# Patient Record
Sex: Male | Born: 1955
Health system: Southern US, Community
[De-identification: ages and names within clinical notes are randomized; demographics above are authoritative.]

## PROBLEM LIST (undated history)

## (undated) DIAGNOSIS — I639 Cerebral infarction, unspecified: Secondary | ICD-10-CM

## (undated) DIAGNOSIS — I4892 Unspecified atrial flutter: Secondary | ICD-10-CM

## (undated) DIAGNOSIS — D759 Disease of blood and blood-forming organs, unspecified: Secondary | ICD-10-CM

## (undated) DIAGNOSIS — E785 Hyperlipidemia, unspecified: Secondary | ICD-10-CM

## (undated) DIAGNOSIS — R42 Dizziness and giddiness: Secondary | ICD-10-CM

## (undated) DIAGNOSIS — I4891 Unspecified atrial fibrillation: Secondary | ICD-10-CM

## (undated) DIAGNOSIS — I10 Essential (primary) hypertension: Secondary | ICD-10-CM

## (undated) DIAGNOSIS — I2510 Atherosclerotic heart disease of native coronary artery without angina pectoris: Secondary | ICD-10-CM

## (undated) DIAGNOSIS — I209 Angina pectoris, unspecified: Secondary | ICD-10-CM

## (undated) DIAGNOSIS — I739 Peripheral vascular disease, unspecified: Secondary | ICD-10-CM

## (undated) HISTORY — DX: Hyperlipidemia, unspecified: E78.5

## (undated) HISTORY — DX: Atherosclerotic heart disease of native coronary artery without angina pectoris: I25.10

## (undated) HISTORY — DX: Peripheral vascular disease, unspecified: I73.9

## (undated) HISTORY — DX: Gilbert syndrome: E80.4

---

## 2002-06-05 HISTORY — PX: ACHILLES TENDON REPAIR: SUR1153

## 2003-09-10 ENCOUNTER — Ambulatory Visit (HOSPITAL_COMMUNITY): Admission: RE | Admit: 2003-09-10 | Discharge: 2003-09-10 | Payer: Self-pay | Admitting: Family Medicine

## 2005-09-05 DIAGNOSIS — I4892 Unspecified atrial flutter: Secondary | ICD-10-CM

## 2005-09-05 HISTORY — DX: Unspecified atrial flutter: I48.92

## 2005-09-05 HISTORY — PX: CARDIOVERSION: SHX1299

## 2007-09-11 ENCOUNTER — Emergency Department (HOSPITAL_COMMUNITY): Admission: EM | Admit: 2007-09-11 | Discharge: 2007-09-11 | Payer: Self-pay | Admitting: Emergency Medicine

## 2008-07-25 ENCOUNTER — Emergency Department (HOSPITAL_COMMUNITY): Admission: EM | Admit: 2008-07-25 | Discharge: 2008-07-25 | Payer: Self-pay | Admitting: Emergency Medicine

## 2008-07-26 ENCOUNTER — Ambulatory Visit (HOSPITAL_COMMUNITY): Admission: RE | Admit: 2008-07-26 | Discharge: 2008-07-26 | Payer: Self-pay | Admitting: Emergency Medicine

## 2008-10-31 ENCOUNTER — Observation Stay (HOSPITAL_COMMUNITY): Admission: EM | Admit: 2008-10-31 | Discharge: 2008-11-05 | Payer: Self-pay | Admitting: Emergency Medicine

## 2008-10-31 ENCOUNTER — Ambulatory Visit: Payer: Self-pay | Admitting: Cardiology

## 2008-11-19 ENCOUNTER — Ambulatory Visit: Payer: Self-pay | Admitting: Cardiology

## 2008-12-19 ENCOUNTER — Ambulatory Visit: Payer: Self-pay | Admitting: Cardiology

## 2008-12-25 ENCOUNTER — Ambulatory Visit: Payer: Self-pay | Admitting: Cardiology

## 2009-01-01 ENCOUNTER — Ambulatory Visit: Payer: Self-pay | Admitting: Cardiology

## 2009-01-08 ENCOUNTER — Ambulatory Visit: Payer: Self-pay | Admitting: Cardiology

## 2009-01-22 ENCOUNTER — Ambulatory Visit: Payer: Self-pay | Admitting: Cardiology

## 2009-01-29 ENCOUNTER — Ambulatory Visit: Payer: Self-pay | Admitting: Cardiology

## 2009-02-10 ENCOUNTER — Ambulatory Visit: Payer: Self-pay | Admitting: Cardiology

## 2009-02-10 ENCOUNTER — Encounter: Payer: Self-pay | Admitting: Physician Assistant

## 2009-02-10 DIAGNOSIS — E119 Type 2 diabetes mellitus without complications: Secondary | ICD-10-CM | POA: Insufficient documentation

## 2009-02-10 DIAGNOSIS — I4891 Unspecified atrial fibrillation: Secondary | ICD-10-CM | POA: Insufficient documentation

## 2009-02-10 DIAGNOSIS — E785 Hyperlipidemia, unspecified: Secondary | ICD-10-CM | POA: Insufficient documentation

## 2009-02-10 DIAGNOSIS — I6522 Occlusion and stenosis of left carotid artery: Secondary | ICD-10-CM | POA: Insufficient documentation

## 2009-02-11 ENCOUNTER — Telehealth: Payer: Self-pay | Admitting: Cardiology

## 2009-02-12 ENCOUNTER — Encounter (INDEPENDENT_AMBULATORY_CARE_PROVIDER_SITE_OTHER): Payer: Self-pay | Admitting: *Deleted

## 2009-02-17 ENCOUNTER — Ambulatory Visit (HOSPITAL_COMMUNITY): Admission: RE | Admit: 2009-02-17 | Discharge: 2009-02-17 | Payer: Self-pay | Admitting: Cardiology

## 2009-02-17 ENCOUNTER — Encounter: Payer: Self-pay | Admitting: Cardiology

## 2009-02-17 ENCOUNTER — Ambulatory Visit: Payer: Self-pay | Admitting: Internal Medicine

## 2009-02-17 ENCOUNTER — Encounter (INDEPENDENT_AMBULATORY_CARE_PROVIDER_SITE_OTHER): Payer: Self-pay | Admitting: *Deleted

## 2009-02-17 LAB — CONVERTED CEMR LAB
CO2: 24 meq/L
Calcium: 8.8 mg/dL
Sodium: 142 meq/L

## 2009-04-06 ENCOUNTER — Encounter: Payer: Self-pay | Admitting: Cardiology

## 2009-04-20 ENCOUNTER — Encounter: Payer: Self-pay | Admitting: *Deleted

## 2009-05-27 ENCOUNTER — Encounter (INDEPENDENT_AMBULATORY_CARE_PROVIDER_SITE_OTHER): Payer: Self-pay | Admitting: Cardiology

## 2009-09-29 ENCOUNTER — Telehealth (INDEPENDENT_AMBULATORY_CARE_PROVIDER_SITE_OTHER): Payer: Self-pay | Admitting: *Deleted

## 2009-09-30 ENCOUNTER — Ambulatory Visit: Payer: Self-pay | Admitting: Cardiology

## 2009-09-30 ENCOUNTER — Encounter (INDEPENDENT_AMBULATORY_CARE_PROVIDER_SITE_OTHER): Payer: Self-pay | Admitting: *Deleted

## 2009-09-30 LAB — CONVERTED CEMR LAB: POC INR: 1.2

## 2009-10-12 ENCOUNTER — Encounter (INDEPENDENT_AMBULATORY_CARE_PROVIDER_SITE_OTHER): Payer: Self-pay | Admitting: Cardiology

## 2009-10-29 ENCOUNTER — Encounter (INDEPENDENT_AMBULATORY_CARE_PROVIDER_SITE_OTHER): Payer: Self-pay | Admitting: Cardiology

## 2009-12-30 ENCOUNTER — Encounter (INDEPENDENT_AMBULATORY_CARE_PROVIDER_SITE_OTHER): Payer: Self-pay | Admitting: Pharmacist

## 2010-01-25 ENCOUNTER — Encounter (INDEPENDENT_AMBULATORY_CARE_PROVIDER_SITE_OTHER): Payer: Self-pay | Admitting: *Deleted

## 2010-03-03 ENCOUNTER — Encounter (INDEPENDENT_AMBULATORY_CARE_PROVIDER_SITE_OTHER): Payer: Self-pay | Admitting: *Deleted

## 2010-03-04 ENCOUNTER — Encounter (INDEPENDENT_AMBULATORY_CARE_PROVIDER_SITE_OTHER): Payer: Self-pay | Admitting: Pharmacist

## 2010-03-09 ENCOUNTER — Encounter (INDEPENDENT_AMBULATORY_CARE_PROVIDER_SITE_OTHER): Payer: Self-pay | Admitting: *Deleted

## 2010-04-07 ENCOUNTER — Encounter (INDEPENDENT_AMBULATORY_CARE_PROVIDER_SITE_OTHER): Payer: Self-pay | Admitting: Pharmacist

## 2010-04-21 ENCOUNTER — Emergency Department (HOSPITAL_COMMUNITY): Admission: EM | Admit: 2010-04-21 | Discharge: 2010-04-21 | Payer: Self-pay | Admitting: Emergency Medicine

## 2010-04-21 ENCOUNTER — Encounter (INDEPENDENT_AMBULATORY_CARE_PROVIDER_SITE_OTHER): Payer: Self-pay | Admitting: Pharmacist

## 2010-04-26 ENCOUNTER — Emergency Department (HOSPITAL_COMMUNITY): Admission: EM | Admit: 2010-04-26 | Discharge: 2010-04-26 | Payer: Self-pay | Admitting: Emergency Medicine

## 2010-05-19 ENCOUNTER — Encounter (INDEPENDENT_AMBULATORY_CARE_PROVIDER_SITE_OTHER): Payer: Self-pay | Admitting: Pharmacist

## 2010-08-20 ENCOUNTER — Emergency Department (HOSPITAL_COMMUNITY)
Admission: EM | Admit: 2010-08-20 | Discharge: 2010-08-20 | Payer: Self-pay | Source: Home / Self Care | Admitting: Emergency Medicine

## 2010-09-30 ENCOUNTER — Ambulatory Visit: Admit: 2010-09-30 | Payer: Self-pay | Admitting: Cardiology

## 2010-09-30 ENCOUNTER — Ambulatory Visit: Admit: 2010-09-30 | Payer: Self-pay | Admitting: Adult Health

## 2010-10-01 ENCOUNTER — Encounter (INDEPENDENT_AMBULATORY_CARE_PROVIDER_SITE_OTHER): Payer: Self-pay | Admitting: *Deleted

## 2010-10-05 NOTE — Letter (Signed)
Summary: Appointment - Missed  [facility] [place]  [address], [place] [address]   Phone: [phone]  Fax: [phone]     Jan 25, 2010 MRN: [id number]   [name] [address] [place], [place]  [address]   Dear [name],  Our records indicate you missed your appointment on        01/25/10                with [facility]IN [facility]     .                                    It is very important that we reach you to reschedule this appointment. We look forward to participating in your health care needs. Please contact us at the number listed above at your earliest convenience to reschedule this appointment.     Sincerely,    Glass blower/designer

## 2010-10-05 NOTE — Progress Notes (Signed)
Summary: HR 170/66  Phone Note Call from Patient Call back at Home Phone [phone]   Caller: pt Reason for Call: Talk to Nurse Summary of Call: pt HR was 170/66 last night got sent home from work and would like to be seen today. Initial call taken by: Faythe Ghee,  September 29, 2009 9:24 AM  Follow-up for Phone Call        pt has missed his 1 week follow up in June 2010, has been having increased bp hr 80-100 irregular, wanted to be seen today, scheduled an appt and told pt to go see pcp today. Follow-up by: Teressa Lower RN,  September 29, 2009 10:12 AM

## 2010-10-05 NOTE — Letter (Signed)
Summary: Appointment - Missed  [facility] [place]  [address] [place] [address]   Phone: [phone]  Fax: [phone]     March 03, 2010 MRN: [id number]   [name] [address] [place], [place]  [address]   Dear [name],  Our records indicate you missed your appointment on     03/03/10 COU[facility]IN CLINIC     It is very important that we reach you to reschedule this appointment. We look forward to participating in your health care needs. Please contact us at the number listed above at your earliest convenience to reschedule this appointment.     Sincerely,    Glass blower/designer

## 2010-10-05 NOTE — Letter (Signed)
Summary: Custom - Delinquent Coumadin 1  [facility]  [address] [place] [address]   Phone: [phone]  Fax: [phone]     October 29, 2009 MRN: [id number]   [name] [address] [place], [place]  [address]   Dear [name],  This letter is being sent to you as a reminder that it is necessary for you to get your INR/PT checked regularly so that we can optimize your care.  Our records indicate that you were scheduled to have a test done recently.  As of today, we have not received the results of this test.  It is very important that you have your INR checked.  Please call our office at the number listed above to schedule an appointment at your earliest convenience.    If you have recently had your protime checked or have discontinued this medication, please contact our office at the above phone number to clarify this issue.  Thank you for this prompt attention to this important health care matter.  Sincerely, [name] RN   [facility]

## 2010-10-05 NOTE — Letter (Signed)
Summary: Custom - Delinquent Coumadin 2  [facility]  [address] [place] [address]   Phone: [phone]  Fax: [phone]     March 04, 2010 MRN: [id number]   LADD CEN [address] [place], [place]  [address]   Dear Mr. Rogowski,  We have attempted to contact you by phone and letter on multiple occasions to contact our office for important blood work associated with the blood thinner, warfarin (Coumadin).  Warfarin is a very important drug that can cause life threatening side effects including, bleeding, and thus requires close laboratory monitoring.  We are unable to accept responsibility for blood thinner-related health problems you may develop because you have not followed our recommendations for appropriate monitoring.  These may include abnormal bleeding occurrences and/or development of blood clots (stroke, heart attack, blood clots in legs or lungs, etc.).  We need for you to contact this office at the number listed above to schedule and complete this very important blood work.  Thank you for your assistance in this urgent matter.  Sincerely, [name] RN [facility]

## 2010-10-05 NOTE — Letter (Signed)
Summary: Custom - Delinquent Coumadin 1  [facility]  [address] [place] [address]   Phone: [phone]  Fax: [phone]     October 12, 2009 MRN: [id number]   [name] [address] [place], [place]  [address]   Dear Mr. Findlay,  This letter is being sent to you as a reminder that it is necessary for you to get your INR/PT checked regularly so that we can optimize your care.  Our records indicate that you were scheduled to have a test done recently.  As of today, we have not received the results of this test.  It is very important that you have your INR checked.  Please call our office at the number listed above to schedule an appointment at your earliest convenience.    If you have recently had your protime checked or have discontinued this medication, please contact our office at the above phone number to clarify this issue.  Thank you for this prompt attention to this important health care matter.  Sincerely, [name] RN  [facility]

## 2010-10-05 NOTE — Letter (Signed)
Summary: Custom - Delinquent Coumadin 2  [facility]  [address] [place] [address]   Phone: [phone]  Fax: [phone]     April 21, 2010 MRN: [id number]   [name] [address] [place], [place]  [address]   Dear [name],  We have attempted to contact you by phone and letter on multiple occasions to contact our office for important blood work associated with the blood thinner, warfarin (Coumadin).  Warfarin is a very important drug that can cause life threatening side effects including, bleeding, and thus requires close laboratory monitoring.  We are unable to accept responsibility for blood thinner-related health problems you may develop because you have not followed our recommendations for appropriate monitoring.  These may include abnormal bleeding occurrences and/or development of blood clots (stroke, heart attack, blood clots in legs or lungs, etc.).  We need for you to contact this office at the number listed above to schedule and complete this very important blood work.  Thank you for your assistance in this urgent matter.  Sincerely, [name], RN [facility]

## 2010-10-05 NOTE — Letter (Signed)
Summary: Custom - Delinquent Coumadin 2  [facility]  [address], [place] [address]   Phone: [phone]  Fax: [phone]     December 30, 2009 MRN: [id number]   [name] [address] [place], [place]  [address]   Dear [name],  We have attempted to contact you by phone and letter on multiple occasions to contact our office for important blood work associated with the blood thinner, warfarin (Coumadin).  Warfarin is a very important drug that can cause life threatening side effects including, bleeding, and thus requires close laboratory monitoring.  We are unable to accept responsibility for blood thinner-related health problems you may develop because you have not followed our recommendations for appropriate monitoring.  These may include abnormal bleeding occurrences and/or development of blood clots (stroke, heart attack, blood clots in legs or lungs, etc.).  We need for you to contact this office at the number listed above to schedule and complete this very important blood work.  Thank you for your assistance in this urgent matter.  Sincerely, [name], RN [facility]  If you want [place] to continue to refill your coumadin, you must come in for regular INR checks.

## 2010-10-05 NOTE — Medication Information (Signed)
Summary: protime/pt last seen 8/10/tg  Anticoagulant Therapy  Managed by: [name], RN PCP: Daryl Eastern MD: Diona Browner MD, Remi Deter Indication 1: Atrial Fibrillation (ICD-427.31) Lab Used: [facility] Site: [place] INR POC 1.2  Dietary changes: no    Health status changes: no    Bleeding/hemorrhagic complications: no    Recent/future hospitalizations: no    Any changes in medication regimen? no    Recent/future dental: no  Any missed doses?: no       Is patient compliant with meds? no     Details: Has not had coumadin checked since 8/11     Comments: Pt states he has been taking coumadin 20mg  once daily but INR 1.2.  After questioning pt he admits to missing doses and not taking regularly.  In looking back at [facility] record his INR was never within range before.  Have counseled pt on improtance of taking coumadin regularly and having regular INR checks.  Food and drug interactions discussed with pt as well.  Literature re. caoumadin management given to pt.  Allergies: No Known Drug Allergies  Anticoagulation Management History:      The patient is taking warfarin and comes in today for a routine follow up visit.  Positive risk factors for bleeding include presence of serious comorbidities.  Negative risk factors for bleeding include an age less than 21 years old.  The bleeding index is 'intermediate risk'.  Positive CHADS2 values include History of Diabetes.  Negative CHADS2 values include Age > 63 years old.  The start date was 09/29/2008.  Anticoagulation responsible provider: Diona Browner MD, Remi Deter.  INR POC: 1.2.  Cuvette Lot#: [id number].    Anticoagulation Management Assessment/Plan:      The patient's current anticoagulation dose is Warfarin sodium 10 mg tabs: as directed.  The target INR is 2 - 3.  The next INR is due 10/08/2009.  Anticoagulation instructions were given to patient.  Results were reviewed/authorized by [name],  RN.  He was notified by [name] RN.         Prior Anticoagulation Instructions: 15 mg daily  Current Anticoagulation Instructions: INR 1.2 Pt has been non-compliant Will start pt from scratch since I do not know how pt has been taking coumadin Take couamdin 1 1/2 tablets once daily  Recheck in 1 week Prescriptions: WARFARIN SODIUM 10 MG TABS (WARFARIN SODIUM) as directed  #60 x 3   Entered by:   [name] RN   Authorized by:   Loreli Slot, MD, [facility]   Signed by:   [name] RN on 09/30/2009   Method used:   Electronically to        [facility]   [address] (retail)       [address]       [place], [place]  [address]       Ph: [phone]       Fax: [phone]   RxID:   [id number]

## 2010-10-05 NOTE — Letter (Signed)
Summary: Appointment - Missed   [facility]  [address], [place] [address]   Phone: [phone]  Fax: [phone]     March 09, 2010 MRN: [id number]   [name] [address] [place], [place]  [address]   Dear [name],  Our records indicate you missed your appointment on     03/09/10 [name] NP                It is very important that we reach you to reschedule this appointment. We look forward to participating in your health care needs. Please contact us at the number listed above at your earliest convenience to reschedule this appointment.     Sincerely,    Glass blower/designer

## 2010-10-05 NOTE — Letter (Signed)
Summary: Custom - Delinquent Coumadin 2  [facility]  [address] [place] [address]   Phone: [phone]  Fax: [phone]     April 07, 2010 MRN: [id number]   ACESON LABELL [address] [place], [place]  [address]   Dear Mr. Vignola,  We have attempted to contact you by phone and letter on multiple occasions to contact our office for important blood work associated with the blood thinner, warfarin (Coumadin).  Warfarin is a very important drug that can cause life threatening side effects including, bleeding, and thus requires close laboratory monitoring.  We are unable to accept responsibility for blood thinner-related health problems you may develop because you have not followed our recommendations for appropriate monitoring.  These may include abnormal bleeding occurrences and/or development of blood clots (stroke, heart attack, blood clots in legs or lungs, etc.).  We need for you to contact this office at the number listed above to schedule and complete this very important blood work.  Thank you for your assistance in this urgent matter.  Sincerely, [name] RN [facility]   PLEASE CALL OUR OFFICE REGARDING THIS ISSUE.

## 2010-10-05 NOTE — Letter (Signed)
Summary: Custom - Delinquent Coumadin 2  [facility]  [address] [place] [address]   Phone: [phone]  Fax: [phone]     May 19, 2010 MRN: [id number]   [name] [address] [place], [place]  [address]   Dear Mr. Patrie,  We have attempted to contact you by phone and letter on multiple occasions to contact our office for important blood work associated with the blood thinner, warfarin (Coumadin).  Warfarin is a very important drug that can cause life threatening side effects including, bleeding, and thus requires close laboratory monitoring.  We are unable to accept responsibility for blood thinner-related health problems you may develop because you have not followed our recommendations for appropriate monitoring.  These may include abnormal bleeding occurrences and/or development of blood clots (stroke, heart attack, blood clots in legs or lungs, etc.).  We need for you to contact this office at the number listed above to schedule and complete this very important blood work.  Thank you for your assistance in this urgent matter.  Sincerely, [name] RN [facility]

## 2010-10-05 NOTE — Miscellaneous (Signed)
Summary: LABS BMP,02/17/2009  Clinical Lists Changes  Observations: Added new observation of CALCIUM: 8.8 mg/dL (16/06/9603 54:09) Added new observation of CREATININE: 1.08 mg/dL (81/19/1478 29:56) Added new observation of BUN: 15 mg/dL (21/30/8657 84:69) Added new observation of BG RANDOM: 95 mg/dL (62/95/2841 32:44) Added new observation of CO2 PLSM/SER: 24 meq/L (02/17/2009 10:15) Added new observation of CL SERUM: 109 meq/L (02/17/2009 10:15) Added new observation of K SERUM: 4.5 meq/L (02/17/2009 10:15) Added new observation of NA: 142 meq/L (02/17/2009 10:15)

## 2010-10-07 NOTE — Letter (Signed)
Summary: Appointment - Missed  [facility] [place]  [address] [place] [address]   Phone: [phone]  Fax: [phone]     October 01, 2010 MRN: [id number]   [name] [address] [place], [place]  [address]   Dear [name],  Our records indicate you missed your appointment on     09/30/10 [name] NP                It is very important that we reach you to reschedule this appointment. We look forward to participating in your health care needs. Please contact us at the number listed above at your earliest convenience to reschedule this appointment.     Sincerely,    Glass blower/designer

## 2010-11-15 LAB — PROTIME-INR: INR: 1.02 (ref 0.00–1.49)

## 2010-11-15 LAB — DIFFERENTIAL
Basophils Relative: 0 % (ref 0–1)
Eosinophils Absolute: 0.1 10*3/uL (ref 0.0–0.7)
Eosinophils Relative: 1 % (ref 0–5)
Lymphs Abs: 1.8 10*3/uL (ref 0.7–4.0)
Monocytes Relative: 7 % (ref 3–12)
Neutrophils Relative %: 74 % (ref 43–77)

## 2010-11-15 LAB — URINALYSIS, ROUTINE W REFLEX MICROSCOPIC
Nitrite: NEGATIVE
Protein, ur: NEGATIVE mg/dL
Specific Gravity, Urine: 1.025 (ref 1.005–1.030)
Urobilinogen, UA: 1 mg/dL (ref 0.0–1.0)

## 2010-11-15 LAB — BASIC METABOLIC PANEL
CO2: 28 mEq/L (ref 19–32)
Calcium: 10.1 mg/dL (ref 8.4–10.5)
Chloride: 106 mEq/L (ref 96–112)
Creatinine, Ser: 1.28 mg/dL (ref 0.4–1.5)
Glucose, Bld: 87 mg/dL (ref 70–99)

## 2010-11-15 LAB — CBC
Hemoglobin: 16.5 g/dL (ref 13.0–17.0)
MCH: 31.2 pg (ref 26.0–34.0)
MCHC: 35.3 g/dL (ref 30.0–36.0)
MCV: 88.5 fL (ref 78.0–100.0)
Platelets: 219 10*3/uL (ref 150–400)

## 2010-11-15 LAB — GLUCOSE, CAPILLARY: Glucose-Capillary: 95 mg/dL (ref 70–99)

## 2010-11-15 LAB — POCT CARDIAC MARKERS: Myoglobin, poc: 119 ng/mL (ref 12–200)

## 2010-11-17 ENCOUNTER — Encounter: Payer: Self-pay | Admitting: Pharmacist

## 2010-11-19 LAB — PROTIME-INR
INR: 1.09 (ref 0.00–1.49)
Prothrombin Time: 14.3 seconds (ref 11.6–15.2)

## 2010-11-23 ENCOUNTER — Encounter: Payer: Self-pay | Admitting: Adult Health

## 2010-11-23 ENCOUNTER — Other Ambulatory Visit: Payer: Self-pay | Admitting: Adult Health

## 2010-11-23 ENCOUNTER — Ambulatory Visit (INDEPENDENT_AMBULATORY_CARE_PROVIDER_SITE_OTHER): Payer: BC Managed Care – PPO | Admitting: Adult Health

## 2010-11-23 DIAGNOSIS — Z7901 Long term (current) use of anticoagulants: Secondary | ICD-10-CM

## 2010-11-23 DIAGNOSIS — E119 Type 2 diabetes mellitus without complications: Secondary | ICD-10-CM

## 2010-11-23 DIAGNOSIS — R609 Edema, unspecified: Secondary | ICD-10-CM

## 2010-11-23 DIAGNOSIS — I4891 Unspecified atrial fibrillation: Secondary | ICD-10-CM

## 2010-11-23 DIAGNOSIS — E785 Hyperlipidemia, unspecified: Secondary | ICD-10-CM

## 2010-11-23 MED ORDER — WARFARIN SODIUM 10 MG PO TABS: 10.0000 mg | ORAL_TABLET | Freq: Every day | ORAL | Status: DC

## 2010-11-23 MED ORDER — FUROSEMIDE 20 MG PO TABS: 20.0000 mg | ORAL_TABLET | Freq: Every day | ORAL | Status: DC

## 2010-11-23 MED ORDER — METOPROLOL SUCCINATE ER 50 MG PO TB24: 50.0000 mg | ORAL_TABLET | Freq: Two times a day (BID) | ORAL | Status: DC

## 2010-11-23 NOTE — Assessment & Plan Note (Signed)
Heart rate is not well controlled at present.  Will increase his beta blocker only, to metoprolol 50 mg 1 1/2 tables BID. He will have follow-up echo cardiogram and stress myoview secondary to multiple CVRF.  He wishes to start back on coumadin.  Will begin at 10mg  daily with follow-up at the end of the week in coumadin clinic.

## 2010-11-23 NOTE — Assessment & Plan Note (Signed)
Will check fasting lipids and LFT's with labs ordered.  He is advised on low cholesterol diet and to increase exercise to lose weight

## 2010-11-23 NOTE — Letter (Signed)
Summary: Custom - Delinquent Coumadin 2  [facility]  [address], [place] [address]   Phone: [phone]  Fax: [phone]     November 17, 2010 MRN: [id number]   [name] [address] [place], [place]  [address]   Dear Mr. Hartsock,  We have attempted to contact you by phone and letter on multiple occasions to contact our office for important blood work associated with the blood thinner, warfarin (Coumadin).  Warfarin is a very important drug that can cause life threatening side effects including, bleeding, and thus requires close laboratory monitoring.  We are unable to accept responsibility for blood thinner-related health problems you may develop because you have not followed our recommendations for appropriate monitoring.  These may include abnormal bleeding occurrences and/or development of blood clots (stroke, heart attack, blood clots in legs or lungs, etc.).  We need for you to contact this office at the number listed above to schedule and complete this very important blood work.  Thank you for your assistance in this urgent matter.  Sincerely, [name], RN  [facility]

## 2010-11-23 NOTE — Progress Notes (Signed)
[name] is a 55 y/o mobidly obese AAM with known history of Atrial fib, hypertension, diabetes and hyperlipidemia who presents today after not being seen for 18 months.  He comes today stating that he is having increasing DOE, fatigue, fluid retention. He has not been medically complaint and is only taking metoprolol.  He stopped taking coumadin and diltiazem over 5  Months ago because of "family issues.'  He wants to get back on track and be reestablished in our clinic.  He has no complaints of chest pain, but is noticing more symptoms of lack of stamina.  He also feels his heart racing more.  Review of systems complete and found to be negative unless listed above  General: Well developed, well nourished, in no acute distress Head: Eyes PERRLA, No xanthomas.   Normal cephalic and atramatic  Lungs: Clear bilaterally to auscultation and percussion. Heart:  Irregular rhythm tachycardic with 1/6 systolic murmur.  Pulses are 2+ & equal.            No carotid bruits. Mild JVD.  No abdominal bruits. No femoral bruits. Abdomen: Bowel sounds are positive, abdomen soft and non-tender without masses or                  Hernia's noted. Obese, no ascities Msk:  Back normal, normal gait. Normal strength and tone for age. Extremities: Bilateral 2+ edema in the lower extremities Neuro: Alert and oriented X 3. Psych: Good affect

## 2010-11-23 NOTE — Assessment & Plan Note (Signed)
Will begin low dose lasix 20mg  daily and follow-up on labs.  He will see [place] after tests are completed.

## 2010-11-25 ENCOUNTER — Encounter: Payer: Self-pay | Admitting: *Deleted

## 2010-11-25 ENCOUNTER — Ambulatory Visit (INDEPENDENT_AMBULATORY_CARE_PROVIDER_SITE_OTHER): Payer: BC Managed Care – PPO | Admitting: *Deleted

## 2010-11-25 DIAGNOSIS — Z7901 Long term (current) use of anticoagulants: Secondary | ICD-10-CM

## 2010-11-25 DIAGNOSIS — I4891 Unspecified atrial fibrillation: Secondary | ICD-10-CM

## 2010-11-29 ENCOUNTER — Ambulatory Visit (HOSPITAL_COMMUNITY)
Admission: RE | Admit: 2010-11-29 | Discharge: 2010-11-29 | Disposition: A | Discharge status: Home / Self Care | Payer: BC Managed Care – PPO | Source: Ambulatory Visit | Attending: Adult Health | Admitting: Adult Health

## 2010-11-29 ENCOUNTER — Other Ambulatory Visit: Payer: Self-pay | Admitting: Cardiology

## 2010-11-29 DIAGNOSIS — E119 Type 2 diabetes mellitus without complications: Secondary | ICD-10-CM | POA: Insufficient documentation

## 2010-11-29 DIAGNOSIS — I059 Rheumatic mitral valve disease, unspecified: Secondary | ICD-10-CM

## 2010-11-29 DIAGNOSIS — I10 Essential (primary) hypertension: Secondary | ICD-10-CM | POA: Insufficient documentation

## 2010-11-29 DIAGNOSIS — I4891 Unspecified atrial fibrillation: Secondary | ICD-10-CM | POA: Insufficient documentation

## 2010-12-09 ENCOUNTER — Encounter: Payer: BC Managed Care – PPO | Admitting: *Deleted

## 2010-12-09 ENCOUNTER — Encounter: Payer: Self-pay | Admitting: *Deleted

## 2010-12-16 LAB — GLUCOSE, CAPILLARY
Glucose-Capillary: 107 mg/dL — ABNORMAL HIGH (ref 70–99)
Glucose-Capillary: 158 mg/dL — ABNORMAL HIGH (ref 70–99)
Glucose-Capillary: 90 mg/dL (ref 70–99)
Glucose-Capillary: 96 mg/dL (ref 70–99)

## 2010-12-16 LAB — TSH: TSH: 2.653 u[IU]/mL (ref 0.350–4.500)

## 2010-12-16 LAB — PROTIME-INR
INR: 1.1 (ref 0.00–1.49)
Prothrombin Time: 14.2 seconds (ref 11.6–15.2)

## 2010-12-21 LAB — BASIC METABOLIC PANEL
CO2: 25 mEq/L (ref 19–32)
Calcium: 9.4 mg/dL (ref 8.4–10.5)
Calcium: 9.5 mg/dL (ref 8.4–10.5)
Chloride: 108 mEq/L (ref 96–112)
GFR calc Af Amer: >60 mL/min (ref >60)
GFR calc Af Amer: >60 mL/min (ref >60)
GFR calc non Af Amer: 57 mL/min — ABNORMAL LOW (ref >60)
Glucose, Bld: 87 mg/dL (ref 70–99)
Sodium: 137 mEq/L (ref 135–145)
Sodium: 138 mEq/L (ref 135–145)

## 2010-12-21 LAB — DIFFERENTIAL
Basophils Relative: 0 % (ref 0–1)
Lymphs Abs: 1.5 10*3/uL (ref 0.7–4.0)
Monocytes Absolute: 0.4 10*3/uL (ref 0.1–1.0)
Monocytes Relative: 5 % (ref 3–12)
Neutro Abs: 6.2 10*3/uL (ref 1.7–7.7)

## 2010-12-21 LAB — CBC
Hemoglobin: 15.8 g/dL (ref 13.0–17.0)
MCHC: 34.7 g/dL (ref 30.0–36.0)
MCV: 89.6 fL (ref 78.0–100.0)
RBC: 5.09 MIL/uL (ref 4.22–5.81)
WBC: 8.2 10*3/uL (ref 4.0–10.5)

## 2010-12-21 LAB — POCT CARDIAC MARKERS
CKMB, poc: 4 ng/mL (ref 1.0–8.0)
Myoglobin, poc: 109 ng/mL (ref 12–200)
Myoglobin, poc: 114 ng/mL (ref 12–200)

## 2010-12-21 LAB — PROTIME-INR
INR: 1.1 (ref 0.00–1.49)
INR: 1.1 (ref 0.00–1.49)
INR: 1.2 (ref 0.00–1.49)
Prothrombin Time: 15 seconds (ref 11.6–15.2)
Prothrombin Time: 15.1 seconds (ref 11.6–15.2)

## 2010-12-21 LAB — LIPID PANEL
Cholesterol: 177 mg/dL (ref 0–200)
LDL Cholesterol: 132 mg/dL — ABNORMAL HIGH (ref 0–99)

## 2010-12-21 LAB — CARDIAC PANEL(CRET KIN+CKTOT+MB+TROPI)
CK, MB: 5.7 ng/mL — ABNORMAL HIGH (ref 0.3–4.0)
Relative Index: 2.5 (ref 0.0–2.5)
Relative Index: 2.8 — ABNORMAL HIGH (ref 0.0–2.5)
Total CK: 142 U/L (ref 7–232)
Total CK: 202 U/L (ref 7–232)
Troponin I: <0.01 ng/mL (ref 0.00–0.06)

## 2010-12-21 LAB — GLUCOSE, CAPILLARY
Glucose-Capillary: 123 mg/dL — ABNORMAL HIGH (ref 70–99)
Glucose-Capillary: 82 mg/dL (ref 70–99)
Glucose-Capillary: 84 mg/dL (ref 70–99)

## 2010-12-21 LAB — HEMOGLOBIN A1C
Hgb A1c MFr Bld: 5.9 % (ref 4.6–6.1)
Mean Plasma Glucose: 123 mg/dL

## 2011-01-18 NOTE — Group Therapy Note (Signed)
NAMERAJ, LANDRESS               ACCOUNT NO.:  [id number]   MEDICAL RECORD NO.:  [id number]          PATIENT TYPE:  INP   LOCATION:  A318                          FACILITY:  [facility]   PHYSICIAN:  Skeet Latch, DO    DATE OF BIRTH:  [birth year]   DATE OF PROCEDURE:  11/03/2008  DATE OF DISCHARGE:                                 PROGRESS NOTE   SUBJECTIVE:  Mr. Chriscoe says he is feeling better. Still does not have  any chest pain, abdominal pain, or shortness of breath today.   OBJECTIVE:  GENERAL:  He is alert and awake.  No acute distress.  VITAL SIGNS:  Temperature 98.1, pulse 91, respirations 20, blood  pressure 98/67.  Oxygen saturation 95% on room air.  CARDIOVASCULAR:  S1 and S2 regular.  No rubs, gallops or murmurs.  LUNGS:  Clear.  No rhonchi, rales or wheezes.  ABDOMEN:  Soft, nontender, nondistended.  Positive bowel sounds.  EXTREMITIES:  No clubbing, cyanosis or edema.   LABORATORY DATA:  Sodium 137, potassium 4.2, chloride 106, CO2 25,  glucose 87, BUN 11, creatinine 1.32.  PT 15, INR 1.1. Hemoglobin A1c  5.9.  Last cardiac panel CK-MB 2.3, troponin less than 0.01.   ASSESSMENT/PLAN:  1. Chest pain.  Rule out myocardial infarction.  So far his cardiac      enzymes have been unremarkable.  Continue to follow.  2. History of atrial fibrillation.  The patient is on Coumadin as well      as Lovenox.  Will continue that at this time.  We are awaiting      evaluation by cardiology at this time.  3. Diabetes.  Will continue with the current treatment.  Sliding scale      and home medications.  4. The patient has history of hypertension and dyslipidemia.  Will      continue with current treatment.  5. Will await cardiology evaluation.  The patient's discharge is      pending their recommendations at this time.      Skeet Latch, DO  Electronically Signed     SM/MEDQ  D:  11/03/2008  T:  11/03/2008  Job:  [id number]

## 2011-01-18 NOTE — Group Therapy Note (Signed)
[name], [name]               ACCOUNT NO.:  [id number]   MEDICAL RECORD NO.:  [id number]           PATIENT TYPE:  INP   LOCATION:  A318                          FACILITY:  [facility]   PHYSICIAN:  Margaretmary Dys, M.D.DATE OF BIRTH:  Apr 16, 1956   DATE OF PROCEDURE:  11/02/2008  DATE OF DISCHARGE:                                 PROGRESS NOTE   SUBJECTIVE:  The patient feels better.  He denies any chest pain.  He  has no fevers or chills.  He has no shortness of breath.   The patient's cardiac enzymes have been noted to be negative.  His  telemetry continues to show atrial fibrillation with controlled rate.   OBJECTIVE:  Conscious, alert, comfortable not in acute distress, well-  oriented in time, place and person.  VITAL SIGNS:  Blood pressure is 104/65 with pulse of 89, respirations  20, temperature 99 degrees Fahrenheit, oxygen saturation was 96% on room  air.  HEENT:  Normocephalic, atraumatic.  Oral mucosa was moist with no  exudates.  NECK:  Supple, no JVD, no lymphadenopathy.  LUNGS:  Clear clinically with good air entry bilaterally.  HEART:  S1-S2 regular.  No S3, S4, gallops or rubs.  ABDOMEN:  Soft, nontender, bowel sounds positive, no masses palpable.  CNS:  Grossly intact with no focal neurological deficits.   LABORATORY DATA:  Blood sugar is 82.  Cardiac enzymes not completed and  were negative.   ASSESSMENT/PLAN:  1. Chest, rule out myocardial infarction.  Patient's cardiac enzymes      are negative.  2. Diabetes mellitus, blood sugar is well-controlled here in the      hospital.  3. History of hypertension.  4. Dyslipidemia, confirmed with elevated LDL with a low HDL in a      patient with significant multiple risk factors.   PLAN:  1. The patient will be continued on telemetry.  2. Continue on full anticoagulation with Lovenox while waiting for      Coumadin and INR to become therapeutic.  3. Request cardiology consult, put in again for tomorrow morning.      Compliance was discussed with the patient.  4. Smoking cessation was again discussed with the patient in detail.      Will continue on Zocor 20 mg for his dyslipidemia.   DISPOSITION:  Await cardiologist's evaluation in the morning for  subsequent plan.      Margaretmary Dys, M.D.     AM/MEDQ  D:  11/02/2008  T:  11/02/2008  Job:  [id number]

## 2011-01-18 NOTE — Group Therapy Note (Signed)
[name], [name]               ACCOUNT NO.:  [id number]   MEDICAL RECORD NO.:  [id number]          PATIENT TYPE:  INP   LOCATION:  A318                          FACILITY:  [facility]   PHYSICIAN:  Margaretmary Dys, M.D.DATE OF BIRTH:  [birth year]   DATE OF PROCEDURE:  11/01/2008  DATE OF DISCHARGE:                                 PROGRESS NOTE   PRIORITY PROGRESS NOTE   SUBJECTIVE:  The patient feels better today.  Denies any chest pain.  No  headaches or seizures.  No shortness of breath.   The patient's cardiac enzymes were negative.   OBJECTIVE:  GENERAL:  The patient was alert, comfortable, in no acute  distress, oriented to time, place, and person.  VITAL SIGNS:  Blood pressure is 103/74, pulse 89, respirations are 18,  temperature 98.8 degrees Fahrenheit, oxygen saturation 98% on room air.  HEENT:  Normocephalic, atraumatic, oral mucosa was dry, no exudates were  noted.  NECK:  Supple, no JVD, no lymphadenopathy.  LUNGS:  Clear to auscultation bilaterally.  HEART:  S1 S2 regularly irregular, no S3, S4, gallops, or rubs.  ABDOMEN:  Soft and nontender, bowel sounds positive, no masses palpable.  EXTREMITIES:  No edema.  CNS:  The patient was grossly active with no focal neurological deficit.   LABORATORY/DIAGNOSTIC DATA:  Cardiac enzymes are negative.  Cholesterol  level was 177, triglycerides 83, HDL was 28, LDL of 132.   ASSESSMENT AND PLAN:  1. Chest pain, rule out myocardial infarction.  The patient was ruled      out by negative cardiac enzymes.  2. History of hypertension.  3. Diabetes mellitus.  4. Dyslipidemia confirmed with elevated LDL level in a patient with      significant risk factors.   PLAN:  1. We will continue the patient on telemetry.  2. Continue on full anticoagulation with Lovenox while waiting for      Coumadin to become therapeutic.  3. We will request Cardiology consult on Monday morning for any      additional input regarding patient's chest  discomfort as it appears      to be somewhat typical, although the patient has been mostly      stable.  4. Compliance was again discussed with patient.  5. Smoking cessation was again offered as mentioned to the patient.  6. The patient will be started on Zocor for his dyslipidemia pending      Cardiology review at which time he will be continued on this or a      different medication.   DISPOSITION:  The patient will likely remain in the hospital until  Monday when the patient will be seen by Cardiology for plan regarding a  possible stress test or cardiac catheterization.      Margaretmary Dys, M.D.  Electronically Signed     AM/MEDQ  D:  11/01/2008  T:  11/02/2008  Job:  [id number]

## 2011-01-18 NOTE — Group Therapy Note (Signed)
[name], [name]               ACCOUNT NO.:  [id number]   MEDICAL RECORD NO.:  [id number]          PATIENT TYPE:  INP   LOCATION:  A318                          FACILITY:  [facility]   PHYSICIAN:  Margaretmary Dys, M.D.DATE OF BIRTH:  1955-11-18   DATE OF PROCEDURE:  11/02/2008  DATE OF DISCHARGE:                                 PROGRESS NOTE   SUBJECTIVE:  The patient feels better.  He denies any chest pain.  He  has no fevers or chills.  He has no shortness of breath.   The patient's cardiac enzymes have been noted to be negative.  His  telemetry continues to show atrial fibrillation with controlled rate.   OBJECTIVE:  Conscious, alert, comfortable not in acute distress, well-  oriented in time, place and person.  VITAL SIGNS:  Blood pressure is 104/65 with pulse of 89, respirations  20, temperature 99 degrees Fahrenheit, oxygen saturation was 96% on room  air.  HEENT:  Normocephalic, atraumatic.  Oral mucosa was moist with no  exudates.  NECK:  Supple, no JVD, no lymphadenopathy.  LUNGS:  Clear clinically with good air entry bilaterally.  HEART:  S1-S2 regular.  No S3, S4, gallops or rubs.  ABDOMEN:  Soft, nontender, bowel sounds positive, no masses palpable.  CNS:  Grossly intact with no focal neurological deficits.   LABORATORY DATA:  Blood sugar is 82.  Cardiac enzymes not completed and  were negative.   ASSESSMENT/PLAN:  1. Chest, rule out myocardial infarction.  Patient's cardiac enzymes      are negative.  2. Diabetes mellitus, blood sugar is well-controlled here in the      hospital.  3. History of hypertension.  4. Dyslipidemia, confirmed with elevated LDL with a low HDL in a      patient with significant multiple risk factors.   PLAN:  1. The patient will be continued on telemetry.  2. Continue on full anticoagulation with Lovenox while waiting for      Coumadin and INR to become therapeutic.  3. Request cardiology consult, put in again for tomorrow morning.      Compliance was discussed with the patient.  4. Smoking cessation was again discussed with the patient in detail.      Will continue on Zocor 20 mg for his dyslipidemia.   DISPOSITION:  Await cardiologist's evaluation in the morning for  subsequent plan.      Margaretmary Dys, M.D.  Electronically Signed     AM/MEDQ  D:  11/02/2008  T:  11/02/2008  Job:  [id number]

## 2011-01-18 NOTE — H&P (Signed)
[name], [name]               ACCOUNT NO.:  [id number]   MEDICAL RECORD NO.:  [id number]          PATIENT TYPE:  INP   LOCATION:  A318                          FACILITY:  [facility]   PHYSICIAN:  Margaretmary Dys, M.D.DATE OF BIRTH:  04-27-1956   DATE OF ADMISSION:  10/31/2008  DATE OF DISCHARGE:  LH                              HISTORY & PHYSICAL   ADMISSION DIAGNOSES:  1. Chest pain, rule out myocardial infarction.  2. History of diabetes mellitus, diet controlled.  3. Mild obesity.  4. Atrial fibrillation on anticoagulation.  5. Subtherapeutic INR.  6. History of dyslipidemia.  7. Probable poor compliance to medications.   CHIEF COMPLAINT:  Chest pain since yesterday.   HISTORY OF PRESENT ILLNESS:  [name] is a 55 year old African American  male who presented to the emergency room with complaints of substernal  chest pain.  The patient reports the pain to be about 5/10  at its  worst.  He reports there is no radiation.  The pain typically lasts  about 45 minutes and spontaneously resolves itself. The patient does not  think it is a heartburn pain, as he feels like someone is standing on  his chest.  The patient denies any diaphoresis.  He does have some  shortness of breath.  He denies any palpitations other than the  ordinary.  He does feel his heart fluttering sometimes, which is  suspected secondary to his atrial fibrillation.   The patient is on Coumadin due to his multiple other risk factors.  The  patient follows with [facility].  The patient reports taking his  Coumadin daily despite having a very subtherapeutic INR today.  The  patient denies any focal weakness.  No eye pains, no hearing loss.  No  fever or chills.  No pleuritic chest pain.  No nausea or vomiting.  The  patient denies any prior history of heartburn.  The patient denies any  recent history of stress although he says he has been working at the  same job for 18 years. 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   REVIEW OF SYSTEMS:  As mentioned in the history of present illness  above.  No history of joint pains or rash.  No weight loss.  No  hemoptysis.  No hallucinations.   PAST MEDICAL HISTORY:  1. Hypertension.  2. Diabetes mellitus.  3. Atrial fibrillation.  4. Dyslipidemia.  5. Atrial fibrillation.  6. Sullivan Lone syndrome.   PAST SURGICAL HISTORY:  1. Achilles tendon repair.  2. The patient is atrial fibrillation status post cardioversion, which      apparently has failed.   MEDICATIONS:  The patient is on:  1. Warfarin 12 mg p.o. once a day.  2. Metoprolol 25 mg p.o. b.i.d.  3. Digoxin 0.125 mg once a day.  Again, I am not sure of the patient's      compliance to this medicine as his digoxin level and INR are both  subtherapeutic.   ALLERGIES:  The patient reports allergy to PENICILLIN, he does get a  rash.   SOCIAL HISTORY:  The patient is married, lives with his wife and has a 32-  year-old child.  He smokes cigarettes.  He denies any illicit drug use.  He denies any significant alcohol abuse.  No recent travels.   The patient is fairly active, as he is a Programmer, systems.   PHYSICAL EXAM:  The patient was conscious, alert, comfortable, not in  acute distress, well oriented in time, place and person.  The patient's  current chest pain was zero at the time I saw him.  VITAL SIGNS:  On arrival to the emergency room, his blood pressure was  119/73 with a pulse of 89, respiration was 20, temperature 97.1 degrees  Fahrenheit, oxygen saturation was 99% on room air.  HEENT: Exam normocephalic, atraumatic.  Oral mucosa was moist with no  exudates.  NECK:  Supple.  No JVD or lymphadenopathy.  LUNGS:  Reduced air entry bilaterally.  Occasional crackles at the  bases.  HEART: S1-S2 irregularly irregular.  No S3, gallops or rubs.  ABDOMEN:  Soft,  nontender.  Bowel sounds positive.  No masses palpable.  EXTREMITIES: No edema.  No calf induration or tenderness was noted.  CNS:  Exam was grossly intact with no focal neurological deficits.   LABORATORY AND DIAGNOSTIC DATA:  White blood cell count 8.2, hemoglobin  of 15.8, hematocrit 46, platelet count was 173 with no left shift.  PT  14.2, INR was 1.1, blood sugar was 123.  Sodium is 138, potassium 4.1,  chloride 108, CO2 of 25, glucose 90, BUN of 16, creatinine 1, calcium  9.4.  Initial cardiac markers: CK-MB was 5.7 and his relative index was  2.8, they were not significantly elevated.  Troponin was negative.  Digoxin level was also less than 0.2.   Chest x-ray shows no acute cardiopulmonary abnormalities.   A 12-lead EKG shows atrial fibrillation with controlled rate.   ASSESSMENT/PLAN:  This is a 55 year old male with multiple medical  problems as mentioned above who presents with chest pain, substernal,  pressure-like.  The patient's chest pain is fairly typical.  He reports  having a stress test done in 2007, maybe last year, he is not sure, but  remembers walking a treadmill and he was told that this was negative.  The patient has other multiple risk factors including history of  dyslipidemia, diabetes, hypertension and he is a black male.  At this  time, the patient will be admitted to the medical floor.  1. The patient will be placed on telemetry.  2. The patient will be monitored very closely.  If he has a recurrence      of chest pain, I will start him on intravenous nitroglycerin and      also heparin infusion.  I will also call the cardiologist on call      at [facility] to see if the patient can be urgently transferred for      possible intervention.  3. We will obtain a fasting lipid profile in the morning.  4. We will resume all of the patient's medications.  5. I will start the patient on Lovenox 1 mg/kg subcutaneous twice a      day possibly for unstable angina and  also for his atrial      fibrillation as patient is not therapeutic an, even though he is      only 55 years  old, he has significant risk factors that may      potentially cause an ischemic cerebrovascular event.  6. The patient was put on Protonix 40 mg twice a day.   DISPOSITION:  I have explained the above plan to the patient and  indicated he may need to remain in the hospital until seen by Cardiology  on Monday for possible stress test.  I also informed him that he may be  transferred emergently to [facility] if his chest pain recurs.  The  patient verbalized full understanding.      Margaretmary Dys, M.D.  Electronically Signed     AM/MEDQ  D:  11/01/2008  T:  11/01/2008  Job:  [id number]

## 2011-01-18 NOTE — Assessment & Plan Note (Signed)
[facility]                       [facility] OFFICE NOTE   [name], [name]                      MRN:          [id number]  DATE:11/19/2008                            DOB:          [birth year]    CARDIOLOGIST:  Dr. Gerrit Friends. Dietrich Pates, MD, [facility].   PRIMARY CARE PHYSICIAN:  Dr. Casilda Carls at [facility].   REASON FOR VISIT:  Posthospitalization followup.   HISTORY OF PRESENT ILLNESS:  [name] is a 55 year old male patient  with a history of paroxysmal/persistent atrial fibrillation on Coumadin  therapy since 2007 who recently presented to [facility] with  shortness of breath and chest discomfort.  He was noted to be in atrial  fibrillation with rapid heart rate of 124.  He was admitted to [facility] for further evaluation.  I saw him with Dr. Dietrich Pates.  His  INR at that time was significantly depressed on a high dose of Coumadin.  We adjusted his Coumadin and he is having it followed at [facility].  He was taken off his digoxin as it was felt that this  was not providing much benefit.  We placed him on diltiazem and adjusted  his beta-blocker therapy.  Initially, it was thought he may be suffering  from fatigue secondary to beta-blocker therapy.  However, his rate was  elevated and required beta-blocker.  His only thromboembolic risk  factors seem to be diabetes mellitus.  However, we kept him on Coumadin  in case he would require cardioversion in the future.  He was eventually  discharged home.  Of note, his cholesterol was abnormal in the hospital  with an LDL of 132, but he was not taking a statin.  He is back on that  now.  His TSH was normal 2.653.  In the office today, he notes he has  had a upper respiratory tract infection since he was discharged from the  hospital.  This is improving.  He was having significant amount of cough  and congestion.  He noted some chest tightness  with cough.  This seems  to all be resolving.  He denies any significant fevers or purulent  sputum.  He notes shortness of breath with exertion.  He describes  probable NYHA class II symptoms.  He denies orthopnea or PND.  He has  mild pedal edema.   MEDICATIONS:  1. Simvastatin 20 mg nightly.  2. Diltiazem ER 240 mg b.i.d.  3. Metoprolol ER 50 mg half tablet daily.  4. Warfarin as directed.   PHYSICAL EXAMINATION:  GENERAL:  He is a well-nourished, well-developed  male, in no acute distress.  VITAL SIGNS:  Blood pressure is 120/76, pulse 92, weight 277 pounds.  HEENT:  Normal.  NECK:  Without JVD.  CARDIAC:  Normal S1 and S2.  Irregularly irregular rhythm.  LUNGS:  Clear to auscultation bilaterally.  No wheezing.  No rales.  ABDOMEN:  Soft, nontender.  EXTREMITIES:  With trace 1+ edema bilaterally.  NEUROLOGIC:  He is alert and oriented x3.  Cranial nerves II through XII  grossly intact.  Electrocardiogram reveals atrial fibrillation, heart rate 92, normal  axis, no acute changes.  Occasional PVC.   ASSESSMENT/PLAN:  1. Persistent atrial fibrillation.  His rate is fairly well-controlled      at this time.  I suspect when he exerts himself, his rate does go      up into the 100s.  He seems to be symptomatic since he has been      back in atrial fibrillation.  He seems to note more fatigue and      shortness of breath when he is not in sinus rhythm.  I am going to      up titrate his metoprolol to 50 mg a day.  I think this will      achieve better rate control.  He tells me that his INR was recently      between 2 and 3 at Dr. Shelby Mattocks office.  We will request those      records and request that he have his INR checked once a week until      further notice.  We are going to try to obtain his records from Dr.      Ardell Isaacs in [place].  If we cannot obtain those, we will definitely      set him up for an echocardiogram.  We may need to consider setting      him up for a Myoview  study to assess for ischemic heart disease.      He may require an antiarrhythmic to keep him in rhythm.  This      information would be helpful if we decide to go that route.  I will      bring him back in close follow up with Dr. Dietrich Pates in the next 3      weeks.  At that time, we can decide whether or not to proceed with      cardioversion and whether or not he will require any other therapy      to maintain sinus rhythm.  2. Dyslipidemia.  The patient is on simvastatin now.  He will continue      followup with his primary care physician in [facility].   DISPOSITION:  Followup with Dr. Dietrich Pates in 3 weeks or sooner p.r.n..  I  have given the patient permission to go back to work.      Tereso Newcomer, PA-C  Electronically Signed      Jesse Sans. Daleen Squibb, MD, [facility]  Electronically Signed   SW/MedQ  DD: 11/19/2008  DT: 11/20/2008  Job #: [id number]   cc:   Casilda Carls

## 2011-01-18 NOTE — Consult Note (Signed)
NAMEWILFRIDO, [name]               ACCOUNT NO.:  [id number]   MEDICAL RECORD NO.:  [id number]          PATIENT TYPE:  INP   LOCATION:  A318                          FACILITY:  [facility]   PHYSICIAN:  Brandon Friends. Dietrich Pates, MD, FACCDATE OF BIRTH:  21-Mar-1956   DATE OF CONSULTATION:  11/03/2008  DATE OF DISCHARGE:                                 CONSULTATION   PRIMARY CARE PHYSICIAN:  Dr. Reynolds [name] at [facility].  The patient is also seen by [name], nurse  practitioner.   CARDIOLOGIST:  He was previously followed by Dr. Graciela [name] in [place].  He was recently to be evaluated by [name] in  [place], but he has never been seen in our clinic.   REASON FOR REFERRAL:  Chest pain.   HISTORY OF PRESENT ILLNESS:  [name] is a 55 year old male with a  history of what sounds like paroxysmal atrial fibrillation who  previously followed with Dr. Graciela [name] in [place].  His cardiac history  includes prior cardiac catheterization in 2007 in [place].  The patient  notes that this test was okay.  He was subsequently set up for an  exercise Cardiolite in July of 2009.  The patient also notes that this  was okay.  He has been on Coumadin since 2007.  He apparently underwent  cardioversion in January of 2008.  He describes episodes of normal sinus  rhythm, as well as atrial fibrillation.  He is usually somewhat  symptomatic with his atrial fibrillation.  He will often describe a  fluttering sensation in his chest, as well as weakness and shortness of  breath.  He lives locally and had decided to switch to one of our  cardiologists.  He was previously scheduled with [name] but never  kept his appointment.  He has not had his INR checked since the summer  of 2009.  He recently developed dyspnea on exertion over the last 2  months.  He notes that this is similar to his symptoms of atrial  fibrillation.  He described NYHA class IIB to III symptoms.   He noted a  fluttering sensation in his chest on the date of admission.  This is  usually what he feels when he goes into atrial fibrillation.  He  subsequently developed some left chest heaviness, as well as shortness  of breath.  He presented to his primary care physician's office and was  referred to the emergency room at [name].  He denies any  associated radiating symptoms, nausea, diaphoresis.  His cardiac markers  have been negative x3.  His chest x-ray demonstrates atrial fibrillation  with a heart rate of 124, no ischemic changes.  His chest x-ray is  nonacute.  We are now asked to further evaluate.   PAST MEDICAL HISTORY:  As outlined above.  In addition, the patient  notes a history of diet-controlled diabetes mellitus, as well as  dyslipidemia.  His lipids are not treated.  He is supposed to be on  Lipitor.  He also notes a history of Gilbert's syndrome and is  status  post left Achilles tendon repair in 2003.   MEDICATIONS AT HOME:  1. Coumadin 12 mg daily.  2. Digoxin 0.125 mg daily.  3. Metoprolol 25 mg b.i.d.  4. Lipitor - not taking.   ALLERGIES:  PENICILLIN.   SOCIAL HISTORY:  He lives in [place] with his wife.  He is a  Corporate treasurer.  He has one son who is 84.  He drinks alcohol on  occasion.  He has smoked cigars off and on for 8 years and quit about 3  months ago.   FAMILY HISTORY:  Significant for premature CAD.  His brother had a  myocardial infarction at age 66.  He has father is deceased at age 7,  and he had a history of emphysema.  His mother died at age 82 and had a  history of diabetes mellitus, as well as renal failure.   REVIEW OF SYSTEMS:  Please see HPI.  Denies fevers, chills, headache,  sore throat, dysuria, hematuria, bright red blood per rectum or melena,  dysphagia.  He does note a mole on his left thigh.  He denies orthopnea,  PND or pedal edema.  He does note some lightheadedness/near syncope but  denies any frank  syncope.  Denies any cough.  The rest of the review of  systems are negative.   PHYSICAL EXAMINATION:  GENERAL:  He is a well-nourished, well-developed  male in no distress.  VITAL SIGNS:  Blood pressure is 121/65-98/67, pulse 91, respirations 20,  temperature 98.1.  Oxygen saturation 95% on room air.  Weight 121.4 kg.  HEENT:  Normal.  NECK:  Without JVD.  LYMPH:  Without lymphadenopathy.  ENDOCRINE:  Without thyromegaly.  CARDIAC:  Normal S1 and S2.  Irregularly irregular rhythm.  No murmur.  LUNGS:  Clear to auscultation bilaterally.  No rales, no wheezing.  SKIN:  Without rash.  ABDOMEN:  Soft, nontender with normoactive bowel sounds.  No  organomegaly.  EXTREMITIES:  Without clubbing, cyanosis or edema.  MUSCULOSKELETAL:  Without obvious deformity.  NEUROLOGIC:  He is alert and oriented x3.  Cranial nerves II-XII grossly  intact.  VASCULAR:  Without carotid bruits bilaterally.   CHEST X-RAY:  No acute disease.   ELECTROCARDIOGRAM:  As outlined above.   LABORATORY DATA:  White count 8200, hemoglobin 15.8, MCV 89.6, platelet  count 173,000, potassium 4.2, creatinine 1.32, glucose 87, hemoglobin  A1c 5.9, BNP 86.  CK-MB and troponin negative x3.  Digoxin level less  than 0.2.  Total cholesterol 177, triglycerides 83, HDL 28, LDL 132.   ASSESSMENT:  1. Paroxysmal versus persistent atrial fibrillation on chronic      Coumadin therapy.  2. Chest pain.  3. Diet-controlled diabetes mellitus.  4. Hyperlipidemia - currently not on a statin.  5. History of normal cardiac catheterization in 2007, and reportedly      normal Cardiolite study in 2009.   RECOMMENDATIONS:  The patient was also interviewed and examined by Dr.  Dietrich [name].  He has a history of atrial fibrillation of uncertain  chronicity.  His symptoms probably do not correlate with the presence or  absence of atrial fibrillation.  Digoxin is not necessary and will be  discontinued.  Fatigue is his main complaint and will  substitute  Diltiazem for his beta-blocker.  Metoprolol will be discontinued.  He  reports compliance with Coumadin at 12 mg a day.  He presents to the  hospital with a subtherapeutic INR.  Fortunately, he probably does not  need Coumadin  long-term.  His thromboembolic risk factors include what  appears to be diet-controlled diabetes mellitus with fairly optimal  hemoglobin A1c.  He reports never having history of hypertension.  He  has never had congestive heart failure or stroke.  His warfarin dose  will be adjusted in case we need to proceed with cardioversion.  We will  obtain records from his prior cardiologist in [place], [place].  A TSH  will be obtained.  Once we know the results of his cardiac  catheterization, we can make further recommendations regarding his lipid  treatment.   Thank you very much for the consultation.  We will go ahead and follow  the patient throughout the remainder of this admission.   INDICATIONS:  Thank you      Tereso Newcomer, PA-C      Brandon Friends. Dietrich Pates, MD, [facility]  Electronically Signed    SW/MEDQ  D:  11/03/2008  T:  11/03/2008  Job:  [id number]   cc:   [name], M.D.  [facility]

## 2011-04-20 ENCOUNTER — Other Ambulatory Visit: Payer: Self-pay

## 2011-04-20 ENCOUNTER — Emergency Department (HOSPITAL_COMMUNITY)
Admission: EM | Admit: 2011-04-20 | Discharge: 2011-04-20 | Disposition: A | Discharge status: Home / Self Care | Payer: BC Managed Care – PPO | Attending: Emergency Medicine | Admitting: Emergency Medicine

## 2011-04-20 ENCOUNTER — Encounter (HOSPITAL_COMMUNITY): Payer: Self-pay

## 2011-04-20 ENCOUNTER — Emergency Department (HOSPITAL_COMMUNITY): Payer: BC Managed Care – PPO

## 2011-04-20 DIAGNOSIS — E119 Type 2 diabetes mellitus without complications: Secondary | ICD-10-CM | POA: Insufficient documentation

## 2011-04-20 DIAGNOSIS — I4891 Unspecified atrial fibrillation: Secondary | ICD-10-CM | POA: Insufficient documentation

## 2011-04-20 DIAGNOSIS — H8110 Benign paroxysmal vertigo, unspecified ear: Secondary | ICD-10-CM

## 2011-04-20 DIAGNOSIS — E785 Hyperlipidemia, unspecified: Secondary | ICD-10-CM | POA: Insufficient documentation

## 2011-04-20 HISTORY — DX: Unspecified atrial fibrillation: I48.91

## 2011-04-20 LAB — BASIC METABOLIC PANEL
BUN: 20 mg/dL (ref 6–23)
Creatinine, Ser: 0.9 mg/dL (ref 0.50–1.35)
GFR calc Af Amer: >60 mL/min (ref >60)
GFR calc non Af Amer: >60 mL/min (ref >60)
Potassium: 4.8 mEq/L (ref 3.5–5.1)

## 2011-04-20 LAB — DIFFERENTIAL
Basophils Relative: 0 % (ref 0–1)
Eosinophils Absolute: 0.2 10*3/uL (ref 0.0–0.7)
Monocytes Relative: 7 % (ref 3–12)
Neutrophils Relative %: 74 % (ref 43–77)

## 2011-04-20 LAB — URINALYSIS, ROUTINE W REFLEX MICROSCOPIC
Bilirubin Urine: NEGATIVE
Glucose, UA: NEGATIVE mg/dL
Hgb urine dipstick: NEGATIVE
Specific Gravity, Urine: 1.025 (ref 1.005–1.030)
pH: 6 (ref 5.0–8.0)

## 2011-04-20 LAB — CARDIAC PANEL(CRET KIN+CKTOT+MB+TROPI)
CK, MB: 14.2 ng/mL (ref 0.3–4.0)
Troponin I: <0.3 ng/mL (ref <0.30)
Troponin I: <0.3 ng/mL (ref <0.30)

## 2011-04-20 LAB — CBC
Hemoglobin: 16.8 g/dL (ref 13.0–17.0)
MCH: 30.7 pg (ref 26.0–34.0)
MCHC: 33.9 g/dL (ref 30.0–36.0)

## 2011-04-20 MED ORDER — MECLIZINE HCL 12.5 MG PO TABS
25.0000 mg | ORAL_TABLET | Freq: Once | ORAL | Status: AC
  Administered 2011-04-20: 25 mg via ORAL
  Filled 2011-04-20: qty 2

## 2011-04-20 MED ORDER — SODIUM CHLORIDE 0.9 % IV SOLN: INTRAVENOUS | Status: DC

## 2011-04-20 MED ORDER — MECLIZINE HCL 25 MG PO TABS: 25.0000 mg | ORAL_TABLET | Freq: Four times a day (QID) | ORAL | Status: AC

## 2011-04-20 NOTE — ED Notes (Signed)
CRITICAL VALUE ALERT  Critical value received:  CKMB 14.2  Date of notification:  04/20/11  Time of notification:  1512  Critical value read back:yes  Nurse who received alert:  Angelene Giovanni  MD notified (1st page):  Deretha Emory  Time of first page:  1312  MD notified (2nd page):  Time of second page:  Responding MD: Deretha Emory   Time MD responded:

## 2011-04-20 NOTE — ED Provider Notes (Signed)
History    Scribed for Brandon Jakes, MD, the patient was seen in room APA09/APA09. This chart was scribed by Katha Cabal. This patient's care was started at 11:06AM.   CSN: [id number] Arrival date & time: 04/20/2011 10:24 AM  Chief Complaint  Patient presents with  . Dizziness   HPI DIETRICK [name] is a 55 y.o. male who presents to the Emergency Department complaining of mild "room spinning"  dizziness (able to drive to ED) onset this am, with associated  Nausea, slight HA, and ankle swelling (stands on feet at work).  Denies abdominal pain, back pain, diarrhea, and hearing changes.  Pt sates previous hx of ear infection, vertigo (previous episode 4-5 months ago given Antivert by PCP with improvement), and A fib (treated with Coumadin  and metoprolol by PCP).  PCP  [facility]   HPI ELEMENTS:  Onset: this am Duration: persistent since onset Timing: constant, intermittent, Quality: "room spinning"  Severity: mild dizziness and HA Modifying factors: Aggravated with position of head.   Context:  as above  Associated symptoms: Nausea, slight HA, ankle swelling (stands on feet at work).  Denies abdominal pain, diarrhea, and hearing changes.    PAST MEDICAL HISTORY:  Past Medical History  Diagnosis Date  . Hyperlipidemia   . Gilbert's syndrome   . Atrial fibrillation     PAST SURGICAL HISTORY:  Past Surgical History  Procedure Date  . Achilles tendon repair     Left    MEDICATIONS:  Previous Medications   ACETAMINOPHEN (TYLENOL) 325 MG TABLET    Take 325 mg by mouth every 6 (six) hours as needed. pain    FUROSEMIDE (LASIX) 20 MG TABLET    Take 1 tablet (20 mg total) by mouth daily.   METOPROLOL (TOPROL-XL) 50 MG 24 HR TABLET    Take 1 tablet (50 mg total) by mouth 2 (two) times daily.   SIMVASTATIN (ZOCOR) 20 MG TABLET    Take 20 mg by mouth at bedtime.       ALLERGIES:  Allergies as of 04/20/2011 - Review Complete 04/20/2011  Allergen Reaction Noted  .  Penicillins  04/20/2011     FAMILY HISTORY:  Family History  Problem Relation Age of Onset  . Diabetes Mother   . Emphysema Father   . Heart attack Brother      SOCIAL HISTORY: History   Social History  . Marital Status: Married    Spouse Name: N/A    Number of Children: N/A  . Years of Education: N/A   Social History Main Topics  . Smoking status: Current Everyday Smoker  . Smokeless tobacco: None  . Alcohol Use: Yes     beer occasionally  . Drug Use: No  . Sexually Active: None   Other Topics Concern  . None   Social History Narrative  . None    Review of Systems 10 Systems reviewed and are negative for acute change except as noted in the HPI.  Physical Exam  BP 140/95  Pulse 95  Temp(Src) 97.8 F (36.6 C) (Oral)  Resp 20  Ht 6\' 1"  (1.854 m)  Wt 272 lb (123.378 kg)  BMI 35.89 kg/m2  SpO2 99%  Physical Exam  Constitutional: He is oriented to person, place, and time. He appears well-developed and well-nourished. No distress.  HENT:  Head: Normocephalic and atraumatic.  Right Ear: Tympanic membrane normal.  Left Ear: Tympanic membrane normal.       Movement of head left and right  increases room spinning/dizziness.    Eyes: Pupils are equal, round, and reactive to light.  Neck: Neck supple.  Cardiovascular: Normal rate.  Exam reveals no gallop and no friction rub.   No murmur heard.      A FIB   Pulmonary/Chest: Effort normal and breath sounds normal. No respiratory distress.  Abdominal: Soft. Bowel sounds are normal. He exhibits no distension. There is no tenderness.  Musculoskeletal: Normal range of motion. He exhibits edema (no pedal pitting edema ).  Neurological: He is alert and oriented to person, place, and time. No cranial nerve deficit.  Skin: Skin is warm and dry.  Psychiatric: He has a normal mood and affect. His behavior is normal.    ED Course  Procedures OTHER DATA REVIEWED: Nursing notes, vital signs, and past medical records  reviewed.   DIAGNOSTIC STUDIES:  11:08AM Cardiac Monitor   Heart rate 90, pt in A fib, interpreted by EDMD.  Oxygen Saturation is 97% on room air, normal, by my interpretation.    LABS / RADIOLOGY: Results for orders placed during the hospital encounter of 04/20/11  CBC      Component Value Range   WBC 9.1  4.0 - 10.5 (K/uL)   RBC 5.48  4.22 - 5.81 (MIL/uL)   Hemoglobin 16.8  13.0 - 17.0 (g/dL)   HCT 16.1  09.6 - 04.5 (%)   MCV 90.3  78.0 - 100.0 (fL)   MCH 30.7  26.0 - 34.0 (pg)   MCHC 33.9  30.0 - 36.0 (g/dL)   RDW 40.9  81.1 - 91.4 (%)   Platelets 177  150 - 400 (K/uL)  DIFFERENTIAL      Component Value Range   Neutrophils Relative 74  43 - 77 (%)   Neutro Abs 6.7  1.7 - 7.7 (K/uL)   Lymphocytes Relative 17  12 - 46 (%)   Lymphs Abs 1.5  0.7 - 4.0 (K/uL)   Monocytes Relative 7  3 - 12 (%)   Monocytes Absolute 0.7  0.1 - 1.0 (K/uL)   Eosinophils Relative 2  0 - 5 (%)   Eosinophils Absolute 0.2  0.0 - 0.7 (K/uL)   Basophils Relative 0  0 - 1 (%)   Basophils Absolute 0.0  0.0 - 0.1 (K/uL)  BASIC METABOLIC PANEL      Component Value Range   Sodium 140  135 - 145 (mEq/L)   Potassium 4.8  3.5 - 5.1 (mEq/L)   Chloride 105  96 - 112 (mEq/L)   CO2 24  19 - 32 (mEq/L)   Glucose, Bld 103 (*) 70 - 99 (mg/dL)   BUN 20  6 - 23 (mg/dL)   Creatinine, Ser 7.82  0.50 - 1.35 (mg/dL)   Calcium 95.6  8.4 - 10.5 (mg/dL)   GFR calc non Af Amer >60  >60 (mL/min)   GFR calc Af Amer >60  >60 (mL/min)  CARDIAC PANEL(CRET KIN+CKTOT+MB+TROPI)      Component Value Range   Total CK 509 (*) 7 - 232 (U/L)   CK, MB 18.3 (*) 0.3 - 4.0 (ng/mL)   Troponin I <0.30  <0.30 (ng/mL)   Relative Index 3.6 (*) 0.0 - 2.5   URINALYSIS, ROUTINE W REFLEX MICROSCOPIC      Component Value Range   Color, Urine YELLOW  YELLOW    Appearance CLEAR  CLEAR    Specific Gravity, Urine 1.025  1.005 - 1.030    pH 6.0  5.0 - 8.0    Glucose, UA  NEGATIVE  NEGATIVE (mg/dL)   Hgb urine dipstick NEGATIVE  NEGATIVE     Bilirubin Urine NEGATIVE  NEGATIVE    Ketones, ur NEGATIVE  NEGATIVE (mg/dL)   Protein, ur NEGATIVE  NEGATIVE (mg/dL)   Urobilinogen, UA 0.2  0.0 - 1.0 (mg/dL)   Nitrite NEGATIVE  NEGATIVE    Leukocytes, UA NEGATIVE  NEGATIVE   CARDIAC PANEL(CRET KIN+CKTOT+MB+TROPI)      Component Value Range   Total CK 434 (*) 7 - 232 (U/L)   CK, MB 14.2 (*) 0.3 - 4.0 (ng/mL)   Troponin I <0.30  <0.30 (ng/mL)   Relative Index 3.3 (*) 0.0 - 2.5     Dg Chest 2 View  04/20/2011  *RADIOLOGY REPORT*  Clinical Data: Dizziness, nausea, atrial fibrillation, diabetes  CHEST - 2 VIEW  Comparison: 10/31/2008  Findings: Borderline enlargement of cardiac silhouette. Mediastinal contours and pulmonary vascularity normal. Lungs clear. No pleural effusion or pneumothorax. Bones unremarkable.  IMPRESSION: Borderline enlargement of cardiac silhouette.  Original Report Authenticated By: Lollie Marrow, M.D.    ED COURSE / COORDINATION OF CARE: 11:30 AM  Physician informed of critical lab value- CKMB.   11:46AM- Recheck- Patient denies chest pain and additional cardiac symptoms. 3:24 PM Consult with Hospitalist completed.  Pt case explained and discussed. Explained CKMB, total CK and index elevated but Troponin negative.  Kidney function WNL.  Hospitalist recommends discharged home.   3:40 PM  Patient reports dizziness improved.  Explained lab and imaging results and recommendation from Hospitalist.  Informed of discharge home and patient agrees.      Date: 04/20/2011  Rate: 84  Rhythm: ATRIAL FIBRILLLATION  QRS Axis: normal  Intervals: normal  ST/T Wave abnormalities: normal  Conduction Disutrbances:none  Narrative Interpretation:   Old EKG Reviewed: none available   Orders Placed This Encounter  Procedures  . DG Chest 2 View  . CBC  . Differential  . Basic metabolic panel  . Cardiac panel(cret kin+cktot+mb+tropi)  . Urinalysis with microscopic  . Cardiac panel Timed (cret kin+cktot+mb+tropi)  . Cardiac  monitoring  . Cardiac monitoring  . Consult to hospitalist  . ED EKG  . Saline lock IV   Results for orders placed during the hospital encounter of 04/20/11  CBC      Component Value Range   WBC 9.1  4.0 - 10.5 (K/uL)   RBC 5.48  4.22 - 5.81 (MIL/uL)   Hemoglobin 16.8  13.0 - 17.0 (g/dL)   HCT 16.1  09.6 - 04.5 (%)   MCV 90.3  78.0 - 100.0 (fL)   MCH 30.7  26.0 - 34.0 (pg)   MCHC 33.9  30.0 - 36.0 (g/dL)   RDW 40.9  81.1 - 91.4 (%)   Platelets 177  150 - 400 (K/uL)  DIFFERENTIAL      Component Value Range   Neutrophils Relative 74  43 - 77 (%)   Neutro Abs 6.7  1.7 - 7.7 (K/uL)   Lymphocytes Relative 17  12 - 46 (%)   Lymphs Abs 1.5  0.7 - 4.0 (K/uL)   Monocytes Relative 7  3 - 12 (%)   Monocytes Absolute 0.7  0.1 - 1.0 (K/uL)   Eosinophils Relative 2  0 - 5 (%)   Eosinophils Absolute 0.2  0.0 - 0.7 (K/uL)   Basophils Relative 0  0 - 1 (%)   Basophils Absolute 0.0  0.0 - 0.1 (K/uL)  BASIC METABOLIC PANEL      Component Value Range   Sodium  140  135 - 145 (mEq/L)   Potassium 4.8  3.5 - 5.1 (mEq/L)   Chloride 105  96 - 112 (mEq/L)   CO2 24  19 - 32 (mEq/L)   Glucose, Bld 103 (*) 70 - 99 (mg/dL)   BUN 20  6 - 23 (mg/dL)   Creatinine, Ser 1.61  0.50 - 1.35 (mg/dL)   Calcium 09.6  8.4 - 10.5 (mg/dL)   GFR calc non Af Amer >60  >60 (mL/min)   GFR calc Af Amer >60  >60 (mL/min)  CARDIAC PANEL(CRET KIN+CKTOT+MB+TROPI)      Component Value Range   Total CK 509 (*) 7 - 232 (U/L)   CK, MB 18.3 (*) 0.3 - 4.0 (ng/mL)   Troponin I <0.30  <0.30 (ng/mL)   Relative Index 3.6 (*) 0.0 - 2.5   URINALYSIS, ROUTINE W REFLEX MICROSCOPIC      Component Value Range   Color, Urine YELLOW  YELLOW    Appearance CLEAR  CLEAR    Specific Gravity, Urine 1.025  1.005 - 1.030    pH 6.0  5.0 - 8.0    Glucose, UA NEGATIVE  NEGATIVE (mg/dL)   Hgb urine dipstick NEGATIVE  NEGATIVE    Bilirubin Urine NEGATIVE  NEGATIVE    Ketones, ur NEGATIVE  NEGATIVE (mg/dL)   Protein, ur NEGATIVE  NEGATIVE  (mg/dL)   Urobilinogen, UA 0.2  0.0 - 1.0 (mg/dL)   Nitrite NEGATIVE  NEGATIVE    Leukocytes, UA NEGATIVE  NEGATIVE   CARDIAC PANEL(CRET KIN+CKTOT+MB+TROPI)      Component Value Range   Total CK 434 (*) 7 - 232 (U/L)   CK, MB 14.2 (*) 0.3 - 4.0 (ng/mL)   Troponin I <0.30  <0.30 (ng/mL)   Relative Index 3.3 (*) 0.0 - 2.5    Results for orders placed during the hospital encounter of 04/20/11  CBC      Component Value Range   WBC 9.1  4.0 - 10.5 (K/uL)   RBC 5.48  4.22 - 5.81 (MIL/uL)   Hemoglobin 16.8  13.0 - 17.0 (g/dL)   HCT 04.5  40.9 - 81.1 (%)   MCV 90.3  78.0 - 100.0 (fL)   MCH 30.7  26.0 - 34.0 (pg)   MCHC 33.9  30.0 - 36.0 (g/dL)   RDW 91.4  78.2 - 95.6 (%)   Platelets 177  150 - 400 (K/uL)  DIFFERENTIAL      Component Value Range   Neutrophils Relative 74  43 - 77 (%)   Neutro Abs 6.7  1.7 - 7.7 (K/uL)   Lymphocytes Relative 17  12 - 46 (%)   Lymphs Abs 1.5  0.7 - 4.0 (K/uL)   Monocytes Relative 7  3 - 12 (%)   Monocytes Absolute 0.7  0.1 - 1.0 (K/uL)   Eosinophils Relative 2  0 - 5 (%)   Eosinophils Absolute 0.2  0.0 - 0.7 (K/uL)   Basophils Relative 0  0 - 1 (%)   Basophils Absolute 0.0  0.0 - 0.1 (K/uL)  BASIC METABOLIC PANEL      Component Value Range   Sodium 140  135 - 145 (mEq/L)   Potassium 4.8  3.5 - 5.1 (mEq/L)   Chloride 105  96 - 112 (mEq/L)   CO2 24  19 - 32 (mEq/L)   Glucose, Bld 103 (*) 70 - 99 (mg/dL)   BUN 20  6 - 23 (mg/dL)   Creatinine, Ser 2.13  0.50 - 1.35 (mg/dL)  Calcium 10.2  8.4 - 10.5 (mg/dL)   GFR calc non Af Amer >60  >60 (mL/min)   GFR calc Af Amer >60  >60 (mL/min)  CARDIAC PANEL(CRET KIN+CKTOT+MB+TROPI)      Component Value Range   Total CK 509 (*) 7 - 232 (U/L)   CK, MB 18.3 (*) 0.3 - 4.0 (ng/mL)   Troponin I <0.30  <0.30 (ng/mL)   Relative Index 3.6 (*) 0.0 - 2.5   URINALYSIS, ROUTINE W REFLEX MICROSCOPIC      Component Value Range   Color, Urine YELLOW  YELLOW    Appearance CLEAR  CLEAR    Specific Gravity, Urine 1.025   1.005 - 1.030    pH 6.0  5.0 - 8.0    Glucose, UA NEGATIVE  NEGATIVE (mg/dL)   Hgb urine dipstick NEGATIVE  NEGATIVE    Bilirubin Urine NEGATIVE  NEGATIVE    Ketones, ur NEGATIVE  NEGATIVE (mg/dL)   Protein, ur NEGATIVE  NEGATIVE (mg/dL)   Urobilinogen, UA 0.2  0.0 - 1.0 (mg/dL)   Nitrite NEGATIVE  NEGATIVE    Leukocytes, UA NEGATIVE  NEGATIVE   CARDIAC PANEL(CRET KIN+CKTOT+MB+TROPI)      Component Value Range   Total CK 434 (*) 7 - 232 (U/L)   CK, MB 14.2 (*) 0.3 - 4.0 (ng/mL)   Troponin I <0.30  <0.30 (ng/mL)   Relative Index 3.3 (*) 0.0 - 2.5    Dg Chest 2 View  04/20/2011  *RADIOLOGY REPORT*  Clinical Data: Dizziness, nausea, atrial fibrillation, diabetes  CHEST - 2 VIEW  Comparison: 10/31/2008  Findings: Borderline enlargement of cardiac silhouette. Mediastinal contours and pulmonary vascularity normal. Lungs clear. No pleural effusion or pneumothorax. Bones unremarkable.  IMPRESSION: Borderline enlargement of cardiac silhouette.  Original Report Authenticated By: Lollie Marrow, M.D.      MDM:  CLINICALLY PRESENTATION CW VERTIGO. AND IMPROVED WITH ANTIVERT. BUT INITIAL ELEVATED CK ADN MB WITH INDEX REQUIRED FURTHER CARDIAC MARKER. REST OF WORK UP NEGAITVE NO BLOOD IN UREIN RENAL FX NORAML AND CXR NORMAL EKG WITH KNOWN ATRIAL FIB RATE CONTROLLED. 2 SETS OF TN NEGATIVE NOT ELEVATED AT ALL. NO CP NO CHEST DISCOMFORT. REPEAT CK MB STILL ELEVATED BUT IMPROVED. DW HOSPITALIST WHO AGREE NOT CLINICALLY SIG IN FACE OF 2 NEGATIVE TN'S AND NO SYMPTOMS.   IMPRESSION: Diagnoses that have been ruled out:  Diagnoses that are still under consideration:  Final diagnoses:    PLAN: Discharged home.   Home Nonsteroidals and Advised to return for worsening or additional problems such as abdominal or chest pain The patient is to return the emergency department if there is any worsening of symptoms. I have reviewed the discharge instructions with the patient.    CONDITION ON  DISCHARGE: Good   MEDICATIONS GIVEN IN THE E.D.  Medications  acetaminophen (TYLENOL) 325 MG tablet (not administered)  warfarin (COUMADIN) 10 MG tablet (not administered)  0.9 %  sodium chloride infusion (not administered)  meclizine (ANTIVERT) tablet 25 mg (25 mg Oral Given 04/20/11 1140)     DISCHARGE MEDICATIONS: New Prescriptions   No medications on file        I personally performed the services described in this documentation, which was scribed in my presence. The recorded information has been reviewed and considered. Brandon Jakes, MD    Brandon Jakes, MD 04/20/11 1600

## 2011-04-20 NOTE — ED Notes (Signed)
Error in documentation, pt's standing pulse was 96 per dinamap.

## 2011-04-20 NOTE — ED Notes (Signed)
Pt c/o dizziness and nausea that occurred this morning. Pt was resting at home with symptoms started. Pt has hx of A fib. Pt states " my heart goes in and out of rhythm" pt currently Afib on monitor. Rated controlled in 90's. Denies cp or sob. Pt also states he has problems with his inner ear and unsure if his inner ear is acting up per pt. nad noted at this time. A/o x3. Grips intact and strong. Lungs cta.

## 2011-04-20 NOTE — ED Notes (Signed)
Pt reports got up around 0730 this morning with dizziness and nausea.  Reports history of same when had an inner ear problem.

## 2011-04-20 NOTE — ED Notes (Signed)
CRITICAL VALUE ALERT  Critical value received:  CKMB 18.3  Date of notification:  04/20/2011  Time of notification:  1125  Critical value read back:yes  Nurse who received alert:  Angelene Giovanni  MD notified (1st page):  Deretha Emory  Time of first page:   MD notified (2nd page):  Time of second page:  Responding MD:  Time MD responded:

## 2011-04-20 NOTE — ED Notes (Signed)
Pt states dizziness is better but still present. Pt stood up and had to hold on to table. Pt requesting food. States he has not eaten all day. EDP Zackowski aware. Pt aware awaiting on last labs drawn.

## 2011-04-20 NOTE — ED Notes (Signed)
Pt returned from xray. Monitor hooked back up. States dizziness a little better than this morning prior to meclizine given. nad at this time. EDP came in room around 1145 to update pt.

## 2011-05-06 NOTE — Progress Notes (Signed)
NO MESSAGE/TMJ °

## 2011-05-26 LAB — DIFFERENTIAL
Basophils Absolute: 0
Eosinophils Absolute: 0.2
Eosinophils Relative: 3
Lymphs Abs: 1.4
Monocytes Absolute: 0.5
Monocytes Relative: 7
Neutro Abs: 5
Neutrophils Relative %: 70

## 2011-05-26 LAB — CBC
RBC: 4.74
WBC: 7.2

## 2011-06-07 LAB — PROTIME-INR
INR: 1.2
Prothrombin Time: 16 — ABNORMAL HIGH

## 2011-06-07 LAB — GLUCOSE, CAPILLARY: Glucose-Capillary: 77

## 2011-06-20 ENCOUNTER — Encounter (HOSPITAL_COMMUNITY): Payer: Self-pay | Admitting: Emergency Medicine

## 2011-06-20 ENCOUNTER — Emergency Department (HOSPITAL_COMMUNITY)
Admission: EM | Admit: 2011-06-20 | Discharge: 2011-06-20 | Disposition: A | Discharge status: Home / Self Care | Payer: BC Managed Care – PPO | Attending: Emergency Medicine | Admitting: Emergency Medicine

## 2011-06-20 DIAGNOSIS — I4891 Unspecified atrial fibrillation: Secondary | ICD-10-CM | POA: Insufficient documentation

## 2011-06-20 DIAGNOSIS — R000 Tachycardia, unspecified: Secondary | ICD-10-CM | POA: Insufficient documentation

## 2011-06-20 DIAGNOSIS — X500XXA Overexertion from strenuous movement or load, initial encounter: Secondary | ICD-10-CM | POA: Insufficient documentation

## 2011-06-20 DIAGNOSIS — S335XXA Sprain of ligaments of lumbar spine, initial encounter: Secondary | ICD-10-CM | POA: Insufficient documentation

## 2011-06-20 DIAGNOSIS — F17200 Nicotine dependence, unspecified, uncomplicated: Secondary | ICD-10-CM | POA: Insufficient documentation

## 2011-06-20 DIAGNOSIS — S39012A Strain of muscle, fascia and tendon of lower back, initial encounter: Secondary | ICD-10-CM

## 2011-06-20 DIAGNOSIS — E119 Type 2 diabetes mellitus without complications: Secondary | ICD-10-CM | POA: Insufficient documentation

## 2011-06-20 DIAGNOSIS — E785 Hyperlipidemia, unspecified: Secondary | ICD-10-CM | POA: Insufficient documentation

## 2011-06-20 DIAGNOSIS — R42 Dizziness and giddiness: Secondary | ICD-10-CM | POA: Insufficient documentation

## 2011-06-20 DIAGNOSIS — Z7901 Long term (current) use of anticoagulants: Secondary | ICD-10-CM | POA: Insufficient documentation

## 2011-06-20 HISTORY — DX: Dizziness and giddiness: R42

## 2011-06-20 MED ORDER — HYDROCODONE-ACETAMINOPHEN 5-325 MG PO TABS: ORAL_TABLET | ORAL | Status: DC

## 2011-06-20 MED ORDER — METHOCARBAMOL 500 MG PO TABS: ORAL_TABLET | ORAL | Status: DC

## 2011-06-20 NOTE — ED Notes (Signed)
Patient c/o lower back pain x 2 days. Patient reports moving refrigerator this morning making pain worse. Per patient used to have chronic back pain.

## 2011-06-20 NOTE — ED Provider Notes (Signed)
History     CSN: [id number] Arrival date & time: 06/20/2011  4:17 PM  Chief Complaint  Patient presents with  . Back Pain    (Consider location/radiation/quality/duration/timing/severity/associated sxs/prior treatment) Patient is a 55 y.o. male presenting with back pain. The history is provided by the patient.  Back Pain  This is a new problem. The current episode started 2 days ago. The problem occurs daily. The problem has been gradually worsening. The pain is associated with lifting heavy objects. The pain is present in the lumbar spine. The quality of the pain is described as shooting and aching. Radiates to: both hips. The pain is at a severity of 6/10. The pain is moderate. The symptoms are aggravated by bending and certain positions. Stiffness is present all day. Pertinent negatives include no chest pain, no abdominal pain, no bowel incontinence, no perianal numbness, no bladder incontinence, no dysuria and no paresthesias. He has tried analgesics for the symptoms.    Past Medical History  Diagnosis Date  . Hyperlipidemia   . Gilbert's syndrome   . Atrial fibrillation   . Vertigo   . Diabetes mellitus     Past Surgical History  Procedure Date  . Achilles tendon repair     Left    Family History  Problem Relation Age of Onset  . Diabetes Mother   . Emphysema Father   . Heart failure Father   . Heart attack Brother   . Aneurysm Sister     History  Substance Use Topics  . Smoking status: Current Everyday Smoker -- 0.0 packs/day for 10 years    Types: Cigars  . Smokeless tobacco: Never Used  . Alcohol Use: 1.8 oz/week    3 Cans of beer per week     beer occasionally      Review of Systems  Constitutional: Negative for activity change.       All ROS Neg except as noted in HPI  HENT: Negative for nosebleeds and neck pain.   Eyes: Negative for photophobia and discharge.  Respiratory: Negative for cough, shortness of breath and wheezing.   Cardiovascular:  Positive for palpitations. Negative for chest pain.  Gastrointestinal: Negative for abdominal pain, blood in stool and bowel incontinence.  Genitourinary: Negative for bladder incontinence, dysuria, frequency and hematuria.  Musculoskeletal: Positive for back pain. Negative for arthralgias.  Skin: Negative.   Neurological: Positive for dizziness. Negative for seizures, speech difficulty and paresthesias.  Psychiatric/Behavioral: Negative for hallucinations and confusion.    Allergies  Penicillins  Home Medications   Current Outpatient Rx  Name Route Sig Dispense Refill  . ACETAMINOPHEN 325 MG PO TABS Oral Take 325 mg by mouth every 6 (six) hours as needed. pain     . FUROSEMIDE 20 MG PO TABS Oral Take 1 tablet (20 mg total) by mouth daily. 30 tablet 2  . METOPROLOL SUCCINATE 50 MG PO TB24 Oral Take 1 tablet (50 mg total) by mouth 2 (two) times daily. 90 tablet 4  . SIMVASTATIN 20 MG PO TABS Oral Take 20 mg by mouth at bedtime.      . WARFARIN SODIUM 10 MG PO TABS Oral Take 15 mg by mouth daily. As directed by Anticoagulation clinic.       BP 138/51  Pulse 74  Temp(Src) 98.2 F (36.8 C) (Oral)  Resp 20  Ht 6\' 1"  (1.854 m)  Wt 270 lb (122.471 kg)  BMI 35.62 kg/m2  SpO2 100%  Physical Exam  Nursing note and vitals reviewed. Constitutional:  He is oriented to person, place, and time. He appears well-developed and well-nourished.  Non-toxic appearance.  HENT:  Head: Normocephalic.  Right Ear: Tympanic membrane and external ear normal.  Left Ear: Tympanic membrane and external ear normal.  Eyes: EOM and lids are normal. Pupils are equal, round, and reactive to light.  Neck: Normal range of motion. Neck supple. Carotid bruit is not present.  Cardiovascular: Normal rate, regular rhythm, normal heart sounds, intact distal pulses and normal pulses.   Pulmonary/Chest: Breath sounds normal. No respiratory distress.  Abdominal: Soft. Bowel sounds are normal. There is no tenderness.  There is no guarding.  Musculoskeletal: Normal range of motion. He exhibits tenderness.       Lower back pain with ROM and palpation.  Lymphadenopathy:       Head (right side): No submandibular adenopathy present.       Head (left side): No submandibular adenopathy present.    He has no cervical adenopathy.  Neurological: He is alert and oriented to person, place, and time. He has normal strength. No cranial nerve deficit or sensory deficit. He exhibits normal muscle tone. Coordination normal.       No sensory or motor deficits.  Skin: Skin is warm and dry.  Psychiatric: He has a normal mood and affect. His speech is normal.    ED Course  Procedures (including critical care time)  Labs Reviewed - No data to display No results found.   Dx: Lumbar strain   MDM  I have reviewed nursing notes, vital signs, and all appropriate lab and imaging results for this patient.        Kathie Dike, [place] 06/20/11 [phone]

## 2011-06-24 ENCOUNTER — Encounter (HOSPITAL_COMMUNITY): Payer: Self-pay | Admitting: Emergency Medicine

## 2011-06-24 ENCOUNTER — Emergency Department (HOSPITAL_COMMUNITY)
Admission: EM | Admit: 2011-06-24 | Discharge: 2011-06-24 | Discharge status: Against Medical Advice | Payer: BC Managed Care – PPO | Attending: Emergency Medicine | Admitting: Emergency Medicine

## 2011-06-24 DIAGNOSIS — M545 Low back pain, unspecified: Secondary | ICD-10-CM | POA: Insufficient documentation

## 2011-06-24 DIAGNOSIS — Z5320 Procedure and treatment not carried out because of patient's decision for unspecified reasons: Secondary | ICD-10-CM | POA: Insufficient documentation

## 2011-06-24 NOTE — ED Notes (Signed)
Pt here for lower back pain. Pt states he was seen in ed 10/15 but  he lost his prescription for hydrocodone.

## 2011-06-24 NOTE — ED Notes (Signed)
Called x one no reply

## 2011-06-26 NOTE — ED Provider Notes (Signed)
Medical screening examination/treatment/procedure(s) were performed by non-physician practitioner and as supervising physician I was immediately available for consultation/collaboration.   Shelda Jakes, MD 06/26/11 [phone]

## 2011-07-13 ENCOUNTER — Telehealth: Payer: Self-pay | Admitting: Cardiology

## 2011-07-13 NOTE — Telephone Encounter (Signed)
CALLED PATIENT AND MADE APPT FOR COUMADIN TO BE CHECKED.  PATIENT ADMITS TO NOT BEING ON THE PROGRAM, AND NEEDS TO GET BACK IN.  SCHEDULED APPT.

## 2011-07-18 ENCOUNTER — Encounter: Payer: BC Managed Care – PPO | Admitting: *Deleted

## 2011-07-19 ENCOUNTER — Encounter: Payer: Self-pay | Admitting: *Deleted

## 2011-07-19 NOTE — Patient Instructions (Signed)
Pt no showed for for appt on 07/18/11

## 2011-07-20 ENCOUNTER — Telehealth: Payer: Self-pay | Admitting: *Deleted

## 2011-07-20 NOTE — Telephone Encounter (Signed)
NO V/M TO LEAVE MESSAGE BUT I MAILED OUT LETTER ON 07/18/11/TMJ

## 2011-08-17 ENCOUNTER — Inpatient Hospital Stay (HOSPITAL_COMMUNITY)
Admission: EM | Admit: 2011-08-17 | Discharge: 2011-08-22 | DRG: 014 | Disposition: A | Discharge status: Home / Self Care | Payer: BC Managed Care – PPO | Attending: Internal Medicine | Admitting: Internal Medicine

## 2011-08-17 ENCOUNTER — Emergency Department (HOSPITAL_COMMUNITY): Payer: BC Managed Care – PPO

## 2011-08-17 ENCOUNTER — Encounter (HOSPITAL_COMMUNITY): Payer: Self-pay | Admitting: *Deleted

## 2011-08-17 ENCOUNTER — Other Ambulatory Visit: Payer: Self-pay

## 2011-08-17 DIAGNOSIS — I6529 Occlusion and stenosis of unspecified carotid artery: Secondary | ICD-10-CM | POA: Diagnosis present

## 2011-08-17 DIAGNOSIS — Z7901 Long term (current) use of anticoagulants: Secondary | ICD-10-CM

## 2011-08-17 DIAGNOSIS — I6522 Occlusion and stenosis of left carotid artery: Secondary | ICD-10-CM | POA: Diagnosis present

## 2011-08-17 DIAGNOSIS — I10 Essential (primary) hypertension: Secondary | ICD-10-CM | POA: Diagnosis present

## 2011-08-17 DIAGNOSIS — I4891 Unspecified atrial fibrillation: Secondary | ICD-10-CM | POA: Diagnosis present

## 2011-08-17 DIAGNOSIS — H5440 Blindness, one eye, unspecified eye: Secondary | ICD-10-CM | POA: Diagnosis present

## 2011-08-17 DIAGNOSIS — I6350 Cerebral infarction due to unspecified occlusion or stenosis of unspecified cerebral artery: Principal | ICD-10-CM | POA: Diagnosis present

## 2011-08-17 DIAGNOSIS — H547 Unspecified visual loss: Secondary | ICD-10-CM

## 2011-08-17 DIAGNOSIS — I639 Cerebral infarction, unspecified: Secondary | ICD-10-CM | POA: Diagnosis present

## 2011-08-17 DIAGNOSIS — E785 Hyperlipidemia, unspecified: Secondary | ICD-10-CM | POA: Diagnosis present

## 2011-08-17 DIAGNOSIS — E119 Type 2 diabetes mellitus without complications: Secondary | ICD-10-CM | POA: Diagnosis present

## 2011-08-17 HISTORY — DX: Disease of blood and blood-forming organs, unspecified: D75.9

## 2011-08-17 HISTORY — DX: Cerebral infarction, unspecified: I63.9

## 2011-08-17 HISTORY — DX: Essential (primary) hypertension: I10

## 2011-08-17 HISTORY — DX: Angina pectoris, unspecified: I20.9

## 2011-08-17 HISTORY — DX: Unspecified atrial flutter: I48.92

## 2011-08-17 LAB — PROTIME-INR: Prothrombin Time: 13 seconds (ref 11.6–15.2)

## 2011-08-17 LAB — CBC
HCT: 43.4 % (ref 39.0–52.0)
RBC: 4.82 MIL/uL (ref 4.22–5.81)
RDW: 13.3 % (ref 11.5–15.5)
WBC: 7.6 10*3/uL (ref 4.0–10.5)

## 2011-08-17 LAB — DIFFERENTIAL
Basophils Absolute: 0 10*3/uL (ref 0.0–0.1)
Lymphocytes Relative: 23 % (ref 12–46)
Monocytes Absolute: 0.8 10*3/uL (ref 0.1–1.0)
Neutro Abs: 4.8 10*3/uL (ref 1.7–7.7)

## 2011-08-17 LAB — BASIC METABOLIC PANEL
Chloride: 106 mEq/L (ref 96–112)
Creatinine, Ser: 1.13 mg/dL (ref 0.50–1.35)
GFR calc Af Amer: 83 mL/min — ABNORMAL LOW (ref >90)
Potassium: 4 mEq/L (ref 3.5–5.1)
Sodium: 137 mEq/L (ref 135–145)

## 2011-08-17 LAB — GLUCOSE, CAPILLARY
Glucose-Capillary: 88 mg/dL (ref 70–99)
Glucose-Capillary: 83 mg/dL (ref 70–99)

## 2011-08-17 LAB — SEDIMENTATION RATE: Sed Rate: 18 mm/hr — ABNORMAL HIGH (ref 0–16)

## 2011-08-17 LAB — HEPARIN LEVEL (UNFRACTIONATED): Heparin Unfractionated: 0.28 IU/mL — ABNORMAL LOW (ref 0.30–0.70)

## 2011-08-17 MED ORDER — HEPARIN SOD (PORCINE) IN D5W 100 UNIT/ML IV SOLN
18.0000 [IU]/kg/h | INTRAVENOUS | Status: DC
  Administered 2011-08-17: 18 [IU]/kg/h via INTRAVENOUS
  Filled 2011-08-17: qty 250

## 2011-08-17 MED ORDER — HEPARIN SOD (PORCINE) IN D5W 100 UNIT/ML IV SOLN
1800.0000 [IU]/h | INTRAVENOUS | Status: DC
  Administered 2011-08-18: 1850 [IU]/h via INTRAVENOUS
  Filled 2011-08-17 (×3): qty 250

## 2011-08-17 MED ORDER — HEPARIN BOLUS VIA INFUSION
3000.0000 [IU] | Freq: Once | INTRAVENOUS | Status: AC
  Administered 2011-08-17: 3000 [IU] via INTRAVENOUS

## 2011-08-17 MED ORDER — SODIUM CHLORIDE 0.9 % IJ SOLN
3.0000 mL | INTRAMUSCULAR | Status: DC | PRN
  Administered 2011-08-18: 3 mL via INTRAVENOUS

## 2011-08-17 MED ORDER — ISOSORBIDE MONONITRATE ER 30 MG PO TB24
30.0000 mg | ORAL_TABLET | Freq: Every day | ORAL | Status: DC
  Administered 2011-08-17 – 2011-08-22 (×6): 30 mg via ORAL
  Filled 2011-08-17 (×6): qty 1

## 2011-08-17 MED ORDER — METOPROLOL SUCCINATE ER 50 MG PO TB24
50.0000 mg | ORAL_TABLET | Freq: Two times a day (BID) | ORAL | Status: DC
  Administered 2011-08-17 – 2011-08-22 (×10): 50 mg via ORAL
  Filled 2011-08-17 (×11): qty 1

## 2011-08-17 MED ORDER — ACETAZOLAMIDE SODIUM 500 MG IJ SOLR
500.0000 mg | Freq: Once | INTRAMUSCULAR | Status: DC
  Filled 2011-08-17 (×2): qty 500

## 2011-08-17 MED ORDER — INSULIN ASPART 100 UNIT/ML ~~LOC~~ SOLN: 0.0000 [IU] | Freq: Three times a day (TID) | SUBCUTANEOUS | Status: DC

## 2011-08-17 MED ORDER — ASPIRIN EC 325 MG PO TBEC
325.0000 mg | DELAYED_RELEASE_TABLET | Freq: Every day | ORAL | Status: DC
  Administered 2011-08-18 – 2011-08-22 (×5): 325 mg via ORAL
  Filled 2011-08-17 (×3): qty 1

## 2011-08-17 MED ORDER — WARFARIN SODIUM 7.5 MG PO TABS
15.0000 mg | ORAL_TABLET | Freq: Once | ORAL | Status: AC
  Administered 2011-08-17: 15 mg via ORAL
  Filled 2011-08-17 (×2): qty 2

## 2011-08-17 MED ORDER — SODIUM CHLORIDE 0.9 % IJ SOLN
3.0000 mL | Freq: Two times a day (BID) | INTRAMUSCULAR | Status: DC
  Administered 2011-08-18 – 2011-08-21 (×7): 3 mL via INTRAVENOUS
  Filled 2011-08-17 (×3): qty 3

## 2011-08-17 MED ORDER — GADOBENATE DIMEGLUMINE 529 MG/ML IV SOLN
20.0000 mL | Freq: Once | INTRAVENOUS | Status: AC | PRN
  Administered 2011-08-17: 20 mL via INTRAVENOUS

## 2011-08-17 MED ORDER — TETRACAINE HCL 0.5 % OP SOLN
OPHTHALMIC | Status: AC
  Filled 2011-08-17: qty 2

## 2011-08-17 MED ORDER — SODIUM CHLORIDE 0.9 % IV SOLN: 250.0000 mL | INTRAVENOUS | Status: DC | PRN

## 2011-08-17 MED ORDER — TETRACAINE HCL 0.5 % OP SOLN
1.0000 [drp] | Freq: Once | OPHTHALMIC | Status: AC
  Administered 2011-08-17: 12:00:00 via OPHTHALMIC

## 2011-08-17 MED ORDER — SIMVASTATIN 20 MG PO TABS
40.0000 mg | ORAL_TABLET | Freq: Every day | ORAL | Status: DC
  Administered 2011-08-17 – 2011-08-22 (×6): 40 mg via ORAL
  Filled 2011-08-17: qty 2
  Filled 2011-08-17 (×3): qty 1
  Filled 2011-08-17 (×5): qty 2

## 2011-08-17 NOTE — ED Notes (Signed)
Attempted x 2 to start 2nd IV but was unsuccessful.

## 2011-08-17 NOTE — Consult Note (Signed)
ANTICOAGULATION CONSULT NOTE - Initial Consult  Pharmacy Consult for Heparin Indication: atrial fibrillation  Allergies  Allergen Reactions  . Other     Mayonnaise  . Penicillins Rash   Patient Measurements: Height: 6\' 1"  (185.4 cm) Weight: 267 lb 11.2 oz (121.428 kg) IBW/kg (Calculated) : 79.9   Vital Signs: Temp: 97.2 F (36.2 C) (12/12 2215) Temp src: Oral (12/12 1706) BP: 132/74 mmHg (12/12 2215) Pulse Rate: 91  (12/12 2215)  Labs:  Basename 08/17/11 2145 08/17/11 1231  HGB -- 14.6  HCT -- 43.4  PLT -- 199  APTT -- --  LABPROT -- 13.0  INR -- 0.96  HEPARINUNFRC 0.28* --  CREATININE -- 1.13  CKTOTAL -- --  CKMB -- --  TROPONINI -- --   Estimated Creatinine Clearance: 100.8 ml/min (by C-G formula based on Cr of 1.13).  Medical History: Past Medical History  Diagnosis Date  . Hyperlipidemia   . Gilbert's syndrome   . Atrial fibrillation   . Vertigo   . Diabetes mellitus    Medications:  Coumadin 15mg  daily at home per records  Assessment: Heparin level sub therapeutic  Goal of Therapy: Heparin level 0.3 to 0.7     Plan: Increase Heparin rate to 1850 units per hour  Heparin level at 7 AM CBC daily x 3  [name] 08/17/2011,10:56 PM

## 2011-08-17 NOTE — ED Notes (Signed)
Pt states he can not see out of his left eye. Pt states he can make out a little light but not much. Pt states he had similar sx with right eye yesterday but it passed. Pt states he has been having pain around his left eye socket took some ibuprofen and it went away.

## 2011-08-17 NOTE — ED Provider Notes (Signed)
This chart was scribed for Joya Gaskins, MD by Wallis Mart. The patient was seen in room APA18/[facility]18 and the patient's care was started at 12:03 PM.   CSN: [id number] Arrival date & time: 08/17/2011 11:53 AM   First MD Initiated Contact with Patient 08/17/11 1156      Chief Complaint  Patient presents with  . Visual Field Change     Patient is a 55 y.o. male presenting with eye pain.  Eye Pain This is a new problem. The current episode started yesterday. The problem occurs constantly. The problem has not changed since onset.The symptoms are aggravated by nothing. The symptoms are relieved by nothing. He has tried nothing for the symptoms.   [name] is a 55 y.o. male who presents to the Emergency Department complaining of vision impairment in left eye. Pt states that he cannot see out of left eye but can make out some light.  Pt denies eye pain or eye injury.  Pt states that yesterday he had similar sx in right eye for around 20 minutes. Last night,  Pt had weakness in right arm and c/o headache (around left eye socket)  that improved with ibuprofin.  Pt denies chest pain, abdominal pain, headache.    Past Medical History  Diagnosis Date  . Hyperlipidemia   . Gilbert's syndrome   . Atrial fibrillation   . Vertigo   . Diabetes mellitus     Past Surgical History  Procedure Date  . Achilles tendon repair     Left    Family History  Problem Relation Age of Onset  . Diabetes Mother   . Emphysema Father   . Heart failure Father   . Heart attack Brother   . Aneurysm Sister     History  Substance Use Topics  . Smoking status: Current Everyday Smoker -- 0.0 packs/day for 10 years    Types: Cigars  . Smokeless tobacco: Never Used  . Alcohol Use: 1.8 oz/week    3 Cans of beer per week     beer occasionally      Review of Systems  Eyes: Positive for pain.  All other systems reviewed and are negative.    Allergies  Penicillins  Home Medications    Current Outpatient Rx  Name Route Sig Dispense Refill  . ACETAMINOPHEN 325 MG PO TABS Oral Take 325 mg by mouth every 6 (six) hours as needed. pain     . FUROSEMIDE 20 MG PO TABS Oral Take 1 tablet (20 mg total) by mouth daily. 30 tablet 2  . HYDROCODONE-ACETAMINOPHEN 5-325 MG PO TABS  1 or 2 po q4h prn pain 20 tablet 0  . METHOCARBAMOL 500 MG PO TABS  2 po tid for spasm/pain 30 tablet 0  . METOPROLOL SUCCINATE ER 50 MG PO TB24 Oral Take 1 tablet (50 mg total) by mouth 2 (two) times daily. 90 tablet 4  . SIMVASTATIN 20 MG PO TABS Oral Take 20 mg by mouth at bedtime.      . WARFARIN SODIUM 10 MG PO TABS Oral Take 15 mg by mouth daily. As directed by Anticoagulation clinic.       BP 132/77  Pulse 77  Temp(Src) 98.6 F (37 C) (Oral)  Resp 18  Ht 6\' 1"  (1.854 m)  Wt 277 lb (125.646 kg)  BMI 36.55 kg/m2  SpO2 100%  Physical Exam CONSTITUTIONAL: Well developed/well nourished HEAD AND FACE: Normocephalic/atraumatic EYES: +EOMI Left pupil dilated but reactive, also has consensual reflex  as well.  No corneal hazing IOP less than 20 in each eye.  Pt unable to see light out of OS.  No signs of trauma.  No conjunctival injection.  No proptosis.  No nystagmus Fundoscopic exam limited but does not reveal obvious papilledema or obvious signs of CRVO/CRAO ENMT: Mucous membranes moist NECK: supple no meningeal signs, no bruits SPINE:entire spine nontender CV: irregular, no murmurs LUNGS: Lungs are clear to auscultation bilaterally, no apparent distress ABDOMEN: soft, nontender, no rebound or guarding GU:no cva tenderness NEURO:Awake/alert, facies symmetric, no arm or leg drift is noted Cranial nerves 4/5/6/03/13/09/11/12 tested and intact EXTREMITIES: pulses normal, full ROM SKIN: warm, color normal PSYCH: no abnormalities of mood noted ED Course  Procedures  DIAGNOSTIC STUDIES: Oxygen Saturation is 100% on room air, norrmal by my interpretation.    COORDINATION OF  CARE:      12:13 PM tPA in stroke considered but not given due to:  Onset over 3-4.5hours Pt had some symptoms yesterday, improved and unclear time of onset today, not intervention candidate  12:15 PM No signs of glaucoma.  Could be CVA given his recent right UE weakness that resolved  1:03 PM Ct head negative will get mr imaging Will also consult teleneuro  1:38 PM D/w teleneuro, recommend mr/mra and admission Saw patient and feels could be retinal artery occlusion but needs mr imaging  2:10 PM D/w dr Lita Mains, try acetazolomide 500mg  IV if this is retinal artery occlusion Admit medicine D/w dr Sherrie Mustache will admit   MDM  Nursing notes reviewed and considered in documentation All labs/vitals reviewed and considered    Date: 08/17/2011  Rate: 99  Rhythm: atrial fibrillation  QRS Axis: normal  Intervals: normal  ST/T Wave abnormalities: nonspecific ST changes  Conduction Disutrbances:none  Narrative Interpretation:   Old EKG Reviewed: unchanged      I personally performed the services described in this documentation, which was scribed in my presence. The recorded information has been reviewed and considered.  Tommy Medal, MD 08/17/11 [id number]

## 2011-08-17 NOTE — Consult Note (Signed)
ANTICOAGULATION CONSULT NOTE - Initial Consult  Pharmacy Consult for Heparin and Coumadin Indication: atrial fibrillation  Allergies  Allergen Reactions  . Penicillins Rash   Patient Measurements: Height: 6\' 1"  (185.4 cm) Weight: 277 lb (125.646 kg) IBW/kg (Calculated) : 79.9   Vital Signs: Temp: 98.4 F (36.9 C) (12/12 1540) Temp src: Oral (12/12 1130) BP: 116/82 mmHg (12/12 1328) Pulse Rate: 85  (12/12 1328)  Labs:  Basename 08/17/11 1231  HGB 14.6  HCT 43.4  PLT 199  APTT --  LABPROT 13.0  INR 0.96  HEPARINUNFRC --  CREATININE 1.13  CKTOTAL --  CKMB --  TROPONINI --   Estimated Creatinine Clearance: 102.6 ml/min (by C-G formula based on Cr of 1.13).  Medical History: Past Medical History  Diagnosis Date  . Hyperlipidemia   . Gilbert's syndrome   . Atrial fibrillation   . Vertigo   . Diabetes mellitus    Medications:  Coumadin 15mg  daily at home per records  Assessment: Full anticoagulation with heparin until Coumadin therapeutic  Goal of Therapy:  INR 2-3   Plan: Heparin 3000 unit bolus then 18 units/kg/hr Heparin level in 6 hrs and daily, adjust as needed Coumadin 15mg  today INR daily CBC daily x 3  Davinity Fanara A 08/17/2011,4:01 PM

## 2011-08-17 NOTE — H&P (Signed)
PCP:  [name], MD, MD   DOA:  08/17/2011 11:53 AM  Chief Complaint:  Acute loss of vision over left eye x 1 day  HPI: 55 y/o obese AA male with hx of , afib on coumadin, dyslipidemia, diabetes mellitus not on meds presented to the ED with acute loss of left ocular vision. As per patient he was in his usual state of health until yesterday when he was going to work and started having loss of lateral field of vision of his right eye which last for few minutes and resolved subsequently. While at work he was transiently unable to put his wallet into his pocket with his right hand. He felt fine during the rest of the day but when he work up this morning he was unable to see from his left eye. He denies any pain, headache, dizziness, nausea, vomiting, weakness, chest pain , palpitation of SOB. denies similar symptoms past. Denies any change in weight or appetite. informs being compliant to  his meds but has not taken coumadin for past few days.   In the ED , he was not given tPA as he was beyond the therapeutic window.patient was unable to read the eye chart from his left eye head CT was done which was unremarkable. a tele neurology consult was called who recommended getting MRI/ MRA of brain , 2d echo and checking ESR and CRP. Opthalmology consult Dr Bing Plume called from ED and recommdned givning a dose of IV acetazolamide if retinal artery occlusion was a concern. MRI / MRA done in ED showed an acute left PICA infarct.  Allergies: Allergies  Allergen Reactions  . Other     Mayonnaise  . Penicillins Rash    Prior to Admission medications   Medication Sig Start Date End Date Taking? Authorizing Provider  metoprolol (TOPROL-XL) 50 MG 24 hr tablet Take 1 tablet (50 mg total) by mouth 2 (two) times daily. 11/23/10  Yes [name], NP  simvastatin (ZOCOR) 20 MG tablet Take 20 mg by mouth at bedtime.     Yes Historical Provider, MD  warfarin (COUMADIN) 10 MG tablet Take 15 mg by mouth daily. As  directed by Anticoagulation clinic. 11/23/10  Yes [name], NP    Past Medical History  Diagnosis Date  . Hyperlipidemia   . Gilbert's syndrome   . Atrial fibrillation   . Vertigo   . Diabetes mellitus     Past Surgical History  Procedure Date  . Achilles tendon repair     Left    Social History:  reports that he has been smoking Cigars.  He has never used smokeless tobacco. He reports that he drinks about 1.8 ounces of alcohol per week. He reports that he does not use illicit drugs.  Family History  Problem Relation Age of Onset  . Diabetes Mother   . Emphysema Father   . Heart failure Father   . Heart attack Brother   . Aneurysm Sister     Review of Systems:  Constitutional: Denies fever, chills, diaphoresis, appetite change and fatigue.  HEENT: painless loss of vision of left  eye, Denies photophobia, eye pain, redness, hearing loss, ear pain, congestion, sore throat, rhinorrhea, sneezing, mouth sores, trouble swallowing, neck pain, neck stiffness and tinnitus.   Respiratory: Denies SOB, DOE, cough, chest tightness,  and wheezing.   Cardiovascular: Denies chest pain, palpitations and leg swelling.  Gastrointestinal: Denies nausea, vomiting, abdominal pain, diarrhea, constipation, blood in stool and abdominal distention.  Genitourinary: Denies dysuria, urgency,  frequency, hematuria, flank pain and difficulty urinating.  Musculoskeletal: Denies myalgias, back pain, joint swelling, arthralgias and gait problem.  Skin: Denies pallor, rash and wound.  Neurological: Denies dizziness, seizures, syncope, weakness, light-headedness, numbness and headaches. weakness of rt hand transiently yesterday Hematological: Denies adenopathy. Easy bruising, personal or family bleeding history  Psychiatric/Behavioral: Denies suicidal ideation, mood changes, confusion, nervousness, sleep disturbance and agitation   Physical Exam:  Filed Vitals:   08/17/11 1130 08/17/11 1328 08/17/11  1540  BP: 132/77 116/82   Pulse: 77 85   Temp: 98.6 F (37 C)  98.4 F (36.9 C)  TempSrc: Oral    Resp: 18 19   Height: 6\' 1"  (1.854 m)    Weight: 125.646 kg (277 lb)    SpO2: 100% 100%     Constitutional: middle aged male lying in bed in NAD HEENT: pupil reactive over rt eye, dilated and sluggishly reactive on left. fundoscopy done without any significant  finding . No pallor, no icterus Cardiovascular: S1&S2 irregular, no MRG, pulses symmetric and intact bilaterally Pulmonary/Chest: CTAB, no wheezes, rales, or rhonchi Abdominal: Soft. Non-tender, non-distended, bowel sounds are normal, no masses, organomegaly, or guarding present.  GU: no CVA tenderness Musculoskeletal: No joint deformities, erythema, or stiffness, ROM full and no nontender Ext: no edema and no cyanosis, pulses palpable bilaterally (DP and PT) Hematology: no cervical, inginal, or axillary adenopathy.  Neurological: A&O x3, cranial Ns II-XII intact except for left pupilary change. Horizontal nnystagmus on left sided gaze. Strenght is normal and symmetric bilaterally,no focal motor deficit, sensory intact to light touch bilaterally.  Skin: Warm, dry and intact. No rash, cyanosis, or clubbing.  Psychiatric: Normal mood and affect. speech and behavior is normal. Judgment and thought content normal. Cognition and memory are normal.   Labs on Admission:  Results for orders placed during the hospital encounter of 08/17/11 (from the past 48 hour(s))  BASIC METABOLIC PANEL     Status: Abnormal   Collection Time   08/17/11 12:31 PM      Component Value Range Comment   Sodium 137  135 - 145 (mEq/L)    Potassium 4.0  3.5 - 5.1 (mEq/L)    Chloride 106  96 - 112 (mEq/L)    CO2 22  19 - 32 (mEq/L)    Glucose, Bld 94  70 - 99 (mg/dL)    BUN 18  6 - 23 (mg/dL)    Creatinine, Ser 1.61  0.50 - 1.35 (mg/dL)    Calcium 9.8  8.4 - 10.5 (mg/dL)    GFR calc non Af Amer 71 (*) >90 (mL/min)    GFR calc Af Amer 83 (*) >90 (mL/min)     CBC     Status: Normal   Collection Time   08/17/11 12:31 PM      Component Value Range Comment   WBC 7.6  4.0 - 10.5 (K/uL)    RBC 4.82  4.22 - 5.81 (MIL/uL)    Hemoglobin 14.6  13.0 - 17.0 (g/dL)    HCT 09.6  04.5 - 40.9 (%)    MCV 90.0  78.0 - 100.0 (fL)    MCH 30.3  26.0 - 34.0 (pg)    MCHC 33.6  30.0 - 36.0 (g/dL)    RDW 81.1  91.4 - 78.2 (%)    Platelets 199  150 - 400 (K/uL)   DIFFERENTIAL     Status: Normal   Collection Time   08/17/11 12:31 PM      Component Value Range  Comment   Neutrophils Relative 64  43 - 77 (%)    Neutro Abs 4.8  1.7 - 7.7 (K/uL)    Lymphocytes Relative 23  12 - 46 (%)    Lymphs Abs 1.8  0.7 - 4.0 (K/uL)    Monocytes Relative 10  3 - 12 (%)    Monocytes Absolute 0.8  0.1 - 1.0 (K/uL)    Eosinophils Relative 2  0 - 5 (%)    Eosinophils Absolute 0.2  0.0 - 0.7 (K/uL)    Basophils Relative 0  0 - 1 (%)    Basophils Absolute 0.0  0.0 - 0.1 (K/uL)   PROTIME-INR     Status: Normal   Collection Time   08/17/11 12:31 PM      Component Value Range Comment   Prothrombin Time 13.0  11.6 - 15.2 (seconds)    INR 0.96  0.00 - 1.49    SEDIMENTATION RATE     Status: Abnormal   Collection Time   08/17/11  1:29 PM      Component Value Range Comment   Sed Rate 18 (*) 0 - 16 (mm/hr)     Radiological Exams on Admission: Clinical Data: 54 year old male with bilateral visual loss times 2  days. Bilateral hand numbness.  Contrast: 20mL MULTIHANCE GADOBENATE DIMEGLUMINE 529 MG/ML IV SOLN  Comparison: Head CTs 08/17/2011, 04/21/2010.  MRI HEAD WITHOUT AND WITH CONTRAST  Technique: Multiplanar, multiecho pulse sequences of the brain and  surrounding structures were obtained according to standard protocol  without and with intravenous contrast.  Findings: Study is intermittently degraded by motion artifact  despite repeated imaging attempts.  There is a smaller cortically based restricted diffusion in the  left occipital lobe, cephalad to the calcarine  fissure. There is  associated T2 and FLAIR hyperintensity. No mass effect or  hemorrhage.  Major intracranial vascular flow voids absent flow void in the left  ICA siphon and visualized distal left cervical ICA. See MRA  findings below. Other major intracranial vascular flow voids are  preserved.  No abnormal enhancement identified. No ventriculomegaly. No acute  intracranial hemorrhage identified. No intracranial mass lesion.  Negative visualized cervical spine. Normal bone marrow signal.  Negative pituitary. Visualized orbit soft tissues are within normal  limits. Small fluid levels in both maxillary sinuses. Mastoids  are clear. Negative scalp soft tissues.  IMPRESSION:  1. Small acute left PCA infarct involving the left occipital  lobe/cuneus. No mass effect or hemorrhage.  2. Left ICA occlusion or slow flow, likely chronic in the absence  of left anterior circulation findings. MRA findings are below.  MRA NECK WITHOUT AND WITH CONTRAST  Technique: Angiographic images of the neck were obtained using MRA  technique without and with intravenous contrast. Carotid stenosis  measurements (when applicable) are obtained utilizing NASCET  criteria, using the distal internal carotid diameter as the  denominator.  Findings: Precontrast time-of-flight images reveal antegrade flow  in both common carotid and vertebral arteries. There is no  antegrade flow signal evident in the cervical left ICA. Antegrade  signal continues in the right ICA and both vertebral arteries.  Following contrast, bovine arch configuration is noted. Common  carotid artery origins are within normal limits.  The right common carotid artery is within normal limits. The right  carotid bifurcation is mildly irregular. Filling defect in the  posterior right ICA origin and bulb is compatible with  atherosclerotic plaque. Subsequent stenosis is less than 50 % with  respect to the distal  vessel. The cervical right ICA  otherwise is  normal.  Mildly tortuous left common carotid artery which otherwise appears  within normal limits. There is little irregularity at the left  carotid bifurcation. The left ICA origin appears patent but  abruptly occludes 9 mm later at the level of the distal bulb  (series 50 image 6). Left ECA remains patent.  Both vertebral artery origins are within normal limits, the right  is less well visualized. The vertebral arteries are codominant and  within normal limits throughout the neck.   IMPRESSION:  1. Abrupt occlusion of the left ICA just beyond its origin.  2. Mild atherosclerosis at the right carotid bifurcation without  significant right ICA stenosis.  3. Negative cervical vertebral arteries.  4. Intracranial findings are below.  MRA HEAD WITHOUT CONTRAST  Technique: Angiographic images of the Circle of Willis were  obtained using MRA technique without intravenous contrast.  Findings: Antegrade flow in the posterior circulation with  codominant distal vertebral arteries. The right PICA is patent.  There is a dominant appearing left AICA.  The distal left vertebral artery is within normal limits. There is  moderate to severe irregularity at the right vertebrobasilar  junction and continuing into the proximal and mid basilar artery.  These time-of-flight images suggest mild to moderate basilar  stenosis, but the same basilar artery segment visualized post  contrast on the above MRA appears without significant stenosis.  The distal basilar is patent and within normal limits. Superior  cerebellar arteries and PCA origins are within normal limits.  There is moderate irregularity of the bilateral PCA branches.  Stenosis appears worst in the right P2 segment. Distal PCA flow  appears relatively symmetric. Both posterior communicating  arteries are present, the right is diminutive.  No antegrade flow in the left ICA siphon. Flow at the left ICA  terminus is reconstituted  probably from the left posterior  communicating artery.  Antegrade flow in the right ICA siphon. Cavernous segment  irregularity without right ICA stenosis. Patent right ICA  terminus.  Decreased left ACA A1 segment flow. Normal right ACA. Beyond the  anterior communicating artery ACA branch flow appears symmetric.  Normal visualized right MCA branches. Mildly attenuated flow in  the left MCA M1 segment. Mild to moderately attenuated left MCA  branch flow compared the right, more so in the anterior sylvian  division.  IMPRESSION:  1. Occluded ICA siphon. Reconstituted flow at the left ICA  terminus probably from the posterior communicating artery. Mildly  attenuated flow in the left MCA territory.  2. Atherosclerosis and stenosis at the right vertebrobasilar  junction, but these intracranial MRA images likely overestimate the  degree of proximal basilar stenosis - the post contrast images of  the basilar artery on the neck MRA portion do not indicate any  proximal basilar stenosis.   EKG: afib at 99, no ST-T changes  Assessment 55 y/o obese male with hx of  afib on coumadin, diet controlled DM presents with acute onset of painless loss of vision over left eye since this am with findings of left PCA infarct on MRI MRA with abrupt occlusion of left ICA.  Plan:  acute left PCA infarct and occluded left ICA with left monocular visual loss  patient will be admitted to tele Order ASA 325 mg daily  statin 40 mg daily Will provide therapeutic anticoagulation with heparin bridge to coumadin as INR sub therapeutic significant occlusion of ICA noted on MRI. Acute CVA possible from the carotid  diseases vs his AFib.  neurology consult called on admission and waiting to hear back' i spoke with vascular sx on call ( Dr Hart Rochester) and discussed over the phone. Given his ICA is already occluded and has blindness already he would not benefit from undergoing surgical intervention and has to be managed  medically along.  will get 2D echo and carotid US. PT eval Strict blood glucose control  check lipid panel and A1C in am Follow up with neurology recs  afib  rate controlled  starting therapeutic AC with heparin to bridge for coumadin 2d echo in am   DM:  monitor fsg and SSI  check A1C  DVT prophylaxis: on therapeutic heparin with coumadin  Diet: cardiac and diabetic   Time Spent on Admission: 45 minutes  Jovonda Selner 08/17/2011, 4:30 PM

## 2011-08-17 NOTE — ED Notes (Signed)
PT reports noticed yesterday evening around 5pm could not see well out of his R eye.  Says looked at his hand and could not see all of his hand.   Also c/o hands tingling.  Reports vision came back and he went to work.  Pt says was trying to put something in his back pocket but was not coordinated enough to do it.  Says after a few minutes felt back to normal.  Reports this am around 1030 he could not see out of left eye.  Reports had some pain around left eye yesterday but today denies pain.  Pt unable to see visual acuity chart with left eye.  Examined eyes with pen light but pt says could not see the light when shined in left eye.  Pupil is sluggish to react. EDP notified immediately and edp at bedside.  Pt's speech normal, face symmetrical, grips strong and equal.

## 2011-08-18 ENCOUNTER — Encounter (HOSPITAL_COMMUNITY): Payer: Self-pay | Admitting: General Practice

## 2011-08-18 DIAGNOSIS — I059 Rheumatic mitral valve disease, unspecified: Secondary | ICD-10-CM

## 2011-08-18 DIAGNOSIS — I6529 Occlusion and stenosis of unspecified carotid artery: Secondary | ICD-10-CM

## 2011-08-18 LAB — GLUCOSE, CAPILLARY: Glucose-Capillary: 94 mg/dL (ref 70–99)

## 2011-08-18 LAB — LIPID PANEL
Cholesterol: 193 mg/dL (ref 0–200)
Triglycerides: 80 mg/dL (ref <150)

## 2011-08-18 LAB — CBC
MCHC: 34 g/dL (ref 30.0–36.0)
Platelets: 204 10*3/uL (ref 150–400)
RDW: 13.2 % (ref 11.5–15.5)

## 2011-08-18 LAB — HEMOGLOBIN A1C
Hgb A1c MFr Bld: 5.8 % — ABNORMAL HIGH
Mean Plasma Glucose: 120 mg/dL — ABNORMAL HIGH

## 2011-08-18 LAB — HEPARIN LEVEL (UNFRACTIONATED): Heparin Unfractionated: 0.17 IU/mL — ABNORMAL LOW (ref 0.30–0.70)

## 2011-08-18 LAB — PROTIME-INR: INR: 1.08 (ref 0.00–1.49)

## 2011-08-18 LAB — C-REACTIVE PROTEIN: CRP: 0.19 mg/dL — ABNORMAL LOW

## 2011-08-18 MED ORDER — ACETAMINOPHEN 325 MG PO TABS
650.0000 mg | ORAL_TABLET | ORAL | Status: DC | PRN
  Administered 2011-08-18 – 2011-08-22 (×5): 650 mg via ORAL
  Filled 2011-08-18: qty 1
  Filled 2011-08-18 (×5): qty 2

## 2011-08-18 MED ORDER — POTASSIUM CHLORIDE IN NACL 20-0.9 MEQ/L-% IV SOLN
INTRAVENOUS | Status: DC
  Administered 2011-08-18: 19:00:00 via INTRAVENOUS
  Filled 2011-08-18 (×3): qty 1000

## 2011-08-18 MED ORDER — WARFARIN SODIUM 7.5 MG PO TABS: 15.0000 mg | ORAL_TABLET | Freq: Once | ORAL | Status: DC

## 2011-08-18 NOTE — Plan of Care (Signed)
Problem: Phase I Progression Outcomes Goal: Pain controlled with appropriate interventions Outcome: Not Applicable Date Met:  08/18/11 Denies pain at this time.

## 2011-08-18 NOTE — Consult Note (Signed)
Reason for Consult: Referring Physician:   [name] is an 55 y.o. male.  HPI:   Past Medical History  Diagnosis Date  . Hyperlipidemia   . Gilbert's syndrome   . Atrial fibrillation   . Vertigo   . Diabetes mellitus     Past Surgical History  Procedure Date  . Achilles tendon repair     Left    Family History  Problem Relation Age of Onset  . Diabetes Mother   . Emphysema Father   . Heart failure Father   . Heart attack Brother   . Aneurysm Sister     Social History:  reports that he has been smoking Cigars.  He has never used smokeless tobacco. He reports that he drinks about 1.8 ounces of alcohol per week. He reports that he does not use illicit drugs.  Allergies:  Allergies  Allergen Reactions  . Other     Mayonnaise  . Penicillins Rash    Medications:  Prior to Admission medications   Medication Sig Start Date End Date Taking? Authorizing Provider  metoprolol (TOPROL-XL) 50 MG 24 hr tablet Take 1 tablet (50 mg total) by mouth 2 (two) times daily. 11/23/10  Yes [name], NP  simvastatin (ZOCOR) 20 MG tablet Take 20 mg by mouth at bedtime.     Yes Historical Provider, MD  warfarin (COUMADIN) 10 MG tablet Take 15 mg by mouth daily. As directed by Anticoagulation clinic. 11/23/10  Yes [name], NP    Scheduled Meds:   . acetaZOLAMIDE  500 mg Intravenous Once  . aspirin EC  325 mg Oral Daily  . heparin  3,000 Units Intravenous Once  . insulin aspart  0-15 Units Subcutaneous TID WC  . isosorbide mononitrate  30 mg Oral Daily  . metoprolol  50 mg Oral BID  . simvastatin  40 mg Oral Daily  . sodium chloride  3 mL Intravenous Q12H  . tetracaine  1 drop Both Eyes Once  . warfarin  15 mg Oral ONCE-1800   Continuous Infusions:   . heparin 1,850 Units/hr (08/18/11 0314)  . DISCONTD: heparin 17.821 Units/kg/hr (08/17/11 1834)   PRN Meds:.sodium chloride, acetaminophen, gadobenate dimeglumine, sodium chloride   Results for orders placed  during the hospital encounter of 08/17/11 (from the past 48 hour(s))  BASIC METABOLIC PANEL     Status: Abnormal   Collection Time   08/17/11 12:31 PM      Component Value Range Comment   Sodium 137  135 - 145 (mEq/L)    Potassium 4.0  3.5 - 5.1 (mEq/L)    Chloride 106  96 - 112 (mEq/L)    CO2 22  19 - 32 (mEq/L)    Glucose, Bld 94  70 - 99 (mg/dL)    BUN 18  6 - 23 (mg/dL)    Creatinine, Ser 1.47  0.50 - 1.35 (mg/dL)    Calcium 9.8  8.4 - 10.5 (mg/dL)    GFR calc non Af Amer 71 (*) >90 (mL/min)    GFR calc Af Amer 83 (*) >90 (mL/min)   CBC     Status: Normal   Collection Time   08/17/11 12:31 PM      Component Value Range Comment   WBC 7.6  4.0 - 10.5 (K/uL)    RBC 4.82  4.22 - 5.81 (MIL/uL)    Hemoglobin 14.6  13.0 - 17.0 (g/dL)    HCT 82.9  56.2 - 13.0 (%)    MCV 90.0  78.0 -  100.0 (fL)    MCH 30.3  26.0 - 34.0 (pg)    MCHC 33.6  30.0 - 36.0 (g/dL)    RDW 40.9  81.1 - 91.4 (%)    Platelets 199  150 - 400 (K/uL)   DIFFERENTIAL     Status: Normal   Collection Time   08/17/11 12:31 PM      Component Value Range Comment   Neutrophils Relative 64  43 - 77 (%)    Neutro Abs 4.8  1.7 - 7.7 (K/uL)    Lymphocytes Relative 23  12 - 46 (%)    Lymphs Abs 1.8  0.7 - 4.0 (K/uL)    Monocytes Relative 10  3 - 12 (%)    Monocytes Absolute 0.8  0.1 - 1.0 (K/uL)    Eosinophils Relative 2  0 - 5 (%)    Eosinophils Absolute 0.2  0.0 - 0.7 (K/uL)    Basophils Relative 0  0 - 1 (%)    Basophils Absolute 0.0  0.0 - 0.1 (K/uL)   PROTIME-INR     Status: Normal   Collection Time   08/17/11 12:31 PM      Component Value Range Comment   Prothrombin Time 13.0  11.6 - 15.2 (seconds)    INR 0.96  0.00 - 1.49    C-REACTIVE PROTEIN     Status: Abnormal   Collection Time   08/17/11 12:31 PM      Component Value Range Comment   CRP 0.19 (*) <0.60 (mg/dL)   SEDIMENTATION RATE     Status: Abnormal   Collection Time   08/17/11  1:29 PM      Component Value Range Comment   Sed Rate 18 (*) 0 - 16  (mm/hr)   GLUCOSE, CAPILLARY     Status: Normal   Collection Time   08/17/11  5:35 PM      Component Value Range Comment   Glucose-Capillary 88  70 - 99 (mg/dL)    Comment 1 Documented in Chart      Comment 2 Notify RN     GLUCOSE, CAPILLARY     Status: Normal   Collection Time   08/17/11  8:36 PM      Component Value Range Comment   Glucose-Capillary 83  70 - 99 (mg/dL)    Comment 1 Notify RN     HEPARIN LEVEL (UNFRACTIONATED)     Status: Abnormal   Collection Time   08/17/11  9:45 PM      Component Value Range Comment   Heparin Unfractionated 0.28 (*) 0.30 - 0.70 (IU/mL)   CBC     Status: Normal   Collection Time   08/18/11  6:33 AM      Component Value Range Comment   WBC 8.0  4.0 - 10.5 (K/uL)    RBC 5.18  4.22 - 5.81 (MIL/uL)    Hemoglobin 15.6  13.0 - 17.0 (g/dL)    HCT 78.2  95.6 - 21.3 (%)    MCV 88.6  78.0 - 100.0 (fL)    MCH 30.1  26.0 - 34.0 (pg)    MCHC 34.0  30.0 - 36.0 (g/dL)    RDW 08.6  57.8 - 46.9 (%)    Platelets 204  150 - 400 (K/uL)   HEPARIN LEVEL (UNFRACTIONATED)     Status: Normal   Collection Time   08/18/11  6:33 AM      Component Value Range Comment   Heparin Unfractionated 0.60  0.30 - 0.70 (  IU/mL)   LIPID PANEL     Status: Abnormal   Collection Time   08/18/11  6:41 AM      Component Value Range Comment   Cholesterol 193  0 - 200 (mg/dL)    Triglycerides 80  <161 (mg/dL)    HDL 41  >09 (mg/dL)    Total CHOL/HDL Ratio 4.7      VLDL 16  0 - 40 (mg/dL)    LDL Cholesterol 604 (*) 0 - 99 (mg/dL)   GLUCOSE, CAPILLARY     Status: Normal   Collection Time   08/18/11  7:17 AM      Component Value Range Comment   Glucose-Capillary 94  70 - 99 (mg/dL)    Comment 1 Documented in Chart      Comment 2 Notify RN       Ct Head Wo Contrast  08/17/2011  *RADIOLOGY REPORT*  Clinical Data: Visual field changes in the left thigh.  CT HEAD WITHOUT CONTRAST  Technique:  Contiguous axial images were obtained from the base of the skull through the vertex  without contrast.  Comparison: 04/21/2010  Findings: No acute intracranial abnormality.  Specifically, no hemorrhage, hydrocephalus, mass lesion, acute infarction, or significant intracranial injury.  No acute calvarial abnormality.  Short air fluid levels are seen in the maxillary sinuses bilaterally, a nonspecific finding.  This may reflect acute sinusitis.  IMPRESSION: No acute intracranial abnormality.  Small air-fluid levels in the maxillary sinuses bilaterally.  Original Report Authenticated By: Cyndie Chime, M.D.   Mr Angiogram Head Wo Contrast  08/17/2011  *RADIOLOGY REPORT*  Clinical Data:  55 year old male with bilateral visual loss times 2 days.  Bilateral hand numbness.  Contrast: 20mL MULTIHANCE GADOBENATE DIMEGLUMINE 529 MG/ML IV SOLN  Comparison: Head CTs 08/17/2011, 04/21/2010.  MRI HEAD WITHOUT AND WITH CONTRAST  Technique: Multiplanar, multiecho pulse sequences of the brain and surrounding structures were obtained according to standard protocol without and with intravenous contrast.  Findings:  Study is intermittently degraded by motion artifact despite repeated imaging attempts.  There is a smaller cortically based restricted diffusion in the left occipital lobe, cephalad to the calcarine fissure.  There is associated T2 and FLAIR hyperintensity.  No mass effect or hemorrhage.  Major intracranial vascular flow voids absent flow void in the left ICA siphon and visualized distal left cervical ICA.  See MRA findings below.  Other major intracranial vascular flow voids are preserved.   No abnormal enhancement identified.  No ventriculomegaly. No acute intracranial hemorrhage identified.  No intracranial mass lesion. Negative visualized cervical spine.  Normal bone marrow signal. Negative pituitary. Visualized orbit soft tissues are within normal limits.  Small fluid levels in both maxillary sinuses.  Mastoids are clear.  Negative scalp soft tissues.  IMPRESSION: 1.  Small acute left PCA infarct  involving the left occipital lobe/cuneus.  No mass effect or hemorrhage. 2.  Left ICA occlusion or slow flow, likely chronic in the absence of left anterior circulation findings. MRA findings are below.  MRA NECK WITHOUT AND WITH CONTRAST  Technique:  Angiographic images of the neck were obtained using MRA technique without and with intravenous contrast.  Carotid stenosis measurements (when applicable) are obtained utilizing NASCET criteria, using the distal internal carotid diameter as the denominator.  Findings:  Precontrast time-of-flight images reveal antegrade flow in both common carotid and vertebral arteries.  There is no antegrade flow signal evident in the cervical left ICA.  Antegrade signal continues in the right ICA and both  vertebral arteries.  Following contrast, bovine arch configuration is noted.  Common carotid artery origins are within normal limits.  The right common carotid artery is within normal limits.  The right carotid bifurcation is mildly irregular.  Filling defect in the posterior right ICA origin and bulb is compatible with atherosclerotic plaque.  Subsequent stenosis is less than 50 % with respect to the distal vessel.  The cervical right ICA otherwise is normal.  Mildly tortuous left common carotid artery which otherwise appears within normal limits.  There is little irregularity at the left carotid bifurcation.  The left ICA origin appears patent but abruptly occludes 9 mm later at the level of the distal bulb (series 50 image 6).  Left ECA remains patent.  Both vertebral artery origins are within normal limits, the right is less well visualized.  The vertebral arteries are codominant and within normal limits throughout the neck.  IMPRESSION: 1.  Abrupt occlusion of the left ICA just beyond its origin. 2.  Mild atherosclerosis at the right carotid bifurcation without significant right ICA stenosis. 3.  Negative cervical vertebral arteries. 4.  Intracranial findings are below.  MRA  HEAD WITHOUT CONTRAST  Technique: Angiographic images of the Circle of Willis were obtained using MRA technique without  intravenous contrast.  Findings:  Antegrade flow in the posterior circulation with codominant distal vertebral arteries.  The right PICA is patent. There is a dominant appearing left AICA.  The distal left vertebral artery is within normal limits.  There is moderate to severe irregularity at the right vertebrobasilar junction and continuing into the proximal and mid basilar artery. These time-of-flight images suggest mild to moderate basilar stenosis, but the same basilar artery segment visualized post contrast on the above MRA appears without significant stenosis. The distal basilar is patent and within normal limits.  Superior cerebellar arteries and PCA origins are within normal limits. There is moderate irregularity of the bilateral PCA branches. Stenosis appears worst in the right P2 segment.  Distal PCA flow appears relatively symmetric.  Both posterior communicating arteries are present, the right is diminutive.  No antegrade flow in the left ICA siphon.  Flow at the left ICA terminus is reconstituted probably from the left posterior communicating artery.  Antegrade flow in the right ICA siphon.  Cavernous segment irregularity without right ICA stenosis.  Patent right ICA terminus.  Decreased left ACA A1 segment flow.  Normal right ACA.  Beyond the anterior communicating artery ACA branch flow appears symmetric. Normal visualized right MCA branches.  Mildly attenuated flow in the left MCA M1 segment.  Mild to moderately attenuated left MCA branch flow compared the right, more so in the anterior sylvian division.  IMPRESSION: 1.  Occluded ICA siphon.  Reconstituted flow at the left ICA terminus probably from the posterior communicating artery.  Mildly attenuated flow in the left MCA territory. 2.  Atherosclerosis and stenosis at the right vertebrobasilar junction, but these intracranial MRA  images likely overestimate the degree of proximal basilar stenosis - the post contrast images of the basilar artery on the neck MRA portion do not indicate any proximal basilar stenosis.  Original Report Authenticated By: Harley Hallmark, M.D.   Mr Angiogram Neck W Wo Contrast  08/17/2011  *RADIOLOGY REPORT*  Clinical Data:  54 year old male with bilateral visual loss times 2 days.  Bilateral hand numbness.  Contrast: 20mL MULTIHANCE GADOBENATE DIMEGLUMINE 529 MG/ML IV SOLN  Comparison: Head CTs 08/17/2011, 04/21/2010.  MRI HEAD WITHOUT AND WITH CONTRAST  Technique: Multiplanar,  multiecho pulse sequences of the brain and surrounding structures were obtained according to standard protocol without and with intravenous contrast.  Findings:  Study is intermittently degraded by motion artifact despite repeated imaging attempts.  There is a smaller cortically based restricted diffusion in the left occipital lobe, cephalad to the calcarine fissure.  There is associated T2 and FLAIR hyperintensity.  No mass effect or hemorrhage.  Major intracranial vascular flow voids absent flow void in the left ICA siphon and visualized distal left cervical ICA.  See MRA findings below.  Other major intracranial vascular flow voids are preserved.   No abnormal enhancement identified.  No ventriculomegaly. No acute intracranial hemorrhage identified.  No intracranial mass lesion. Negative visualized cervical spine.  Normal bone marrow signal. Negative pituitary. Visualized orbit soft tissues are within normal limits.  Small fluid levels in both maxillary sinuses.  Mastoids are clear.  Negative scalp soft tissues.  IMPRESSION: 1.  Small acute left PCA infarct involving the left occipital lobe/cuneus.  No mass effect or hemorrhage. 2.  Left ICA occlusion or slow flow, likely chronic in the absence of left anterior circulation findings. MRA findings are below.  MRA NECK WITHOUT AND WITH CONTRAST  Technique:  Angiographic images of the neck  were obtained using MRA technique without and with intravenous contrast.  Carotid stenosis measurements (when applicable) are obtained utilizing NASCET criteria, using the distal internal carotid diameter as the denominator.  Findings:  Precontrast time-of-flight images reveal antegrade flow in both common carotid and vertebral arteries.  There is no antegrade flow signal evident in the cervical left ICA.  Antegrade signal continues in the right ICA and both vertebral arteries.  Following contrast, bovine arch configuration is noted.  Common carotid artery origins are within normal limits.  The right common carotid artery is within normal limits.  The right carotid bifurcation is mildly irregular.  Filling defect in the posterior right ICA origin and bulb is compatible with atherosclerotic plaque.  Subsequent stenosis is less than 50 % with respect to the distal vessel.  The cervical right ICA otherwise is normal.  Mildly tortuous left common carotid artery which otherwise appears within normal limits.  There is little irregularity at the left carotid bifurcation.  The left ICA origin appears patent but abruptly occludes 9 mm later at the level of the distal bulb (series 50 image 6).  Left ECA remains patent.  Both vertebral artery origins are within normal limits, the right is less well visualized.  The vertebral arteries are codominant and within normal limits throughout the neck.  IMPRESSION: 1.  Abrupt occlusion of the left ICA just beyond its origin. 2.  Mild atherosclerosis at the right carotid bifurcation without significant right ICA stenosis. 3.  Negative cervical vertebral arteries. 4.  Intracranial findings are below.  MRA HEAD WITHOUT CONTRAST  Technique: Angiographic images of the Circle of Willis were obtained using MRA technique without  intravenous contrast.  Findings:  Antegrade flow in the posterior circulation with codominant distal vertebral arteries.  The right PICA is patent. There is a  dominant appearing left AICA.  The distal left vertebral artery is within normal limits.  There is moderate to severe irregularity at the right vertebrobasilar junction and continuing into the proximal and mid basilar artery. These time-of-flight images suggest mild to moderate basilar stenosis, but the same basilar artery segment visualized post contrast on the above MRA appears without significant stenosis. The distal basilar is patent and within normal limits.  Superior cerebellar arteries and  PCA origins are within normal limits. There is moderate irregularity of the bilateral PCA branches. Stenosis appears worst in the right P2 segment.  Distal PCA flow appears relatively symmetric.  Both posterior communicating arteries are present, the right is diminutive.  No antegrade flow in the left ICA siphon.  Flow at the left ICA terminus is reconstituted probably from the left posterior communicating artery.  Antegrade flow in the right ICA siphon.  Cavernous segment irregularity without right ICA stenosis.  Patent right ICA terminus.  Decreased left ACA A1 segment flow.  Normal right ACA.  Beyond the anterior communicating artery ACA branch flow appears symmetric. Normal visualized right MCA branches.  Mildly attenuated flow in the left MCA M1 segment.  Mild to moderately attenuated left MCA branch flow compared the right, more so in the anterior sylvian division.  IMPRESSION: 1.  Occluded ICA siphon.  Reconstituted flow at the left ICA terminus probably from the posterior communicating artery.  Mildly attenuated flow in the left MCA territory. 2.  Atherosclerosis and stenosis at the right vertebrobasilar junction, but these intracranial MRA images likely overestimate the degree of proximal basilar stenosis - the post contrast images of the basilar artery on the neck MRA portion do not indicate any proximal basilar stenosis.  Original Report Authenticated By: Harley Hallmark, M.D.   Mr Laqueta Jean Wo  Contrast  08/17/2011  *RADIOLOGY REPORT*  Clinical Data:  55 year old male with bilateral visual loss times 2 days.  Bilateral hand numbness.  Contrast: 20mL MULTIHANCE GADOBENATE DIMEGLUMINE 529 MG/ML IV SOLN  Comparison: Head CTs 08/17/2011, 04/21/2010.  MRI HEAD WITHOUT AND WITH CONTRAST  Technique: Multiplanar, multiecho pulse sequences of the brain and surrounding structures were obtained according to standard protocol without and with intravenous contrast.  Findings:  Study is intermittently degraded by motion artifact despite repeated imaging attempts.  There is a smaller cortically based restricted diffusion in the left occipital lobe, cephalad to the calcarine fissure.  There is associated T2 and FLAIR hyperintensity.  No mass effect or hemorrhage.  Major intracranial vascular flow voids absent flow void in the left ICA siphon and visualized distal left cervical ICA.  See MRA findings below.  Other major intracranial vascular flow voids are preserved.   No abnormal enhancement identified.  No ventriculomegaly. No acute intracranial hemorrhage identified.  No intracranial mass lesion. Negative visualized cervical spine.  Normal bone marrow signal. Negative pituitary. Visualized orbit soft tissues are within normal limits.  Small fluid levels in both maxillary sinuses.  Mastoids are clear.  Negative scalp soft tissues.  IMPRESSION: 1.  Small acute left PCA infarct involving the left occipital lobe/cuneus.  No mass effect or hemorrhage. 2.  Left ICA occlusion or slow flow, likely chronic in the absence of left anterior circulation findings. MRA findings are below.  MRA NECK WITHOUT AND WITH CONTRAST  Technique:  Angiographic images of the neck were obtained using MRA technique without and with intravenous contrast.  Carotid stenosis measurements (when applicable) are obtained utilizing NASCET criteria, using the distal internal carotid diameter as the denominator.  Findings:  Precontrast time-of-flight  images reveal antegrade flow in both common carotid and vertebral arteries.  There is no antegrade flow signal evident in the cervical left ICA.  Antegrade signal continues in the right ICA and both vertebral arteries.  Following contrast, bovine arch configuration is noted.  Common carotid artery origins are within normal limits.  The right common carotid artery is within normal limits.  The right carotid bifurcation is  mildly irregular.  Filling defect in the posterior right ICA origin and bulb is compatible with atherosclerotic plaque.  Subsequent stenosis is less than 50 % with respect to the distal vessel.  The cervical right ICA otherwise is normal.  Mildly tortuous left common carotid artery which otherwise appears within normal limits.  There is little irregularity at the left carotid bifurcation.  The left ICA origin appears patent but abruptly occludes 9 mm later at the level of the distal bulb (series 50 image 6).  Left ECA remains patent.  Both vertebral artery origins are within normal limits, the right is less well visualized.  The vertebral arteries are codominant and within normal limits throughout the neck.  IMPRESSION: 1.  Abrupt occlusion of the left ICA just beyond its origin. 2.  Mild atherosclerosis at the right carotid bifurcation without significant right ICA stenosis. 3.  Negative cervical vertebral arteries. 4.  Intracranial findings are below.  MRA HEAD WITHOUT CONTRAST  Technique: Angiographic images of the Circle of Willis were obtained using MRA technique without  intravenous contrast.  Findings:  Antegrade flow in the posterior circulation with codominant distal vertebral arteries.  The right PICA is patent. There is a dominant appearing left AICA.  The distal left vertebral artery is within normal limits.  There is moderate to severe irregularity at the right vertebrobasilar junction and continuing into the proximal and mid basilar artery. These time-of-flight images suggest mild to  moderate basilar stenosis, but the same basilar artery segment visualized post contrast on the above MRA appears without significant stenosis. The distal basilar is patent and within normal limits.  Superior cerebellar arteries and PCA origins are within normal limits. There is moderate irregularity of the bilateral PCA branches. Stenosis appears worst in the right P2 segment.  Distal PCA flow appears relatively symmetric.  Both posterior communicating arteries are present, the right is diminutive.  No antegrade flow in the left ICA siphon.  Flow at the left ICA terminus is reconstituted probably from the left posterior communicating artery.  Antegrade flow in the right ICA siphon.  Cavernous segment irregularity without right ICA stenosis.  Patent right ICA terminus.  Decreased left ACA A1 segment flow.  Normal right ACA.  Beyond the anterior communicating artery ACA branch flow appears symmetric. Normal visualized right MCA branches.  Mildly attenuated flow in the left MCA M1 segment.  Mild to moderately attenuated left MCA branch flow compared the right, more so in the anterior sylvian division.  IMPRESSION: 1.  Occluded ICA siphon.  Reconstituted flow at the left ICA terminus probably from the posterior communicating artery.  Mildly attenuated flow in the left MCA territory. 2.  Atherosclerosis and stenosis at the right vertebrobasilar junction, but these intracranial MRA images likely overestimate the degree of proximal basilar stenosis - the post contrast images of the basilar artery on the neck MRA portion do not indicate any proximal basilar stenosis.  Original Report Authenticated By: Harley Hallmark, M.D.   US Carotid Duplex Bilateral  08/18/2011  *RADIOLOGY REPORT*  Clinical Data: Left PCA infarct.  BILATERAL CAROTID DUPLEX ULTRASOUND  Technique: Wallace Cullens scale imaging, color Doppler and duplex ultrasound was performed of bilateral carotid and vertebral arteries in the neck.  Comparison:  None  Criteria:   Quantification of carotid stenosis is based on velocity parameters that correlate the residual internal carotid diameter with NASCET-based stenosis levels, using the diameter of the distal internal carotid lumen as the denominator for stenosis measurement.  The following velocity measurements were  obtained:                   PEAK SYSTOLIC/END DIASTOLIC RIGHT ICA:                        107/49cm/sec CCA:                        107/22cm/sec SYSTOLIC ICA/CCA RATIO:     1.0 DIASTOLIC ICA/CCA RATIO:    2.28 ECA:                        132cm/sec  LEFT ICA:                        18/2cm/sec CCA:                        91/9cm/sec SYSTOLIC ICA/CCA RATIO:     0.19 DIASTOLIC ICA/CCA RATIO:    0.19 ECA:                        58cm/sec  Findings:  RIGHT CAROTID ARTERY: Moderate soft plaque throughout the distal common carotid artery, carotid bulb, ICA and ECA without flow- limiting stenosis, less than 50%.  RIGHT VERTEBRAL ARTERY:  Antegrade flow.  LEFT CAROTID ARTERY: Extensive plaque formation in the carotid bulb and ICA.  Subtotal occlusion of the left ICA with only a slow trickle of flow.  LEFT VERTEBRAL ARTERY:  Antegrade flow.  IMPRESSION: Critical/subtotal occlusion of the left ICA.  Moderate disease in the right ICA without flow limiting stenosis.  Original Report Authenticated By: Cyndie Chime, M.D.    Review of Systems  HENT: Negative.   Eyes: Negative.   Respiratory: Negative.   Cardiovascular: Negative.   Gastrointestinal: Negative.   Genitourinary: Negative.   Musculoskeletal: Negative.   Skin: Negative.   Endo/Heme/Allergies: Negative.   Psychiatric/Behavioral: Negative.    Blood pressure 120/83, pulse 81, temperature 97.6 F (36.4 C), temperature source Oral, resp. rate 20, height 6\' 1"  (1.854 m), weight 121.428 kg (267 lb 11.2 oz), SpO2 92.00%. Physical Exam  Assessment/Plan: See dictation.  [name] 08/18/2011, 9:57 AM

## 2011-08-18 NOTE — Consult Note (Signed)
NAMEKONRAD, HOAK               ACCOUNT NO.:  [id number]  MEDICAL RECORD NO.:  [id number]  LOCATION:  3707                         FACILITY:  [facility]  PHYSICIAN:  Shley Dolby A. Gerilyn Pilgrim, M.D. DATE OF BIRTH:  1956/03/22  DATE OF CONSULTATION: DATE OF DISCHARGE:                                CONSULTATION   REASON FOR CONSULTATION:  Stroke.  The patient is a 55 year old black male who developed the acute onset of just not feeling right yesterday.  He presented to the hospital with acute loss of vision on the left.  Dr. Lita Mains was consulted and they are concerned that he may have had an acute retinal artery occlusion.  He did recommend acetazolamide.  The patient, however, in further questioning indicated that before he lost vision on the left eye, he had a sensation as if the car was coming down on the right eye.  This was associated with incoordination and lack of use of the right upper extremity that lasted for 10 minutes and was subsequently followed by acute loss of vision involving the left eye.  The patient is on chronic warfarin therapy because of his atrial fibrillation.  Unfortunately the INR was subtherapeutic at 0.9.  He does report being compliant with this, however.  The patient does report having headache, particularly left periorbital pain on the left associated with the acute loss of vision on the left.  For the past medical history and rest of the history, see my computer generated note.  PHYSICAL EXAMINATION:  GENERAL:  Obese pleasant man in no acute distress. HEENT:  Head is normocephalic, atraumatic.  There is a mild left exotropia. NECK:  Supple. ABDOMEN:  Soft. EXTREMITIES:  No significant edema. NEUROLOGIC:  Mentation:  He is awake and alert.  Speech is normal. Language and cognition also intact.  The patient essentially is lucid and coherent.  Cranial nerve evaluation shows complete loss of vision, left eye.  He can see nothing even to movement.  There is a  left afferent pupillary defect.  Right eye shows full visual fields.  The patient has full extraocular movements.  Facial muscle strength is symmetric.  Tongue is midline.  Uvula midline.  Shoulder shrugs normal. Funduscopic examination showed evidence of severe edema.  Left disk with some marked swelling and loss of spontaneous venous pulsations.  Motor examination shows no pronator drift.  Tone, bulk, and strength are normal throughout.  Reflexes are 2+ except for the knees with a 1+ plantar, both downgoing.  Sensation normal to temperature and light touch.  Coordination shows no dysmetria.  No tremors, no parkinsonism.  MRI is reviewed in person and shows a tiny area of acute infarct involving the left occipital area.  It is very tiny and likely would not explain the patient's symptoms by itself.  No other acute findings are noted.  MRA shows occlusion of the left internal carotid artery at the origin.  The patient seemed to have good posterior circulation including the vertebral basilar system and PCA bilaterally.  Carotid duplex Doppler shows slow flow involving the left ICA indicating a subtotal occlusion and not a complete occlusion.  ASSESSMENT:  Likely acute left internal carotid artery occlusive process.  Specifically this likely represents a primary thrombotic process as opposed to this being an issue with the patient's atrial fibrillation, although this cannot be entirely ruled out.  The atrial fibrillation is concerning as he has a small infarct involving the posterior circulation which is different territory than the main issue at play which is the left internal carotid artery subtotal occlusion. The patient's acute retinal artery infarct is likely infarct from the subtotal occlusion of the left internal carotid artery.  Additionally he seemed to have had a left hemispheric transient ischemic attack with transient weakness of the right upper extremity and loss of vision  -right visual field.  Risk factors are hypertension, diabetes, and atrial fibrillation.  I did discuss the case with Dr. Lendell Caprice.  I believe the patient should be evaluated rather urgently for possible endarterectomy involving the left internal carotid artery.  This is even more important given that it involves the dominant hemisphere.  Also suggest that once the patient is being treated acutely that probably should be placed on one of the near agents for atrial fibrillation, stroke prophylaxis such as Pradaxa/dabigatran.  Continue with other risk factor modifications. Treated hypertension, diabetes, and hypercholesteremia.     Cyndal Kasson A. Gerilyn Pilgrim, M.D.     KAD/MEDQ  D:  08/18/2011  T:  08/18/2011  Job:  [id number]

## 2011-08-18 NOTE — Progress Notes (Signed)
08/18/11 1515 Patient transferred to [facility], department 3700 via carelink. Report given to carelink nurse and called to receiving nurse at 3700, Vernona Rieger, RN. Pt left floor in stable condition this afternoon via carelink transport.

## 2011-08-18 NOTE — Progress Notes (Signed)
55 yo male with afib and acute left PCA infarct and occluded left ICA with left monocular visual loss is transferred from [facility] for vascular surgery eval.  Coumadin is on hold now.  Heparin was recently decreased to 1700 units/hr at [facility] at ~1046.  Will reck heparin level at 1700 today to evaluate dosing.

## 2011-08-18 NOTE — Progress Notes (Signed)
*  PRELIMINARY RESULTS* Echocardiogram 2D Echocardiogram has been performed.  [name] 08/18/2011, 11:24 AM

## 2011-08-18 NOTE — Consult Note (Addendum)
ANTICOAGULATION CONSULT NOTE  Pharmacy Consult for Heparin Indication: atrial fibrillation + acute left PCA infarct and occluded left ICA with left monocular visual loss   Allergies  Allergen Reactions  . Other     Mayonnaise  . Penicillins Rash   Patient Measurements: Height: 6\' 1"  (185.4 cm) Weight: 267 lb 11.2 oz (121.428 kg) IBW/kg (Calculated) : 79.9   Vital Signs: Temp: 97.6 F (36.4 C) (12/13 0545) BP: 120/83 mmHg (12/13 0545) Pulse Rate: 81  (12/13 0545)  Labs:  Basename 08/18/11 0633 08/17/11 2145 08/17/11 1231  HGB 15.6 -- 14.6  HCT 45.9 -- 43.4  PLT 204 -- 199  APTT -- -- --  LABPROT -- -- 13.0  INR -- -- 0.96  HEPARINUNFRC 0.60 0.28* --  CREATININE -- -- 1.13  CKTOTAL -- -- --  CKMB -- -- --  TROPONINI -- -- --   Estimated Creatinine Clearance: 100.8 ml/min (by C-G formula based on Cr of 1.13).  Medical History: Past Medical History  Diagnosis Date  . Hyperlipidemia   . Gilbert's syndrome   . Atrial fibrillation   . Vertigo   . Diabetes mellitus    Medications:  Coumadin 15mg  daily at home per records  Assessment: Heparin level supra therapeutic  Goal of Therapy: Change Heparin level 0.3 to 0.5     Plan: Decrease Heparin rate to 1700 units per hour  Heparin level / CBC daily  INR Last Three Days: Recent Labs  [facility] 08/18/11 0633 08/17/11 1231   INR 1.08 0.96    Repeat warfarin 15mg  PO x 1.  Consider decrease to Aspirin to 81mg .  Lamonte Richer R 08/18/2011,9:43 AM

## 2011-08-18 NOTE — Progress Notes (Signed)
   Please refer to admission H&p from 12/12 and physician transfer note from this morning for details. patient known to me from admission at [facility] yesterday.  Briefly [name] is a 55 y/o obese AA male with hx of Afib ( does not seem to be adherent to coumadin with sub therapeutic INR on presentation) , diet controlled DM , hyperlipidemia and smoker who presented to [facility] ED with painless left eye vision loss preceded with transient rt eye lateral visual field defect and rt hand weakness.   neurological evaluation revealed slugging left pupil with MRI/ Brandon showing small left PCA infarct with occlusion fo left ICA at the origin.   carotid doppler done showed near occlusion fo left ICA with trickle of flow. patient thus transferred  to [facility] for vascular surgery evaluation and further management of stroke.  patient has not recovered any vision on left eye since admission.  he is otherwise hemodynamically stable. continue monitor in tele.  cont ASA. Heparin drip to bridge for coumadin  BP control vascular surgery Dr Edilia Bo informed of patient's arrival and will follow.

## 2011-08-18 NOTE — Consult Note (Addendum)
[facility] [place]  Patient name: [name] MRN: [id number] DOB: [birth year] Sex: male  REASON FOR CONSULT: Consult for left internal carotid artery occlusion versus critical stenosis. Consult is from [facility].   HPI: [name] is a 55 y.o. male who works at [employer]. 2 days ago while in a store he developed a brief episode of a visual disturbance affecting his right side. This lasted several minutes. In addition when he got to work his right hand was clumsy and he had a difficult time putting his wallet in his back pocket and fastening his belt. His symptoms are resolved fairly quickly and he continued to work that day. The following day however, he developed a significant visual field cut on the left. He therefore had his [facility] and has undergone extensive workup. He states he still has persistent vision loss on the left like a film pulled over his eye.   At [facility], he had an MRA which suggested occlusion of the left internal carotid artery proximally. However later that night a duplex suggested that there might be some flow through the left internal carotid artery. Therefore, he is sent to [facility] for vascular consultation.  The patient denies any previous history of stroke, TIAs, expressive or receptive aphasia, or amaurosis fugax. He does have a history of atrial fibrillation as apparently had not been taking his Coumadin for a few days as his Coumadin was not therapeutic on admission.  Past Medical History  Diagnosis Date  . Hyperlipidemia   . Gilbert's syndrome   . Atrial fibrillation   . Vertigo   . Diabetes mellitus     Family History  Problem Relation Age of Onset  . Diabetes Mother   . Emphysema Father   . Heart failure Father   . Heart attack Brother   . Aneurysm Sister     SOCIAL HISTORY: History  Substance Use Topics  . Smoking status: Current  Everyday Smoker -- 0.0 packs/day for 10 years    Types: Cigars  . Smokeless tobacco: Never Used  . Alcohol Use: 1.8 oz/week    3 Cans of beer per week     beer occasionally    Allergies  Allergen Reactions  . Other     Mayonnaise  . Penicillins Rash    Current Facility-Administered Medications  Medication Dose Route Frequency Provider Last Rate Last Dose  . 0.9 %  sodium chloride infusion  250 mL Intravenous PRN Nishant Dhungel, MD      . acetaminophen (TYLENOL) tablet 650 mg  650 mg Oral Q4H PRN Vania Rea   650 mg at 08/18/11 0227  . acetaZOLAMIDE (DIAMOX) injection 500 mg  500 mg Intravenous Once Joya Gaskins, MD      . aspirin EC tablet 325 mg  325 mg Oral Daily Nishant Dhungel, MD   325 mg at 08/18/11 1045  . heparin ADULT infusion 100 units/ml (25000 units/250 ml)  1,700 Units/hr Intravenous Continuous Mady Gemma, PHARMD 17 mL/hr at 08/18/11 1046 1,700 Units/hr at 08/18/11 1046  . heparin bolus via infusion 3,000 Units  3,000 Units Intravenous Once Wayland Denis, PHARMD   3,000 Units at 08/17/11 1636  . insulin aspart (novoLOG) injection 0-15 Units  0-15 Units Subcutaneous TID WC Nishant Dhungel, MD      . isosorbide mononitrate (IMDUR) 24 hr tablet 30 mg  30 mg Oral Daily Nishant Dhungel, MD  30 mg at 08/18/11 1045  . metoprolol (TOPROL-XL) 24 hr tablet 50 mg  50 mg Oral BID Nishant Dhungel, MD   50 mg at 08/18/11 1045  . simvastatin (ZOCOR) tablet 40 mg  40 mg Oral Daily Nishant Dhungel, MD   40 mg at 08/18/11 1045  . sodium chloride 0.9 % injection 3 mL  3 mL Intravenous Q12H Nishant Dhungel, MD   3 mL at 08/18/11 1047  . sodium chloride 0.9 % injection 3 mL  3 mL Intravenous PRN Nishant Dhungel, MD   3 mL at 08/18/11 1047  . warfarin (COUMADIN) tablet 15 mg  15 mg Oral ONCE-1800 Scott A Hall, PHARMD   15 mg at 08/17/11 1832  . DISCONTD: heparin ADULT infusion 100 units/ml (25000 units/250 ml)  18 Units/kg/hr (Adjusted) Intravenous Continuous Wayland Denis,  PHARMD 17.5 mL/hr at 08/17/11 1834 17.821 Units/kg/hr at 08/17/11 1834  . DISCONTD: warfarin (COUMADIN) tablet 15 mg  15 mg Oral ONCE-1800 Mady Gemma, PHARMD        REVIEW OF SYSTEMS: Arly.Keller ] denotes positive finding; [  ] denotes negative finding CARDIOVASCULAR:  [ ]  chest pain   [ ]  chest pressure   [ ]  palpitations   [ ]  orthopnea   Arly.Keller ] dyspnea on exertion   [ ]  claudication   [ ]  rest pain   [ ]  DVT   [ ]  phlebitis PULMONARY:   [ ]  productive cough   [ ]  asthma   [ ]  wheezing NEUROLOGIC:   Arly.Keller ] weakness  [ ]  paresthesias  [ ]  aphasia  [X]  amaurosis  Arly.Keller ] dizziness HEMATOLOGIC:   [ ]  bleeding problems   [ ]  clotting disorders MUSCULOSKELETAL:  [ ]  joint pain   [ ]  joint swelling [ ]  leg swelling GASTROINTESTINAL: [ ]   blood in stool  [ ]   hematemesis GENITOURINARY:  [ ]   dysuria  [ ]   hematuria PSYCHIATRIC:  [ ]  history of major depression INTEGUMENTARY:  [ ]  rashes  [ ]  ulcers CONSTITUTIONAL:  [ ]  fever   [ ]  chills  PHYSICAL EXAM: Filed Vitals:   08/17/11 1725 08/17/11 2215 08/17/11 2250 08/18/11 0545  BP:  132/74 103/69 120/83  Pulse: 100 91 84 81  Temp:  97.2 F (36.2 C)  97.6 F (36.4 C)  TempSrc:      Resp:  20  20  Height:      Weight:      SpO2: 97% 92%  92%   Body mass index is 35.32 kg/(m^2). GENERAL: The patient is a well-nourished male, in no acute distress. The vital signs are documented above. CARDIOVASCULAR: There is a regular rate and rhythm without significant murmur appreciated. I do not detect any carotid bruits. He has palpable femoral popliteal and pedal pulses. He has no significant lower extremity swelling. PULMONARY: There is good air exchange bilaterally without wheezing or rales. ABDOMEN: Soft and non-tender with normal pitched bowel sounds.  MUSCULOSKELETAL: There are no major deformities or cyanosis. NEUROLOGIC: No focal weakness or paresthesias are detected. He does have a left visual field cut. SKIN: There are no ulcers or rashes  noted. PSYCHIATRIC: The patient has a normal affect.  DATA:  Lab Results  Component Value Date   WBC 8.0 08/18/2011   HGB 15.6 08/18/2011   HCT 45.9 08/18/2011   MCV 88.6 08/18/2011   PLT 204 08/18/2011   Lab Results  Component Value Date   NA 137 08/17/2011   K 4.0 08/17/2011  CL 106 08/17/2011   CO2 22 08/17/2011   Lab Results  Component Value Date   CREATININE 1.13 08/17/2011   Lab Results  Component Value Date   INR 1.08 08/18/2011   INR 0.96 08/17/2011   INR 1.3 11/25/2010   Lab Results  Component Value Date   HGBA1C 5.8* 08/18/2011   CBG (last 3)   Basename 08/18/11 1136 08/18/11 0717 08/17/11 2036  GLUCAP 94 94 83   CAROTID DUPLEX: Shows mild disease in the right internal carotid artery with no significant stenosis noted. Both vertebrals have normally directed antegrade flow. The left ICA appears to show a small amount of flow within the internal carotid artery. There is very little diastolic flow suggesting that this could be occluded distally.  MRA: Shows an abrupt occlusion of the proximal left internal carotid artery. There is no significant stenosis on the right side.  MEDICAL ISSUES: 1. Possible abrupt occlusion of the left internal carotid artery. The MRA and carotid duplex scans do not correlate. He has a history of atrial fibrillation and his Coumadin was not therapeutic so this is most likely an embolic event. Less likely, he has underlying occlusive disease and thrombosed his left internal carotid artery although I think this is unlikely. 2. The patient is currently on heparin and his Coumadin is being held pending a decision on whether or not he is a candidate for cerebral arteriography. I have ordered a carotid duplex scan of the left carotid artery to help determine if this is in fact occluded. If we are unable to demonstrate any flow in the left internal carotid artery then I think we can assume that the left ICA is occluded given the sensitivity of  duplex. If the artery is occluded then I would agree that this would simply be managed medically with Coumadin therapy. If the carotid duplex scan suggested there might be some flow in the left ICA and I would recommend a a cerebral arteriogram in order to rid her evaluate the left internal carotid artery. If there is flow in the left internal carotid artery, there is a small chance that he might be amenable to surgical intervention although I think this is unlikely. I will make further recommendations in the results of his carotid duplex scan.  Saylor Murry S [facility] [place] [facility]: [phone]  ADDENDUM: The patient underwent a left carotid duplex scan which I have reviewed. Does suggest some trickle flow through the left internal carotid artery. For this reason I have recommended we proceed with cerebral arteriography tomorrow. I have discussed the indications for cerebral arteriography. I've also discussed the potential complications of the procedure, including but not limited to: Bleeding, arterial injury, stroke (1%. Procedural risk), renal insufficiency, or other unpredictable medical problems. All the patient's questions were answered and they are agreeable to proceed. We will make further recommendations pending these results. As per my note of above, the arteries is occluded then the patient would simply be treated with anticoagulation. If there is flow through the carotid there is a small chance that this will be surgically accessible and treatable although I think the chances are small.  Di Kindle. Edilia Bo, MD, FACS [facility] [phone] 08/18/2011

## 2011-08-18 NOTE — Progress Notes (Signed)
ANTICOAGULATION CONSULT NOTE - Follow Up Consult  Pharmacy Consult for Heparin Indication: new onset stroke 2/2 L internal carotid artery occlusion  Allergies  Allergen Reactions  . Other     Mayonnaise  . Penicillins Rash    Patient Measurements: Height: 6\' 1"  (185.4 cm) Weight: 267 lb 11.2 oz (121.428 kg) IBW/kg (Calculated) : 79.9   Vital Signs: Temp: 98.2 F (36.8 C) (12/13 1608) Temp src: Oral (12/13 1608) BP: 125/75 mmHg (12/13 1608) Pulse Rate: 81  (12/13 1608)  Labs:  Basename 08/18/11 1720 08/18/11 0633 08/17/11 2145 08/17/11 1231  HGB -- 15.6 -- 14.6  HCT -- 45.9 -- 43.4  PLT -- 204 -- 199  APTT -- -- -- --  LABPROT -- 14.2 -- 13.0  INR -- 1.08 -- 0.96  HEPARINUNFRC 0.17* 0.60 0.28* --  CREATININE -- -- -- 1.13  CKTOTAL -- -- -- --  CKMB -- -- -- --  TROPONINI -- -- -- --   Estimated Creatinine Clearance: 100.8 ml/min (by C-G formula based on Cr of 1.13).    Assessment: 55 YOM transfered from [facility] on heparin infusion for afib (coumadin PTA) and new onset stroke 2/2 L internal carotid artery occlusion. coumadin on hold for vascular surgery eval. heparin level subtherapeutic, current rate 1700units/hr  Goal of Therapy:  Heparin level 0.3-0.5   Plan:  Increase heparin infusion to 1800 units/hr F/u heparin level 6 hours after rate change (at 0200 on 12/14)  Riki Rusk 08/18/2011,7:59 PM

## 2011-08-18 NOTE — Progress Notes (Signed)
Left carotid artery duplex completed at 16:30.  Preliminary report is trickle flow is noted throughout the left ICA. Smiley Houseman 08/18/2011, 4:31 PM

## 2011-08-18 NOTE — Progress Notes (Signed)
Utilization Review Completed.[name] T12/13/2012   

## 2011-08-18 NOTE — Progress Notes (Addendum)
Chart reviewed. Discussed with doctors Gerilyn Pilgrim, Dhungel, Laural Benes  Subjective: No new complaints. Vision in left eye unchanged. Can see light only.  Objective: Vital signs in last 24 hours: Filed Vitals:   08/17/11 1725 08/17/11 2215 08/17/11 2250 08/18/11 0545  BP:  132/74 103/69 120/83  Pulse: 100 91 84 81  Temp:  97.2 F (36.2 C)  97.6 F (36.4 C)  TempSrc:      Resp:  20  20  Height:      Weight:      SpO2: 97% 92%  92%   Weight change:   Intake/Output Summary (Last 24 hours) at 08/18/11 1202 Last data filed at 08/18/11 0800  Gross per 24 hour  Intake 616.06 ml  Output      0 ml  Net 616.06 ml   Physical Exam: Lungs clear to auscultation bilaterally without wheeze rhonchi or rales Cardiovascular irregularly irregular Abdomen soft nontender Extremities no clubbing cyanosis or edema Neurologic: Decreased visual acuity left eye, otherwise nonfocal  Lab Results: Basic Metabolic Panel:  Lab 08/17/11 7829  NA 137  K 4.0  CL 106  CO2 22  GLUCOSE 94  BUN 18  CREATININE 1.13  CALCIUM 9.8  MG --  PHOS --   Liver Function Tests: No results found for this basename: AST:2,ALT:2,ALKPHOS:2,BILITOT:2,PROT:2,ALBUMIN:2 in the last 168 hours No results found for this basename: LIPASE:2,AMYLASE:2 in the last 168 hours No results found for this basename: AMMONIA:2 in the last 168 hours CBC:  Lab 08/18/11 0633 08/17/11 1231  WBC 8.0 7.6  NEUTROABS -- 4.8  HGB 15.6 14.6  HCT 45.9 43.4  MCV 88.6 90.0  PLT 204 199   Cardiac Enzymes: No results found for this basename: CKTOTAL:3,CKMB:3,CKMBINDEX:3,TROPONINI:3 in the last 168 hours BNP: No components found with this basename: POCBNP:3 D-Dimer: No results found for this basename: DDIMER:2 in the last 168 hours CBG:  Lab 08/18/11 1136 08/18/11 0717 08/17/11 2036 08/17/11 1735  GLUCAP 94 94 83 88   Hemoglobin A1C: No results found for this basename: HGBA1C in the last 168 hours Fasting Lipid Panel:  Lab  08/18/11 0641  CHOL 193  HDL 41  LDLCALC 136*  TRIG 80  CHOLHDL 4.7  LDLDIRECT --   Thyroid Function Tests: No results found for this basename: TSH,T4TOTAL,FREET4,T3FREE,THYROIDAB in the last 168 hours Coagulation:  Lab 08/18/11 0633 08/17/11 1231  LABPROT 14.2 13.0  INR 1.08 0.96   Micro Results: No results found for this or any previous visit (from the past 240 hour(s)). Studies/Results: Ct Head Wo Contrast  08/17/2011  *RADIOLOGY REPORT*  Clinical Data: Visual field changes in the left thigh.  CT HEAD WITHOUT CONTRAST  Technique:  Contiguous axial images were obtained from the base of the skull through the vertex without contrast.  Comparison: 04/21/2010  Findings: No acute intracranial abnormality.  Specifically, no hemorrhage, hydrocephalus, mass lesion, acute infarction, or significant intracranial injury.  No acute calvarial abnormality.  Short air fluid levels are seen in the maxillary sinuses bilaterally, a nonspecific finding.  This may reflect acute sinusitis.  IMPRESSION: No acute intracranial abnormality.  Small air-fluid levels in the maxillary sinuses bilaterally.  Original Report Authenticated By: Cyndie Chime, M.D.   Mr Angiogram Head Wo Contrast  08/17/2011  *RADIOLOGY REPORT*  Clinical Data:  55 year old male with bilateral visual loss times 2 days.  Bilateral hand numbness.  Contrast: 20mL MULTIHANCE GADOBENATE DIMEGLUMINE 529 MG/ML IV SOLN  Comparison: Head CTs 08/17/2011, 04/21/2010.  MRI HEAD WITHOUT AND WITH CONTRAST  Technique: Multiplanar, multiecho pulse sequences of the brain and surrounding structures were obtained according to standard protocol without and with intravenous contrast.  Findings:  Study is intermittently degraded by motion artifact despite repeated imaging attempts.  There is a smaller cortically based restricted diffusion in the left occipital lobe, cephalad to the calcarine fissure.  There is associated T2 and FLAIR hyperintensity.  No mass  effect or hemorrhage.  Major intracranial vascular flow voids absent flow void in the left ICA siphon and visualized distal left cervical ICA.  See MRA findings below.  Other major intracranial vascular flow voids are preserved.   No abnormal enhancement identified.  No ventriculomegaly. No acute intracranial hemorrhage identified.  No intracranial mass lesion. Negative visualized cervical spine.  Normal bone marrow signal. Negative pituitary. Visualized orbit soft tissues are within normal limits.  Small fluid levels in both maxillary sinuses.  Mastoids are clear.  Negative scalp soft tissues.  IMPRESSION: 1.  Small acute left PCA infarct involving the left occipital lobe/cuneus.  No mass effect or hemorrhage. 2.  Left ICA occlusion or slow flow, likely chronic in the absence of left anterior circulation findings. MRA findings are below.  MRA NECK WITHOUT AND WITH CONTRAST  Technique:  Angiographic images of the neck were obtained using MRA technique without and with intravenous contrast.  Carotid stenosis measurements (when applicable) are obtained utilizing NASCET criteria, using the distal internal carotid diameter as the denominator.  Findings:  Precontrast time-of-flight images reveal antegrade flow in both common carotid and vertebral arteries.  There is no antegrade flow signal evident in the cervical left ICA.  Antegrade signal continues in the right ICA and both vertebral arteries.  Following contrast, bovine arch configuration is noted.  Common carotid artery origins are within normal limits.  The right common carotid artery is within normal limits.  The right carotid bifurcation is mildly irregular.  Filling defect in the posterior right ICA origin and bulb is compatible with atherosclerotic plaque.  Subsequent stenosis is less than 50 % with respect to the distal vessel.  The cervical right ICA otherwise is normal.  Mildly tortuous left common carotid artery which otherwise appears within normal limits.   There is little irregularity at the left carotid bifurcation.  The left ICA origin appears patent but abruptly occludes 9 mm later at the level of the distal bulb (series 50 image 6).  Left ECA remains patent.  Both vertebral artery origins are within normal limits, the right is less well visualized.  The vertebral arteries are codominant and within normal limits throughout the neck.  IMPRESSION: 1.  Abrupt occlusion of the left ICA just beyond its origin. 2.  Mild atherosclerosis at the right carotid bifurcation without significant right ICA stenosis. 3.  Negative cervical vertebral arteries. 4.  Intracranial findings are below.  MRA HEAD WITHOUT CONTRAST  Technique: Angiographic images of the Circle of Willis were obtained using MRA technique without  intravenous contrast.  Findings:  Antegrade flow in the posterior circulation with codominant distal vertebral arteries.  The right PICA is patent. There is a dominant appearing left AICA.  The distal left vertebral artery is within normal limits.  There is moderate to severe irregularity at the right vertebrobasilar junction and continuing into the proximal and mid basilar artery. These time-of-flight images suggest mild to moderate basilar stenosis, but the same basilar artery segment visualized post contrast on the above MRA appears without significant stenosis. The distal basilar is patent and within normal limits.  Superior cerebellar  arteries and PCA origins are within normal limits. There is moderate irregularity of the bilateral PCA branches. Stenosis appears worst in the right P2 segment.  Distal PCA flow appears relatively symmetric.  Both posterior communicating arteries are present, the right is diminutive.  No antegrade flow in the left ICA siphon.  Flow at the left ICA terminus is reconstituted probably from the left posterior communicating artery.  Antegrade flow in the right ICA siphon.  Cavernous segment irregularity without right ICA stenosis.   Patent right ICA terminus.  Decreased left ACA A1 segment flow.  Normal right ACA.  Beyond the anterior communicating artery ACA branch flow appears symmetric. Normal visualized right MCA branches.  Mildly attenuated flow in the left MCA M1 segment.  Mild to moderately attenuated left MCA branch flow compared the right, more so in the anterior sylvian division.  IMPRESSION: 1.  Occluded ICA siphon.  Reconstituted flow at the left ICA terminus probably from the posterior communicating artery.  Mildly attenuated flow in the left MCA territory. 2.  Atherosclerosis and stenosis at the right vertebrobasilar junction, but these intracranial MRA images likely overestimate the degree of proximal basilar stenosis - the post contrast images of the basilar artery on the neck MRA portion do not indicate any proximal basilar stenosis.  Original Report Authenticated By: Harley Hallmark, M.D.   Mr Angiogram Neck W Wo Contrast  08/17/2011  *RADIOLOGY REPORT*  Clinical Data:  55 year old male with bilateral visual loss times 2 days.  Bilateral hand numbness.  Contrast: 20mL MULTIHANCE GADOBENATE DIMEGLUMINE 529 MG/ML IV SOLN  Comparison: Head CTs 08/17/2011, 04/21/2010.  MRI HEAD WITHOUT AND WITH CONTRAST  Technique: Multiplanar, multiecho pulse sequences of the brain and surrounding structures were obtained according to standard protocol without and with intravenous contrast.  Findings:  Study is intermittently degraded by motion artifact despite repeated imaging attempts.  There is a smaller cortically based restricted diffusion in the left occipital lobe, cephalad to the calcarine fissure.  There is associated T2 and FLAIR hyperintensity.  No mass effect or hemorrhage.  Major intracranial vascular flow voids absent flow void in the left ICA siphon and visualized distal left cervical ICA.  See MRA findings below.  Other major intracranial vascular flow voids are preserved.   No abnormal enhancement identified.  No  ventriculomegaly. No acute intracranial hemorrhage identified.  No intracranial mass lesion. Negative visualized cervical spine.  Normal bone marrow signal. Negative pituitary. Visualized orbit soft tissues are within normal limits.  Small fluid levels in both maxillary sinuses.  Mastoids are clear.  Negative scalp soft tissues.  IMPRESSION: 1.  Small acute left PCA infarct involving the left occipital lobe/cuneus.  No mass effect or hemorrhage. 2.  Left ICA occlusion or slow flow, likely chronic in the absence of left anterior circulation findings. MRA findings are below.  MRA NECK WITHOUT AND WITH CONTRAST  Technique:  Angiographic images of the neck were obtained using MRA technique without and with intravenous contrast.  Carotid stenosis measurements (when applicable) are obtained utilizing NASCET criteria, using the distal internal carotid diameter as the denominator.  Findings:  Precontrast time-of-flight images reveal antegrade flow in both common carotid and vertebral arteries.  There is no antegrade flow signal evident in the cervical left ICA.  Antegrade signal continues in the right ICA and both vertebral arteries.  Following contrast, bovine arch configuration is noted.  Common carotid artery origins are within normal limits.  The right common carotid artery is within normal limits.  The right  carotid bifurcation is mildly irregular.  Filling defect in the posterior right ICA origin and bulb is compatible with atherosclerotic plaque.  Subsequent stenosis is less than 50 % with respect to the distal vessel.  The cervical right ICA otherwise is normal.  Mildly tortuous left common carotid artery which otherwise appears within normal limits.  There is little irregularity at the left carotid bifurcation.  The left ICA origin appears patent but abruptly occludes 9 mm later at the level of the distal bulb (series 50 image 6).  Left ECA remains patent.  Both vertebral artery origins are within normal limits,  the right is less well visualized.  The vertebral arteries are codominant and within normal limits throughout the neck.  IMPRESSION: 1.  Abrupt occlusion of the left ICA just beyond its origin. 2.  Mild atherosclerosis at the right carotid bifurcation without significant right ICA stenosis. 3.  Negative cervical vertebral arteries. 4.  Intracranial findings are below.  MRA HEAD WITHOUT CONTRAST  Technique: Angiographic images of the Circle of Willis were obtained using MRA technique without  intravenous contrast.  Findings:  Antegrade flow in the posterior circulation with codominant distal vertebral arteries.  The right PICA is patent. There is a dominant appearing left AICA.  The distal left vertebral artery is within normal limits.  There is moderate to severe irregularity at the right vertebrobasilar junction and continuing into the proximal and mid basilar artery. These time-of-flight images suggest mild to moderate basilar stenosis, but the same basilar artery segment visualized post contrast on the above MRA appears without significant stenosis. The distal basilar is patent and within normal limits.  Superior cerebellar arteries and PCA origins are within normal limits. There is moderate irregularity of the bilateral PCA branches. Stenosis appears worst in the right P2 segment.  Distal PCA flow appears relatively symmetric.  Both posterior communicating arteries are present, the right is diminutive.  No antegrade flow in the left ICA siphon.  Flow at the left ICA terminus is reconstituted probably from the left posterior communicating artery.  Antegrade flow in the right ICA siphon.  Cavernous segment irregularity without right ICA stenosis.  Patent right ICA terminus.  Decreased left ACA A1 segment flow.  Normal right ACA.  Beyond the anterior communicating artery ACA branch flow appears symmetric. Normal visualized right MCA branches.  Mildly attenuated flow in the left MCA M1 segment.  Mild to moderately  attenuated left MCA branch flow compared the right, more so in the anterior sylvian division.  IMPRESSION: 1.  Occluded ICA siphon.  Reconstituted flow at the left ICA terminus probably from the posterior communicating artery.  Mildly attenuated flow in the left MCA territory. 2.  Atherosclerosis and stenosis at the right vertebrobasilar junction, but these intracranial MRA images likely overestimate the degree of proximal basilar stenosis - the post contrast images of the basilar artery on the neck MRA portion do not indicate any proximal basilar stenosis.  Original Report Authenticated By: Harley Hallmark, M.D.   Mr Laqueta Jean Wo Contrast  08/17/2011  *RADIOLOGY REPORT*  Clinical Data:  55 year old male with bilateral visual loss times 2 days.  Bilateral hand numbness.  Contrast: 20mL MULTIHANCE GADOBENATE DIMEGLUMINE 529 MG/ML IV SOLN  Comparison: Head CTs 08/17/2011, 04/21/2010.  MRI HEAD WITHOUT AND WITH CONTRAST  Technique: Multiplanar, multiecho pulse sequences of the brain and surrounding structures were obtained according to standard protocol without and with intravenous contrast.  Findings:  Study is intermittently degraded by motion artifact despite repeated imaging  attempts.  There is a smaller cortically based restricted diffusion in the left occipital lobe, cephalad to the calcarine fissure.  There is associated T2 and FLAIR hyperintensity.  No mass effect or hemorrhage.  Major intracranial vascular flow voids absent flow void in the left ICA siphon and visualized distal left cervical ICA.  See MRA findings below.  Other major intracranial vascular flow voids are preserved.   No abnormal enhancement identified.  No ventriculomegaly. No acute intracranial hemorrhage identified.  No intracranial mass lesion. Negative visualized cervical spine.  Normal bone marrow signal. Negative pituitary. Visualized orbit soft tissues are within normal limits.  Small fluid levels in both maxillary sinuses.  Mastoids  are clear.  Negative scalp soft tissues.  IMPRESSION: 1.  Small acute left PCA infarct involving the left occipital lobe/cuneus.  No mass effect or hemorrhage. 2.  Left ICA occlusion or slow flow, likely chronic in the absence of left anterior circulation findings. MRA findings are below.  MRA NECK WITHOUT AND WITH CONTRAST  Technique:  Angiographic images of the neck were obtained using MRA technique without and with intravenous contrast.  Carotid stenosis measurements (when applicable) are obtained utilizing NASCET criteria, using the distal internal carotid diameter as the denominator.  Findings:  Precontrast time-of-flight images reveal antegrade flow in both common carotid and vertebral arteries.  There is no antegrade flow signal evident in the cervical left ICA.  Antegrade signal continues in the right ICA and both vertebral arteries.  Following contrast, bovine arch configuration is noted.  Common carotid artery origins are within normal limits.  The right common carotid artery is within normal limits.  The right carotid bifurcation is mildly irregular.  Filling defect in the posterior right ICA origin and bulb is compatible with atherosclerotic plaque.  Subsequent stenosis is less than 50 % with respect to the distal vessel.  The cervical right ICA otherwise is normal.  Mildly tortuous left common carotid artery which otherwise appears within normal limits.  There is little irregularity at the left carotid bifurcation.  The left ICA origin appears patent but abruptly occludes 9 mm later at the level of the distal bulb (series 50 image 6).  Left ECA remains patent.  Both vertebral artery origins are within normal limits, the right is less well visualized.  The vertebral arteries are codominant and within normal limits throughout the neck.  IMPRESSION: 1.  Abrupt occlusion of the left ICA just beyond its origin. 2.  Mild atherosclerosis at the right carotid bifurcation without significant right ICA stenosis.  3.  Negative cervical vertebral arteries. 4.  Intracranial findings are below.  MRA HEAD WITHOUT CONTRAST  Technique: Angiographic images of the Circle of Willis were obtained using MRA technique without  intravenous contrast.  Findings:  Antegrade flow in the posterior circulation with codominant distal vertebral arteries.  The right PICA is patent. There is a dominant appearing left AICA.  The distal left vertebral artery is within normal limits.  There is moderate to severe irregularity at the right vertebrobasilar junction and continuing into the proximal and mid basilar artery. These time-of-flight images suggest mild to moderate basilar stenosis, but the same basilar artery segment visualized post contrast on the above MRA appears without significant stenosis. The distal basilar is patent and within normal limits.  Superior cerebellar arteries and PCA origins are within normal limits. There is moderate irregularity of the bilateral PCA branches. Stenosis appears worst in the right P2 segment.  Distal PCA flow appears relatively symmetric.  Both posterior  communicating arteries are present, the right is diminutive.  No antegrade flow in the left ICA siphon.  Flow at the left ICA terminus is reconstituted probably from the left posterior communicating artery.  Antegrade flow in the right ICA siphon.  Cavernous segment irregularity without right ICA stenosis.  Patent right ICA terminus.  Decreased left ACA A1 segment flow.  Normal right ACA.  Beyond the anterior communicating artery ACA branch flow appears symmetric. Normal visualized right MCA branches.  Mildly attenuated flow in the left MCA M1 segment.  Mild to moderately attenuated left MCA branch flow compared the right, more so in the anterior sylvian division.  IMPRESSION: 1.  Occluded ICA siphon.  Reconstituted flow at the left ICA terminus probably from the posterior communicating artery.  Mildly attenuated flow in the left MCA territory. 2.   Atherosclerosis and stenosis at the right vertebrobasilar junction, but these intracranial MRA images likely overestimate the degree of proximal basilar stenosis - the post contrast images of the basilar artery on the neck MRA portion do not indicate any proximal basilar stenosis.  Original Report Authenticated By: Harley Hallmark, M.D.   US Carotid Duplex Bilateral  08/18/2011  *RADIOLOGY REPORT*  Clinical Data: Left PCA infarct.  BILATERAL CAROTID DUPLEX ULTRASOUND  Technique: Wallace Cullens scale imaging, color Doppler and duplex ultrasound was performed of bilateral carotid and vertebral arteries in the neck.  Comparison:  None  Criteria:  Quantification of carotid stenosis is based on velocity parameters that correlate the residual internal carotid diameter with NASCET-based stenosis levels, using the diameter of the distal internal carotid lumen as the denominator for stenosis measurement.  The following velocity measurements were obtained:                   PEAK SYSTOLIC/END DIASTOLIC RIGHT ICA:                        107/49cm/sec CCA:                        107/22cm/sec SYSTOLIC ICA/CCA RATIO:     1.0 DIASTOLIC ICA/CCA RATIO:    2.28 ECA:                        132cm/sec  LEFT ICA:                        18/2cm/sec CCA:                        91/9cm/sec SYSTOLIC ICA/CCA RATIO:     0.19 DIASTOLIC ICA/CCA RATIO:    0.19 ECA:                        58cm/sec  Findings:  RIGHT CAROTID ARTERY: Moderate soft plaque throughout the distal common carotid artery, carotid bulb, ICA and ECA without flow- limiting stenosis, less than 50%.  RIGHT VERTEBRAL ARTERY:  Antegrade flow.  LEFT CAROTID ARTERY: Extensive plaque formation in the carotid bulb and ICA.  Subtotal occlusion of the left ICA with only a slow trickle of flow.  LEFT VERTEBRAL ARTERY:  Antegrade flow.  IMPRESSION: Critical/subtotal occlusion of the left ICA.  Moderate disease in the right ICA without flow limiting stenosis.  Original Report Authenticated By: Cyndie Chime, M.D.   Scheduled Meds:   . acetaZOLAMIDE  500 mg Intravenous Once  .  aspirin EC  325 mg Oral Daily  . heparin  3,000 Units Intravenous Once  . insulin aspart  0-15 Units Subcutaneous TID WC  . isosorbide mononitrate  30 mg Oral Daily  . metoprolol  50 mg Oral BID  . simvastatin  40 mg Oral Daily  . sodium chloride  3 mL Intravenous Q12H  . tetracaine  1 drop Both Eyes Once  . warfarin  15 mg Oral ONCE-1800  . warfarin  15 mg Oral ONCE-1800   Continuous Infusions:   . heparin 1,700 Units/hr (08/18/11 1046)  . DISCONTD: heparin 17.821 Units/kg/hr (08/17/11 1834)   PRN Meds:.sodium chloride, acetaminophen, gadobenate dimeglumine, sodium chloride Assessment/Plan: Principal Problem:  *CVA (cerebral infarction) Active Problems:  Carotid stenosis, left  DIAB W/O COMP TYPE II/UNS NOT STATED UNCNTRL  Atrial fibrillation  DYSLIPIDEMIA  Dr. Gerilyn Pilgrim recommends transfer to [facility] for vascular surgery evaluation. The carotid duplex shows critical stenosis with subtotal occlusion, (not occlusion, as MRA resulted). I spoke with Dr. Cari Caraway who will consult once patient is transferred. He reports that the patient will most likely need angiogram. Patient is agreeable to transfer. Hold Coumadin until vascular surgery evaluation. Spoke with Dr. Jerral Ralph who has kindly accepted the patient in transfer. Attending physician will be Dr. Lavera Guise.   LOS: 1 day   Keshon Markovitz L 08/18/2011, 12:02 PM

## 2011-08-19 ENCOUNTER — Ambulatory Visit (HOSPITAL_COMMUNITY): Admit: 2011-08-19 | Payer: Self-pay | Admitting: Vascular Surgery

## 2011-08-19 ENCOUNTER — Encounter (HOSPITAL_COMMUNITY)
Admission: EM | Disposition: A | Discharge status: Home / Self Care | Payer: Self-pay | Source: Home / Self Care | Attending: Internal Medicine

## 2011-08-19 DIAGNOSIS — I2510 Atherosclerotic heart disease of native coronary artery without angina pectoris: Secondary | ICD-10-CM

## 2011-08-19 HISTORY — PX: CEREBRAL ANGIOGRAM: SHX5506

## 2011-08-19 HISTORY — DX: Atherosclerotic heart disease of native coronary artery without angina pectoris: I25.10

## 2011-08-19 HISTORY — PX: CAROTID ANGIOGRAM: SHX5504

## 2011-08-19 LAB — CBC
HCT: 44 % (ref 39.0–52.0)
Hemoglobin: 15.3 g/dL (ref 13.0–17.0)
MCHC: 34.8 g/dL (ref 30.0–36.0)
MCV: 89.1 fL (ref 78.0–100.0)

## 2011-08-19 SURGERY — CEREBRAL ANGIOGRAM
Anesthesia: LOCAL

## 2011-08-19 MED ORDER — HEPARIN SOD (PORCINE) IN D5W 100 UNIT/ML IV SOLN: 1800.0000 [IU]/h | INTRAVENOUS | Status: DC

## 2011-08-19 MED ORDER — WARFARIN SODIUM 10 MG PO TABS
10.0000 mg | ORAL_TABLET | Freq: Once | ORAL | Status: AC
  Filled 2011-08-19: qty 1

## 2011-08-19 MED ORDER — HEPARIN SOD (PORCINE) IN D5W 100 UNIT/ML IV SOLN
1650.0000 [IU]/h | INTRAVENOUS | Status: DC
  Administered 2011-08-19 – 2011-08-22 (×3): 1800 [IU]/h via INTRAVENOUS
  Filled 2011-08-19 (×3): qty 250

## 2011-08-19 MED ORDER — LABETALOL HCL 5 MG/ML IV SOLN
INTRAVENOUS | Status: AC
  Filled 2011-08-19: qty 4

## 2011-08-19 MED ORDER — HEPARIN (PORCINE) IN NACL 2-0.9 UNIT/ML-% IJ SOLN
INTRAMUSCULAR | Status: AC
  Filled 2011-08-19: qty 1000

## 2011-08-19 MED ORDER — LIDOCAINE HCL (PF) 1 % IJ SOLN
INTRAMUSCULAR | Status: AC
  Filled 2011-08-19: qty 30

## 2011-08-19 NOTE — Progress Notes (Addendum)
VASCULAR PROGRESS NOTE  SUBJECTIVE: No specific complaints  PHYSICAL EXAM: Filed Vitals:   08/18/11 1608 08/18/11 2100 08/19/11 0130 08/19/11 0500  BP: 125/75 114/69 131/96 120/75  Pulse: 81 54 70 84  Temp: 98.2 F (36.8 C) 98 F (36.7 C) 98 F (36.7 C) 97.7 F (36.5 C)  TempSrc: Oral Oral Oral Oral  Resp: 17 24 18 20   Height:      Weight:      SpO2: 97% 99% 98% 97%   Lungs: clear to auscultation Neuro: Good strength in upper extremities and lower extremities bilaterally  LABS: Lab Results  Component Value Date   WBC 9.0 08/19/2011   HGB 15.3 08/19/2011   HCT 44.0 08/19/2011   MCV 89.1 08/19/2011   PLT 217 08/19/2011   Lab Results  Component Value Date   CREATININE 1.13 08/17/2011   Lab Results  Component Value Date   INR 1.08 08/18/2011   CBG (last 3)   Basename 08/19/11 1140 08/19/11 0733 08/18/11 2052  GLUCAP 91 95 102*     ASSESSMENT/PLAN: 1. For cerebral arteriogram today to determine if left internal carotid artery is patent.   Waverly Ferrari, MD, FACS Beeper: [phone] 08/19/2011  ADDENDUM: Cerebral arteriogram today demonstrates that the left internal carotid artery is occluded. Thus, there are no surgical options. I would agree with medical management including Coumadin. Vascular surgery we will be available as needed.  Di Kindle. Edilia Bo, MD, FACS Beeper [phone] 08/19/2011

## 2011-08-19 NOTE — Progress Notes (Signed)
ANTICOAGULATION CONSULT NOTE - Follow Up Consult  Pharmacy Consult for Heparin Indication: new onset stroke 2/2 L internal carotid artery occlusion  Allergies  Allergen Reactions  . Other     Mayonnaise  . Penicillins Rash    Patient Measurements: Height: 6\' 1"  (185.4 cm) Weight: 267 lb 11.2 oz (121.428 kg) IBW/kg (Calculated) : 79.9   Vital Signs: Temp: 98 F (36.7 C) (12/13 2100) Temp src: Oral (12/13 2100) BP: 114/69 mmHg (12/13 2100) Pulse Rate: 54  (12/13 2100)  Labs:  Basename 08/19/11 0123 08/18/11 1720 08/18/11 0633 08/17/11 1231  HGB 15.3 -- 15.6 --  HCT 44.0 -- 45.9 43.4  PLT 217 -- 204 199  APTT -- -- -- --  LABPROT -- -- 14.2 13.0  INR -- -- 1.08 0.96  HEPARINUNFRC 0.44 0.17* 0.60 --  CREATININE -- -- -- 1.13  CKTOTAL -- -- -- --  CKMB -- -- -- --  TROPONINI -- -- -- --   Estimated Creatinine Clearance: 100.8 ml/min (by C-G formula based on Cr of 1.13).    Assessment: 55 yo male with h/o Afib, new onset stroke due to L internal carotid artery occlusion, awaiting angiogram, for Heparin.  Goal of Therapy:  Heparin level 0.3-0.5   Plan:  Continue Heparin at current rate.  F/U after procedure.   Eddie Candle 08/19/2011,4:13 AM

## 2011-08-19 NOTE — Progress Notes (Addendum)
Physical Therapy Evaluation  Recommend OT eval for visual deficits. Patient Details Name: [name] MRN: [id number] DOB: Jun 19, 1956 Today's Date: 08/19/2011  Problem List:  Patient Active Problem List  Diagnoses  . DIAB W/O COMP TYPE II/UNS NOT STATED UNCNTRL  . DYSLIPIDEMIA  . Atrial fibrillation  . Carotid stenosis, left  . CVA (cerebral infarction)    Past Medical History:  Past Medical History  Diagnosis Date  . Hyperlipidemia   . Gilbert's syndrome   . Vertigo   . Diabetes mellitus     diet controlled  . Angina   . Hypertension   . Atrial fibrillation   . Atrial flutter 2007    h/o  . Stroke ~08/17/11    "lost vision left eye"  . Blood dyscrasia    Past Surgical History:  Past Surgical History  Procedure Date  . Achilles tendon repair 06/2002    Left  . Cardioversion 2007    PT Assessment/Plan/Recommendation PT Assessment Clinical Impression Statement: Pt indepedent with mobility without difficulty. 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 Arousal/Alertness: Awake/alert Overall Cognitive Status: Appears within functional limits for tasks assessed Orientation Level: Oriented X4 Sensation/Coordination   Extremity Assessment RLE Assessment RLE Assessment: Within Functional Limits Mobility (including Balance) Transfers Sit to Stand: 7: Independent Stand to Sit: 7: Independent Ambulation/Gait Ambulation/Gait: Yes Ambulation/Gait Assistance: 7:  Independent Ambulation Distance (Feet): 300 Feet Gait Pattern: Within Functional Limits Gait velocity: 3.08 ft/sec  Dynamic Gait Index Level Surface: Normal Change in Gait Speed: Normal Gait with Horizontal Head Turns: Normal Gait with Vertical Head Turns: Mild Impairment Gait and Pivot Turn: Normal Exercise    End of Session PT - End of Session Activity Tolerance: Patient tolerated treatment well Patient left: in chair General Behavior During Session: [facility] for tasks performed Cognition: [facility] for tasks performed  [facility] 08/19/2011, 11:21 AM Maneh Sieben PT [phone]

## 2011-08-19 NOTE — Progress Notes (Signed)
ANTICOAGULATION CONSULT NOTE - Initial Consult  Pharmacy Consult for Coumadin Indication: atrial fibrillation/new onset stroke 2/2 L internal carotid artery occlusion   Allergies  Allergen Reactions  . Other     Mayonnaise  . Penicillins Rash    Patient Measurements: Height: 6\' 1"  (185.4 cm) Weight: 267 lb 11.2 oz (121.428 kg) IBW/kg (Calculated) : 79.9   Vital Signs: Temp: 98.3 F (36.8 C) (12/14 1500) Temp src: Oral (12/14 1500) BP: 129/90 mmHg (12/14 1500) Pulse Rate: 86  (12/14 1500)  Labs:  Basename 08/19/11 0123 08/18/11 1720 08/18/11 0633 08/17/11 1231  HGB 15.3 -- 15.6 --  HCT 44.0 -- 45.9 43.4  PLT 217 -- 204 199  APTT -- -- -- --  LABPROT -- -- 14.2 13.0  INR -- -- 1.08 0.96  HEPARINUNFRC 0.44 0.17* 0.60 --  CREATININE -- -- -- 1.13  CKTOTAL -- -- -- --  CKMB -- -- -- --  TROPONINI -- -- -- --   Estimated Creatinine Clearance: 100.8 ml/min (by C-G formula based on Cr of 1.13).  Medical History: Past Medical History  Diagnosis Date  . Hyperlipidemia   . Gilbert's syndrome   . Vertigo   . Diabetes mellitus     diet controlled  . Angina   . Hypertension   . Atrial fibrillation   . Atrial flutter 2007    h/o  . Stroke ~08/17/11    "lost vision left eye"  . Blood dyscrasia     Assessment: 55yom with h/o Afib and new onset stroke secondary to occluded internal carotid artery verified by arteriogram today. Plan to resume heparin (no bolus) @ 1900 tonight and also to restart coumadin tonight. Pt. Home dose was 15mg  daily per [facility].   Goal of Therapy:  INR 2-3   Plan:  Coumadin 10mg  PO x 1 tonight F/u INR in am  Riki Rusk 08/19/2011,6:08 PM

## 2011-08-19 NOTE — Progress Notes (Signed)
ANTICOAGULATION CONSULT NOTE - Follow Up Consult  Pharmacy Consult for Heparin Indication: new onset stroke 2/2 L internal carotid artery occlusion   Allergies  Allergen Reactions  . Other     Mayonnaise  . Penicillins Rash    Patient Measurements: Height: 6\' 1"  (185.4 cm) Weight: 267 lb 11.2 oz (121.428 kg) IBW/kg (Calculated) : 79.9  Adjusted Body Weight:   Vital Signs: Temp: 98.3 F (36.8 C) (12/14 1500) Temp src: Oral (12/14 1500) BP: 129/90 mmHg (12/14 1500) Pulse Rate: 86  (12/14 1500)  Labs:  Basename 08/19/11 0123 08/18/11 1720 08/18/11 0633 08/17/11 1231  HGB 15.3 -- 15.6 --  HCT 44.0 -- 45.9 43.4  PLT 217 -- 204 199  APTT -- -- -- --  LABPROT -- -- 14.2 13.0  INR -- -- 1.08 0.96  HEPARINUNFRC 0.44 0.17* 0.60 --  CREATININE -- -- -- 1.13  CKTOTAL -- -- -- --  CKMB -- -- -- --  TROPONINI -- -- -- --   Estimated Creatinine Clearance: 100.8 ml/min (by C-G formula based on Cr of 1.13).   Assessment: 55yom with h/o Afib and new onset stroke secondary to occluded internal carotid artery verified by arteriogram today. Heparin was turned off at 0800 this AM for arteriogram. Per Dr. Edilia Bo post-procedure heparin order, OK to resume heparin (no bolus) @ 1900 tonight and resume Coumadin tomorrow. Heparin level (0.44) was therapeutic this AM with rate of 1800 units/hr. - H/H and Plts stable - No significant bleeding complications reported  Goal of Therapy:  Heparin level 0.3-0.5   Plan:  1. Resume heparin drip 1800 units/hr (86ml/hr) @ 1900 tonight per MD order 2. Check heparin level 6hrs after heparin resumed 3. Restart Coumadin tomorrow 4. Continue daily heparin levels and PT/INR  Cleon Dew [phone] 08/19/2011,4:00 PM

## 2011-08-19 NOTE — Op Note (Signed)
08/19/2011  PREOP DIAGNOSIS: Possible occluded left internal carotid artery.  POSTOP DIAGNOSIS: Occluded left internal carotid artery.  PROCEDURE:  1. Ultrasound-guided access to right common femoral artery 2. Arch aortogram 3. Selective catheterization of the innominate artery with innominate arteriogram 4. Selective catheterization of right common carotid artery with right carotid arteriogram 5. Selective catheterization of left common carotid artery with left carotid arteriogram 6. Retrograde right femoral arteriogram 7. Perclose right common femoral artery  SURGEON: Di Kindle. Edilia Bo, MD, FACS  ANESTHESIA: local   EBL: minimal  INDICATIONS: This is a 55 year old gentleman who developed acute onset of visual field cut on the left and was found to have a occluded left internal carotid artery by MRA. However, carotid duplex scan suggested that the carotid was  Patent with trickle flow. A repeat carotid duplex in our institution also suggested trickle flow and the left internal carotid artery. I felt that carotid arteriogram was indicated to determine if the left internal carotid artery was patent and if there were any surgical options for addressing the disease in the left internal carotid artery.  TECHNIQUE: The patient was brought to the PV lab in both groins were prepped and draped in the usual sterile fashion. Patient did not receive sedation. After the skin was anesthetized and under ultrasound guidance, the right common femoral artery was cannulated and a guidewire introduced into the infrarenal aorta under fluoroscopic control. A 5 French sheath was introduced over the wire. A long pigtail catheter was positioned in the ascending aortic arch and arch aortogram obtained at a 40 LAO projection. I then exchanged the pigtail catheter for an H1 catheter which was positioned in the innominate artery and the innominate arteriogram was obtained a 30 RAO projection. The wire was then  advanced into the right common carotid artery and then the catheter advanced over the wire. Selective right common carotid arteriogram obtained with intracranial and extracranial views. Intracranial interpretation will be dictated separately by the neuroradiologist. Next I positioned the H1 catheter in the proximal left common carotid artery. The patient has a bovine arch. I was unable to advance the wire enough to get the catheter up into the common carotid artery ultimately I used a similar 1 catheter which I was able to position into the common carotid artery and engaged the artery. Selective left common carotid arteriogram was obtained. Again both intracranial and extracranial views were obtained. Next the catheter was removed over a wire and a retrograde right femoral arteriogram obtained in order to determine if the patient was a candidate for Perclose. Patient appeared to be a good candidate and the sheath was exchanged for the Perclose device which was used for femoral artery closure without complications. No immediate complications were noted.  FINDINGS:  1. The patient has a bovine arch 2. There is no significant disease in the aortic arch, right subclavian artery, right vertebral artery, right common carotid artery, left common carotid artery, left subclavian artery, and left vertebral artery. 3. There is no significant stenosis of the right carotid bifurcation. 4. There is an abrupt occlusion of the proximal left internal carotid artery without reconstitution distally.  CONCLUSION: Occluded left internal carotid artery.  Waverly Ferrari, MD, FACS [facility]  DATE OF OPERATION: 08/19/2011 DATE OF DICTATION: 08/19/2011

## 2011-08-19 NOTE — Consult Note (Signed)
Pt smokes a cigar daily but plans to quit on his own after d/c. Encouraged pt to quit. Referred to 1-800 quit now for f/u and support. Discussed oral fixation substitutes, second hand smoke and in home smoking policy. Reviewed and gave pt Written education/contact information.

## 2011-08-19 NOTE — Progress Notes (Signed)
Subjective: No new complaints. Left eye blind .    Objective: Vital signs in last 24 hours: Filed Vitals:   08/18/11 2100 08/19/11 0130 08/19/11 0500 08/19/11 1500  BP: 114/69 131/96 120/75 129/90  Pulse: 54 70 84 86  Temp: 98 F (36.7 C) 98 F (36.7 C) 97.7 F (36.5 C) 98.3 F (36.8 C)  TempSrc: Oral Oral Oral Oral  Resp: 24 18 20 20   Height:      Weight:      SpO2: 99% 98% 97% 97%   Weight change:  No intake or output data in the 24 hours ending 08/19/11 1742 Physical Exam: Lungs clear to auscultation bilaterally without wheeze rhonchi or rales Cardiovascular irregularly irregular Abdomen soft nontender Extremities no clubbing cyanosis or edema Neurologic: Decreased visual acuity left eye, otherwise nonfocal  Lab Results: Basic Metabolic Panel:  Lab 08/17/11 1610  NA 137  K 4.0  CL 106  CO2 22  GLUCOSE 94  BUN 18  CREATININE 1.13  CALCIUM 9.8  MG --  PHOS --   Liver Function Tests: CBC:  Lab 08/19/11 0123 08/18/11 0633 08/17/11 1231  WBC 9.0 8.0 --  NEUTROABS -- -- 4.8  HGB 15.3 15.6 --  HCT 44.0 45.9 --  MCV 89.1 88.6 --  PLT 217 204 --   CBG:  Lab 08/19/11 1140 08/19/11 0733 08/18/11 2052 08/18/11 1706 08/18/11 1136 08/18/11 0717  GLUCAP 91 95 102* 88 94 94   Hemoglobin A1C:  Lab 08/18/11 0633  HGBA1C 5.8*   Fasting Lipid Panel:  Lab 08/18/11 0641  CHOL 193  HDL 41  LDLCALC 136*  TRIG 80  CHOLHDL 4.7  LDLDIRECT --   Thyroid Function Tests: No results found for this basename: TSH,T4TOTAL,FREET4,T3FREE,THYROIDAB in the last 168 hours Coagulation:  Lab 08/18/11 0633 08/17/11 1231  LABPROT 14.2 13.0  INR 1.08 0.96   Micro Results: No results found for this or any previous visit (from the past 240 hour(s)). Studies/Results: US Carotid Duplex Bilateral  08/18/2011  *RADIOLOGY REPORT*  Clinical Data: Left PCA infarct.  BILATERAL CAROTID DUPLEX ULTRASOUND  Technique: Wallace Cullens scale imaging, color Doppler and duplex ultrasound was  performed of bilateral carotid and vertebral arteries in the neck.  Comparison:  None  Criteria:  Quantification of carotid stenosis is based on velocity parameters that correlate the residual internal carotid diameter with NASCET-based stenosis levels, using the diameter of the distal internal carotid lumen as the denominator for stenosis measurement.  The following velocity measurements were obtained:                   PEAK SYSTOLIC/END DIASTOLIC RIGHT ICA:                        107/49cm/sec CCA:                        107/22cm/sec SYSTOLIC ICA/CCA RATIO:     1.0 DIASTOLIC ICA/CCA RATIO:    2.28 ECA:                        132cm/sec  LEFT ICA:                        18/2cm/sec CCA:                        91/9cm/sec SYSTOLIC ICA/CCA RATIO:     0.19  DIASTOLIC ICA/CCA RATIO:    0.19 ECA:                        58cm/sec  Findings:  RIGHT CAROTID ARTERY: Moderate soft plaque throughout the distal common carotid artery, carotid bulb, ICA and ECA without flow- limiting stenosis, less than 50%.  RIGHT VERTEBRAL ARTERY:  Antegrade flow.  LEFT CAROTID ARTERY: Extensive plaque formation in the carotid bulb and ICA.  Subtotal occlusion of the left ICA with only a slow trickle of flow.  LEFT VERTEBRAL ARTERY:  Antegrade flow.  IMPRESSION: Critical/subtotal occlusion of the left ICA.  Moderate disease in the right ICA without flow limiting stenosis.  Original Report Authenticated By: Cyndie Chime, M.D.   Scheduled Meds:    . acetaZOLAMIDE  500 mg Intravenous Once  . aspirin EC  325 mg Oral Daily  . heparin      . insulin aspart  0-15 Units Subcutaneous TID WC  . isosorbide mononitrate  30 mg Oral Daily  . labetalol      . lidocaine      . metoprolol  50 mg Oral BID  . simvastatin  40 mg Oral Daily  . sodium chloride  3 mL Intravenous Q12H   Continuous Infusions:    . heparin    . DISCONTD: 0.9 % NaCl with KCl 20 mEq / L 75 mL/hr at 08/18/11 1854  . DISCONTD: heparin 1,700 Units/hr (08/18/11 1046)  .  DISCONTD: heparin     PRN Meds:.sodium chloride, acetaminophen, sodium chloride Assessment/Plan: Principal Problem:  *CVA (cerebral infarction) Active Problems:  DIAB W/O COMP TYPE II/UNS NOT STATED UNCNTRL: Continue insulin  DYSLIPIDEMIA: Continue simvastatin HTN: - Controlled on metoprolol and Imdur    Atrial fibrillation  Left  Carotid occlusion    S/p angiogram 08/19/11 - plan for medical therapy only using heparin and Coumadin   LOS: 2 days   Megyn Leng 08/19/2011, 5:42 PM

## 2011-08-20 LAB — GLUCOSE, CAPILLARY
Glucose-Capillary: 95 mg/dL (ref 70–99)
Glucose-Capillary: 145 mg/dL — ABNORMAL HIGH (ref 70–99)
Glucose-Capillary: 89 mg/dL (ref 70–99)

## 2011-08-20 LAB — CBC
HCT: 44.8 % (ref 39.0–52.0)
MCHC: 34.6 g/dL (ref 30.0–36.0)
MCV: 88.9 fL (ref 78.0–100.0)
RDW: 13.2 % (ref 11.5–15.5)

## 2011-08-20 MED ORDER — WARFARIN SODIUM 7.5 MG PO TABS
15.0000 mg | ORAL_TABLET | Freq: Once | ORAL | Status: AC
  Administered 2011-08-20: 15 mg via ORAL
  Filled 2011-08-20: qty 2

## 2011-08-20 NOTE — Progress Notes (Signed)
[name]  WJX:[id number]  DOB: 03/05/1956  DOA: 08/17/2011  Subjective: Ambulating in the hallway, no new complaints, left eye blindness  Objective: Weight change:   Intake/Output Summary (Last 24 hours) at 08/20/11 1404 Last data filed at 08/20/11 0900  Gross per 24 hour  Intake    240 ml  Output      0 ml  Net    240 ml   Blood pressure 114/79, pulse 78, temperature 97.6 F (36.4 C), temperature source Oral, resp. rate 16, height 6\' 1"  (1.854 m), weight 121.428 kg (267 lb 11.2 oz), SpO2 100.00%.  Physical Exam: General: Alert and awake, oriented x3, not in any acute distress. HEENT: anicteric sclera, pupils reactive to light and accommodation, EOMI CVS: S1-S2 clear, no murmur rubs or gallops, irregular Chest: clear to auscultation bilaterally, no wheezing, rales or rhonchi Abdomen: soft nontender, nondistended, normal bowel sounds, no organomegaly Extremities: no cyanosis, clubbing or edema noted bilaterally Neuro:  no focal neurological deficits, decrease visual acuity left eye  Lab Results: Basic Metabolic Panel:  Lab 08/17/11 2130  NA 137  K 4.0  CL 106  CO2 22  GLUCOSE 94  BUN 18  CREATININE 1.13  CALCIUM 9.8  MG --  PHOS --  CBC:  Lab 08/20/11 0600 08/19/11 0123 08/17/11 1231  WBC 8.1 9.0 --  NEUTROABS -- -- 4.8  HGB 15.5 15.3 --  HCT 44.8 44.0 --  MCV 88.9 89.1 --  PLT 231 217 --      Lab 08/20/11 1246 08/20/11 0742 08/19/11 2137 08/19/11 1140 08/19/11 0733  GLUCAP 145* 95 89 91 95    Studies/Results: Ct Head Wo Contrast  08/17/2011  *RADIOLOGY REPORT*  Clinical Data: Visual field changes in the left thigh.  CT HEAD WITHOUT CONTRAST  Technique:  Contiguous axial images were obtained from the base of the skull through the vertex without contrast.  Comparison: 04/21/2010  Findings: No acute intracranial abnormality.  Specifically, no hemorrhage, hydrocephalus, mass lesion, acute infarction, or significant intracranial injury.  No acute  calvarial abnormality.  Short air fluid levels are seen in the maxillary sinuses bilaterally, a nonspecific finding.  This may reflect acute sinusitis.  IMPRESSION: No acute intracranial abnormality.  Small air-fluid levels in the maxillary sinuses bilaterally.  Original Report Authenticated By: Cyndie Chime, M.D.   Mr Angiogram Head Wo Contrast  08/17/2011  *RADIOLOGY REPORT*  Clinical Data:  55 year old male with bilateral visual loss times 2 days.  Bilateral hand numbness.  Contrast: 20mL MULTIHANCE GADOBENATE DIMEGLUMINE 529 MG/ML IV SOLN  Comparison: Head CTs 08/17/2011, 04/21/2010.  MRI HEAD WITHOUT AND WITH CONTRAST    IMPRESSION: 1.  Small acute left PCA infarct involving the left occipital lobe/cuneus.  No mass effect or hemorrhage. 2.  Left ICA occlusion or slow flow, likely chronic in the absence of left anterior circulation findings. MRA findings are below.  MRA NECK WITHOUT AND WITH CONTRAST  IMPRESSION: 1.  Abrupt occlusion of the left ICA just beyond its origin. 2.  Mild atherosclerosis at the right carotid bifurcation without significant right ICA stenosis. 3.  Negative cervical vertebral arteries. 4.  Intracranial findings are below.   MRA HEAD WITHOUT CONTRAST  IMPRESSION: 1.  Occluded ICA siphon.  Reconstituted flow at the left ICA terminus probably from the posterior communicating artery.  Mildly attenuated flow in the left MCA territory. 2.  Atherosclerosis and stenosis at the right vertebrobasilar junction, but these intracranial MRA images likely overestimate the degree of proximal basilar stenosis -  the post contrast images of the basilar artery on the neck MRA portion do not indicate any proximal basilar stenosis.  Original Report Authenticated By: Harley Hallmark, M.D.   Mr Angiogram Neck W Wo Contrast  08/17/2011  *RADIOLOGY REPORT*  Clinical Data:  55 year old male with bilateral visual loss times 2 days.  Bilateral hand numbness.  Contrast: 20mL MULTIHANCE GADOBENATE  DIMEGLUMINE 529 MG/ML IV SOLN  Comparison: Head CTs 08/17/2011, 04/21/2010.  MRI HEAD WITHOUT AND WITH CONTRAST  Technique: Multiplanar, multiecho pulse sequences of the brain and surrounding structures were obtained according to standard protocol without and with intravenous contrast.  Findings:  Study is intermittently degraded by motion artifact despite repeated imaging attempts.  There is a smaller cortically based restricted diffusion in the left occipital lobe, cephalad to the calcarine fissure.  There is associated T2 and FLAIR hyperintensity.  No mass effect or hemorrhage.  Major intracranial vascular flow voids absent flow void in the left ICA siphon and visualized distal left cervical ICA.  See MRA findings below.  Other major intracranial vascular flow voids are preserved.   No abnormal enhancement identified.  No ventriculomegaly. No acute intracranial hemorrhage identified.  No intracranial mass lesion. Negative visualized cervical spine.  Normal bone marrow signal. Negative pituitary. Visualized orbit soft tissues are within normal limits.  Small fluid levels in both maxillary sinuses.  Mastoids are clear.  Negative scalp soft tissues.  IMPRESSION: 1.  Small acute left PCA infarct involving the left occipital lobe/cuneus.  No mass effect or hemorrhage. 2.  Left ICA occlusion or slow flow, likely chronic in the absence of left anterior circulation findings. MRA findings are below.  MRA NECK WITHOUT AND WITH CONTRAST  Technique:  Angiographic images of the neck were obtained using MRA technique without and with intravenous contrast.  Carotid stenosis measurements (when applicable) are obtained utilizing NASCET criteria, using the distal internal carotid diameter as the denominator.  Findings:  Precontrast time-of-flight images reveal antegrade flow in both common carotid and vertebral arteries.  There is no antegrade flow signal evident in the cervical left ICA.  Antegrade signal continues in the right  ICA and both vertebral arteries.  Following contrast, bovine arch configuration is noted.  Common carotid artery origins are within normal limits.  The right common carotid artery is within normal limits.  The right carotid bifurcation is mildly irregular.  Filling defect in the posterior right ICA origin and bulb is compatible with atherosclerotic plaque.  Subsequent stenosis is less than 50 % with respect to the distal vessel.  The cervical right ICA otherwise is normal.  Mildly tortuous left common carotid artery which otherwise appears within normal limits.  There is little irregularity at the left carotid bifurcation.  The left ICA origin appears patent but abruptly occludes 9 mm later at the level of the distal bulb (series 50 image 6).  Left ECA remains patent.  Both vertebral artery origins are within normal limits, the right is less well visualized.  The vertebral arteries are codominant and within normal limits throughout the neck.  IMPRESSION: 1.  Abrupt occlusion of the left ICA just beyond its origin. 2.  Mild atherosclerosis at the right carotid bifurcation without significant right ICA stenosis. 3.  Negative cervical vertebral arteries. 4.  Intracranial findings are below.  MRA HEAD WITHOUT CONTRAST  Technique: Angiographic images of the Circle of Willis were obtained using MRA technique without  intravenous contrast.  Findings:  Antegrade flow in the posterior circulation with codominant distal vertebral  arteries.  The right PICA is patent. There is a dominant appearing left AICA.  The distal left vertebral artery is within normal limits.  There is moderate to severe irregularity at the right vertebrobasilar junction and continuing into the proximal and mid basilar artery. These time-of-flight images suggest mild to moderate basilar stenosis, but the same basilar artery segment visualized post contrast on the above MRA appears without significant stenosis. The distal basilar is patent and within  normal limits.  Superior cerebellar arteries and PCA origins are within normal limits. There is moderate irregularity of the bilateral PCA branches. Stenosis appears worst in the right P2 segment.  Distal PCA flow appears relatively symmetric.  Both posterior communicating arteries are present, the right is diminutive.  No antegrade flow in the left ICA siphon.  Flow at the left ICA terminus is reconstituted probably from the left posterior communicating artery.  Antegrade flow in the right ICA siphon.  Cavernous segment irregularity without right ICA stenosis.  Patent right ICA terminus.  Decreased left ACA A1 segment flow.  Normal right ACA.  Beyond the anterior communicating artery ACA branch flow appears symmetric. Normal visualized right MCA branches.  Mildly attenuated flow in the left MCA M1 segment.  Mild to moderately attenuated left MCA branch flow compared the right, more so in the anterior sylvian division.  IMPRESSION: 1.  Occluded ICA siphon.  Reconstituted flow at the left ICA terminus probably from the posterior communicating artery.  Mildly attenuated flow in the left MCA territory. 2.  Atherosclerosis and stenosis at the right vertebrobasilar junction, but these intracranial MRA images likely overestimate the degree of proximal basilar stenosis - the post contrast images of the basilar artery on the neck MRA portion do not indicate any proximal basilar stenosis.  Original Report Authenticated By: Harley Hallmark, M.D.   Mr Laqueta Jean Wo Contrast  08/17/2011  *RADIOLOGY REPORT*  Clinical Data:  55 year old male with bilateral visual loss times 2 days.  Bilateral hand numbness.  Contrast: 20mL MULTIHANCE GADOBENATE DIMEGLUMINE 529 MG/ML IV SOLN  Comparison: Head CTs 08/17/2011, 04/21/2010.  MRI HEAD WITHOUT AND WITH CONTRAST  Technique: Multiplanar, multiecho pulse sequences of the brain and surrounding structures were obtained according to standard protocol without and with intravenous contrast.   Findings:  Study is intermittently degraded by motion artifact despite repeated imaging attempts.  There is a smaller cortically based restricted diffusion in the left occipital lobe, cephalad to the calcarine fissure.  There is associated T2 and FLAIR hyperintensity.  No mass effect or hemorrhage.  Major intracranial vascular flow voids absent flow void in the left ICA siphon and visualized distal left cervical ICA.  See MRA findings below.  Other major intracranial vascular flow voids are preserved.   No abnormal enhancement identified.  No ventriculomegaly. No acute intracranial hemorrhage identified.  No intracranial mass lesion. Negative visualized cervical spine.  Normal bone marrow signal. Negative pituitary. Visualized orbit soft tissues are within normal limits.  Small fluid levels in both maxillary sinuses.  Mastoids are clear.  Negative scalp soft tissues.  IMPRESSION: 1.  Small acute left PCA infarct involving the left occipital lobe/cuneus.  No mass effect or hemorrhage. 2.  Left ICA occlusion or slow flow, likely chronic in the absence of left anterior circulation findings. MRA findings are below.  MRA NECK WITHOUT AND WITH CONTRAST  Technique:  Angiographic images of the neck were obtained using MRA technique without and with intravenous contrast.  Carotid stenosis measurements (when applicable) are obtained utilizing  NASCET criteria, using the distal internal carotid diameter as the denominator.  Findings:  Precontrast time-of-flight images reveal antegrade flow in both common carotid and vertebral arteries.  There is no antegrade flow signal evident in the cervical left ICA.  Antegrade signal continues in the right ICA and both vertebral arteries.  Following contrast, bovine arch configuration is noted.  Common carotid artery origins are within normal limits.  The right common carotid artery is within normal limits.  The right carotid bifurcation is mildly irregular.  Filling defect in the posterior  right ICA origin and bulb is compatible with atherosclerotic plaque.  Subsequent stenosis is less than 50 % with respect to the distal vessel.  The cervical right ICA otherwise is normal.  Mildly tortuous left common carotid artery which otherwise appears within normal limits.  There is little irregularity at the left carotid bifurcation.  The left ICA origin appears patent but abruptly occludes 9 mm later at the level of the distal bulb (series 50 image 6).  Left ECA remains patent.  Both vertebral artery origins are within normal limits, the right is less well visualized.  The vertebral arteries are codominant and within normal limits throughout the neck.  IMPRESSION: 1.  Abrupt occlusion of the left ICA just beyond its origin. 2.  Mild atherosclerosis at the right carotid bifurcation without significant right ICA stenosis. 3.  Negative cervical vertebral arteries. 4.  Intracranial findings are below.  MRA HEAD WITHOUT CONTRAST  Technique: Angiographic images of the Circle of Willis were obtained using MRA technique without  intravenous contrast.  Findings:  Antegrade flow in the posterior circulation with codominant distal vertebral arteries.  The right PICA is patent. There is a dominant appearing left AICA.  The distal left vertebral artery is within normal limits.  There is moderate to severe irregularity at the right vertebrobasilar junction and continuing into the proximal and mid basilar artery. These time-of-flight images suggest mild to moderate basilar stenosis, but the same basilar artery segment visualized post contrast on the above MRA appears without significant stenosis. The distal basilar is patent and within normal limits.  Superior cerebellar arteries and PCA origins are within normal limits. There is moderate irregularity of the bilateral PCA branches. Stenosis appears worst in the right P2 segment.  Distal PCA flow appears relatively symmetric.  Both posterior communicating arteries are  present, the right is diminutive.  No antegrade flow in the left ICA siphon.  Flow at the left ICA terminus is reconstituted probably from the left posterior communicating artery.  Antegrade flow in the right ICA siphon.  Cavernous segment irregularity without right ICA stenosis.  Patent right ICA terminus.  Decreased left ACA A1 segment flow.  Normal right ACA.  Beyond the anterior communicating artery ACA branch flow appears symmetric. Normal visualized right MCA branches.  Mildly attenuated flow in the left MCA M1 segment.  Mild to moderately attenuated left MCA branch flow compared the right, more so in the anterior sylvian division.  IMPRESSION: 1.  Occluded ICA siphon.  Reconstituted flow at the left ICA terminus probably from the posterior communicating artery.  Mildly attenuated flow in the left MCA territory. 2.  Atherosclerosis and stenosis at the right vertebrobasilar junction, but these intracranial MRA images likely overestimate the degree of proximal basilar stenosis - the post contrast images of the basilar artery on the neck MRA portion do not indicate any proximal basilar stenosis.  Original Report Authenticated By: Harley Hallmark, M.D.   US Carotid Duplex Bilateral  08/18/2011  *RADIOLOGY REPORT*  Clinical Data: Left PCA infarct.  BILATERAL CAROTID DUPLEX ULTRASOUND  Technique: Wallace Cullens scale imaging, color Doppler and duplex ultrasound was performed of bilateral carotid and vertebral arteries in the neck.  Comparison:  None  Criteria:  Quantification of carotid stenosis is based on velocity parameters that correlate the residual internal carotid diameter with NASCET-based stenosis levels, using the diameter of the distal internal carotid lumen as the denominator for stenosis measurement.  The following velocity measurements were obtained:                   PEAK SYSTOLIC/END DIASTOLIC RIGHT ICA:                        107/49cm/sec CCA:                        107/22cm/sec SYSTOLIC ICA/CCA RATIO:      1.0 DIASTOLIC ICA/CCA RATIO:    2.28 ECA:                        132cm/sec  LEFT ICA:                        18/2cm/sec CCA:                        91/9cm/sec SYSTOLIC ICA/CCA RATIO:     0.19 DIASTOLIC ICA/CCA RATIO:    0.19 ECA:                        58cm/sec  Findings:  RIGHT CAROTID ARTERY: Moderate soft plaque throughout the distal common carotid artery, carotid bulb, ICA and ECA without flow- limiting stenosis, less than 50%.  RIGHT VERTEBRAL ARTERY:  Antegrade flow.  LEFT CAROTID ARTERY: Extensive plaque formation in the carotid bulb and ICA.  Subtotal occlusion of the left ICA with only a slow trickle of flow.  LEFT VERTEBRAL ARTERY:  Antegrade flow.  IMPRESSION: Critical/subtotal occlusion of the left ICA.  Moderate disease in the right ICA without flow limiting stenosis.  Original Report Authenticated By: Cyndie Chime, M.D.    Medications: Scheduled Meds:   . acetaZOLAMIDE  500 mg Intravenous Once  . aspirin EC  325 mg Oral Daily  . insulin aspart  0-15 Units Subcutaneous TID WC  . isosorbide mononitrate  30 mg Oral Daily  . labetalol      . metoprolol  50 mg Oral BID  . simvastatin  40 mg Oral Daily  . sodium chloride  3 mL Intravenous Q12H  . warfarin  10 mg Oral ONCE-1800  . warfarin  15 mg Oral ONCE-1800   Continuous Infusions:   . heparin 1,800 Units/hr (08/20/11 1136)  . DISCONTD: 0.9 % NaCl with KCl 20 mEq / L 75 mL/hr at 08/18/11 1854  . DISCONTD: heparin 1,700 Units/hr (08/18/11 1046)  . DISCONTD: heparin       Assessment/Plan:  Principal Problem:  CVA (cerebral infarction)   Active Problems:  DIAB W/O COMP TYPE II/UNS NOT STATED UNCNTRL: Continue insulin   DYSLIPIDEMIA: Continue simvastatin   HTN: - Controlled on metoprolol and Imdur   Atrial fibrillation : rate controlled, on heparin and Coumadin  Left Carotid occlusion S/p angiogram 08/19/11 - plan for medical therapy only using heparin and Coumadin, INR 1.18 today.     LOS: 3 days  [name] 08/20/2011, 2:04 PM

## 2011-08-20 NOTE — Progress Notes (Signed)
ANTICOAGULATION CONSULT NOTE - Initial Consult  Pharmacy Consult for Coumadin Indication: atrial fibrillation/new onset stroke 2/2 L internal carotid artery occlusion   Allergies  Allergen Reactions  . Other     Mayonnaise  . Penicillins Rash    Patient Measurements: Height: 6\' 1"  (185.4 cm) Weight: 267 lb 11.2 oz (121.428 kg) IBW/kg (Calculated) : 79.9   Vital Signs: Temp: 98.2 F (36.8 C) (12/14 2200) Temp src: Oral (12/14 1500) BP: 112/68 mmHg (12/14 2200) Pulse Rate: 79  (12/14 2200)  Labs:  Basename 08/20/11 0024 08/19/11 0123 08/18/11 1720 08/18/11 9604 08/17/11 1231  HGB -- 15.3 -- 15.6 --  HCT -- 44.0 -- 45.9 43.4  PLT -- 217 -- 204 199  APTT -- -- -- -- --  LABPROT -- -- -- 14.2 13.0  INR -- -- -- 1.08 0.96  HEPARINUNFRC 0.20* 0.44 0.17* -- --  CREATININE -- -- -- -- 1.13  CKTOTAL -- -- -- -- --  CKMB -- -- -- -- --  TROPONINI -- -- -- -- --   Estimated Creatinine Clearance: 100.8 ml/min (by C-G formula based on Cr of 1.13).  Medical History: Past Medical History  Diagnosis Date  . Hyperlipidemia   . Gilbert's syndrome   . Vertigo   . Diabetes mellitus     diet controlled  . Angina   . Hypertension   . Atrial fibrillation   . Atrial flutter 2007    h/o  . Stroke ~08/17/11    "lost vision left eye"  . Blood dyscrasia     Assessment: 41 yom with h/o Afib and new onset stroke, s/p cerebral angiogram, for Heparin  Goal of Therapy:  Heparin level 0.3-0.5   Plan:  Patient was previously therapeutic at this rate.  Continue Heparin at current rate and recheck level in am.  Anticipate Heparin level may increase into goal range with time.  Bernis Schreur, Gary Fleet 08/20/2011,1:44 AM

## 2011-08-20 NOTE — Progress Notes (Signed)
ANTICOAGULATION CONSULT NOTE - Initial Consult  Pharmacy Consult for Coumadin and Heparin Indication: atrial fibrillation/new onset stroke 2/2 L internal carotid artery occlusion  Assessment: 39 yom with h/o Afib and new onset stroke, s/p cerebral angiogram, for Heparin bridge to coumadin. Patient Heparin level is now therapeutic. INR is subtherapeutic. Per [facility] patient on 15mg  coumadin at home. Will  Increase coumadin tonight   Goal of Therapy:  Heparin level 0.3-0.5 INR 2-3   Plan:  - Continue Heparin at current rate and recheck level in am.  - Coumadin 15 mg po x1 dose tonight at 1800 - f/u INR in am   Allergies  Allergen Reactions  . Other     Mayonnaise  . Penicillins Rash    Patient Measurements: Height: 6\' 1"  (185.4 cm) Weight: 267 lb 11.2 oz (121.428 kg) IBW/kg (Calculated) : 79.9   Vital Signs: Temp: 97.6 F (36.4 C) (12/15 0627) BP: 114/79 mmHg (12/15 0627) Pulse Rate: 78  (12/15 0627)  Labs:  Basename 08/20/11 0600 08/20/11 0024 08/19/11 0123 08/18/11 0633 08/17/11 1231  HGB 15.5 -- 15.3 -- --  HCT 44.8 -- 44.0 45.9 --  PLT 231 -- 217 204 --  APTT -- -- -- -- --  LABPROT 15.2 -- -- 14.2 13.0  INR 1.18 -- -- 1.08 0.96  HEPARINUNFRC 0.42 0.20* 0.44 -- --  CREATININE -- -- -- -- 1.13  CKTOTAL -- -- -- -- --  CKMB -- -- -- -- --  TROPONINI -- -- -- -- --   Estimated Creatinine Clearance: 100.8 ml/min (by C-G formula based on Cr of 1.13).  Medical History: Past Medical History  Diagnosis Date  . Hyperlipidemia   . Gilbert's syndrome   . Vertigo   . Diabetes mellitus     diet controlled  . Angina   . Hypertension   . Atrial fibrillation   . Atrial flutter 2007    h/o  . Stroke ~08/17/11    "lost vision left eye"  . Blood dyscrasia        Janace Litten, PharmD [phone] 08/20/2011,8:29 AM

## 2011-08-21 LAB — GLUCOSE, CAPILLARY
Glucose-Capillary: 121 mg/dL — ABNORMAL HIGH (ref 70–99)
Glucose-Capillary: 85 mg/dL (ref 70–99)

## 2011-08-21 LAB — PROTIME-INR: INR: 1.22 (ref 0.00–1.49)

## 2011-08-21 LAB — HEPARIN LEVEL (UNFRACTIONATED): Heparin Unfractionated: 0.38 IU/mL (ref 0.30–0.70)

## 2011-08-21 MED ORDER — WARFARIN SODIUM 7.5 MG PO TABS
15.0000 mg | ORAL_TABLET | Freq: Once | ORAL | Status: AC
  Administered 2011-08-21: 15 mg via ORAL
  Filled 2011-08-21: qty 2

## 2011-08-21 NOTE — Progress Notes (Signed)
[name]  EAV:[id number]  DOB: [birth year]  DOA: 08/17/2011  Subjective: S: no new complaints, left eye blindness  Objective: Weight change:  No intake or output data in the 24 hours ending 08/21/11 1321 Blood pressure 117/77, pulse 74, temperature 98 F (36.7 C), temperature source Oral, resp. rate 16, height 6\' 1"  (1.854 m), weight 121.428 kg (267 lb 11.2 oz), SpO2 98.00%.  Physical Exam: General: Alert and awake, oriented x3, not in any acute distress. HEENT: anicteric sclera, pupils reactive to light and accommodation, EOMI CVS: S1-S2 clear, no murmur rubs or gallops, irregular Chest: clear to auscultation bilaterally, no wheezing, rales or rhonchi Abdomen: soft nontender, nondistended, normal bowel sounds, no organomegaly Extremities: no cyanosis, clubbing or edema noted bilaterally Neuro:  no focal neurological deficits, decrease visual acuity left eye  Lab Results: Basic Metabolic Panel:  Lab 08/17/11 7829  NA 137  K 4.0  CL 106  CO2 22  GLUCOSE 94  BUN 18  CREATININE 1.13  CALCIUM 9.8  MG --  PHOS --  CBC:  Lab 08/20/11 0600 08/19/11 0123 08/17/11 1231  WBC 8.1 9.0 --  NEUTROABS -- -- 4.8  HGB 15.5 15.3 --  HCT 44.8 44.0 --  MCV 88.9 89.1 --  PLT 231 217 --      Lab 08/21/11 1159 08/21/11 0733 08/20/11 2102 08/20/11 1714 08/20/11 1246  GLUCAP 121* 104* 107* 89 145*    Studies/Results: Ct Head Wo Contrast  08/17/2011  *RADIOLOGY REPORT*  Clinical Data: Visual field changes in the left thigh.  CT HEAD WITHOUT CONTRAST  Technique:  Contiguous axial images were obtained from the base of the skull through the vertex without contrast.  Comparison: 04/21/2010  Findings: No acute intracranial abnormality.  Specifically, no hemorrhage, hydrocephalus, mass lesion, acute infarction, or significant intracranial injury.  No acute calvarial abnormality.  Short air fluid levels are seen in the maxillary sinuses bilaterally, a nonspecific finding.  This may reflect  acute sinusitis.  IMPRESSION: No acute intracranial abnormality.  Small air-fluid levels in the maxillary sinuses bilaterally.  Original Report Authenticated By: Cyndie Chime, M.D.   Mr Angiogram Head Wo Contrast  08/17/2011  *RADIOLOGY REPORT*  Clinical Data:  55 year old male with bilateral visual loss times 2 days.  Bilateral hand numbness.  Contrast: 20mL MULTIHANCE GADOBENATE DIMEGLUMINE 529 MG/ML IV SOLN  Comparison: Head CTs 08/17/2011, 04/21/2010.  MRI HEAD WITHOUT AND WITH CONTRAST    IMPRESSION: 1.  Small acute left PCA infarct involving the left occipital lobe/cuneus.  No mass effect or hemorrhage. 2.  Left ICA occlusion or slow flow, likely chronic in the absence of left anterior circulation findings. MRA findings are below.  MRA NECK WITHOUT AND WITH CONTRAST  IMPRESSION: 1.  Abrupt occlusion of the left ICA just beyond its origin. 2.  Mild atherosclerosis at the right carotid bifurcation without significant right ICA stenosis. 3.  Negative cervical vertebral arteries. 4.  Intracranial findings are below.   MRA HEAD WITHOUT CONTRAST  IMPRESSION: 1.  Occluded ICA siphon.  Reconstituted flow at the left ICA terminus probably from the posterior communicating artery.  Mildly attenuated flow in the left MCA territory. 2.  Atherosclerosis and stenosis at the right vertebrobasilar junction, but these intracranial MRA images likely overestimate the degree of proximal basilar stenosis - the post contrast images of the basilar artery on the neck MRA portion do not indicate any proximal basilar stenosis.  Original Report Authenticated By: Harley Hallmark, M.D.   Mr Angiogram Neck W Wo  Contrast  08/17/2011  *RADIOLOGY REPORT*  Clinical Data:  55 year old male with bilateral visual loss times 2 days.  Bilateral hand numbness.  Contrast: 20mL MULTIHANCE GADOBENATE DIMEGLUMINE 529 MG/ML IV SOLN  Comparison: Head CTs 08/17/2011, 04/21/2010.  MRI HEAD WITHOUT AND WITH CONTRAST  Technique: Multiplanar,  multiecho pulse sequences of the brain and surrounding structures were obtained according to standard protocol without and with intravenous contrast.  Findings:  Study is intermittently degraded by motion artifact despite repeated imaging attempts.  There is a smaller cortically based restricted diffusion in the left occipital lobe, cephalad to the calcarine fissure.  There is associated T2 and FLAIR hyperintensity.  No mass effect or hemorrhage.  Major intracranial vascular flow voids absent flow void in the left ICA siphon and visualized distal left cervical ICA.  See MRA findings below.  Other major intracranial vascular flow voids are preserved.   No abnormal enhancement identified.  No ventriculomegaly. No acute intracranial hemorrhage identified.  No intracranial mass lesion. Negative visualized cervical spine.  Normal bone marrow signal. Negative pituitary. Visualized orbit soft tissues are within normal limits.  Small fluid levels in both maxillary sinuses.  Mastoids are clear.  Negative scalp soft tissues.  IMPRESSION: 1.  Small acute left PCA infarct involving the left occipital lobe/cuneus.  No mass effect or hemorrhage. 2.  Left ICA occlusion or slow flow, likely chronic in the absence of left anterior circulation findings. MRA findings are below.  MRA NECK WITHOUT AND WITH CONTRAST  Technique:  Angiographic images of the neck were obtained using MRA technique without and with intravenous contrast.  Carotid stenosis measurements (when applicable) are obtained utilizing NASCET criteria, using the distal internal carotid diameter as the denominator.  Findings:  Precontrast time-of-flight images reveal antegrade flow in both common carotid and vertebral arteries.  There is no antegrade flow signal evident in the cervical left ICA.  Antegrade signal continues in the right ICA and both vertebral arteries.  Following contrast, bovine arch configuration is noted.  Common carotid artery origins are within normal  limits.  The right common carotid artery is within normal limits.  The right carotid bifurcation is mildly irregular.  Filling defect in the posterior right ICA origin and bulb is compatible with atherosclerotic plaque.  Subsequent stenosis is less than 50 % with respect to the distal vessel.  The cervical right ICA otherwise is normal.  Mildly tortuous left common carotid artery which otherwise appears within normal limits.  There is little irregularity at the left carotid bifurcation.  The left ICA origin appears patent but abruptly occludes 9 mm later at the level of the distal bulb (series 50 image 6).  Left ECA remains patent.  Both vertebral artery origins are within normal limits, the right is less well visualized.  The vertebral arteries are codominant and within normal limits throughout the neck.  IMPRESSION: 1.  Abrupt occlusion of the left ICA just beyond its origin. 2.  Mild atherosclerosis at the right carotid bifurcation without significant right ICA stenosis. 3.  Negative cervical vertebral arteries. 4.  Intracranial findings are below.  MRA HEAD WITHOUT CONTRAST  Technique: Angiographic images of the Circle of Willis were obtained using MRA technique without  intravenous contrast.  Findings:  Antegrade flow in the posterior circulation with codominant distal vertebral arteries.  The right PICA is patent. There is a dominant appearing left AICA.  The distal left vertebral artery is within normal limits.  There is moderate to severe irregularity at the right vertebrobasilar junction  and continuing into the proximal and mid basilar artery. These time-of-flight images suggest mild to moderate basilar stenosis, but the same basilar artery segment visualized post contrast on the above MRA appears without significant stenosis. The distal basilar is patent and within normal limits.  Superior cerebellar arteries and PCA origins are within normal limits. There is moderate irregularity of the bilateral PCA  branches. Stenosis appears worst in the right P2 segment.  Distal PCA flow appears relatively symmetric.  Both posterior communicating arteries are present, the right is diminutive.  No antegrade flow in the left ICA siphon.  Flow at the left ICA terminus is reconstituted probably from the left posterior communicating artery.  Antegrade flow in the right ICA siphon.  Cavernous segment irregularity without right ICA stenosis.  Patent right ICA terminus.  Decreased left ACA A1 segment flow.  Normal right ACA.  Beyond the anterior communicating artery ACA branch flow appears symmetric. Normal visualized right MCA branches.  Mildly attenuated flow in the left MCA M1 segment.  Mild to moderately attenuated left MCA branch flow compared the right, more so in the anterior sylvian division.  IMPRESSION: 1.  Occluded ICA siphon.  Reconstituted flow at the left ICA terminus probably from the posterior communicating artery.  Mildly attenuated flow in the left MCA territory. 2.  Atherosclerosis and stenosis at the right vertebrobasilar junction, but these intracranial MRA images likely overestimate the degree of proximal basilar stenosis - the post contrast images of the basilar artery on the neck MRA portion do not indicate any proximal basilar stenosis.  Original Report Authenticated By: Harley Hallmark, M.D.   Mr Laqueta Jean Wo Contrast  08/17/2011  *RADIOLOGY REPORT*  Clinical Data:  55 year old male with bilateral visual loss times 2 days.  Bilateral hand numbness.  Contrast: 20mL MULTIHANCE GADOBENATE DIMEGLUMINE 529 MG/ML IV SOLN  Comparison: Head CTs 08/17/2011, 04/21/2010.  MRI HEAD WITHOUT AND WITH CONTRAST  Technique: Multiplanar, multiecho pulse sequences of the brain and surrounding structures were obtained according to standard protocol without and with intravenous contrast.  Findings:  Study is intermittently degraded by motion artifact despite repeated imaging attempts.  There is a smaller cortically based  restricted diffusion in the left occipital lobe, cephalad to the calcarine fissure.  There is associated T2 and FLAIR hyperintensity.  No mass effect or hemorrhage.  Major intracranial vascular flow voids absent flow void in the left ICA siphon and visualized distal left cervical ICA.  See MRA findings below.  Other major intracranial vascular flow voids are preserved.   No abnormal enhancement identified.  No ventriculomegaly. No acute intracranial hemorrhage identified.  No intracranial mass lesion. Negative visualized cervical spine.  Normal bone marrow signal. Negative pituitary. Visualized orbit soft tissues are within normal limits.  Small fluid levels in both maxillary sinuses.  Mastoids are clear.  Negative scalp soft tissues.  IMPRESSION: 1.  Small acute left PCA infarct involving the left occipital lobe/cuneus.  No mass effect or hemorrhage. 2.  Left ICA occlusion or slow flow, likely chronic in the absence of left anterior circulation findings. MRA findings are below.  MRA NECK WITHOUT AND WITH CONTRAST  Technique:  Angiographic images of the neck were obtained using MRA technique without and with intravenous contrast.  Carotid stenosis measurements (when applicable) are obtained utilizing NASCET criteria, using the distal internal carotid diameter as the denominator.  Findings:  Precontrast time-of-flight images reveal antegrade flow in both common carotid and vertebral arteries.  There is no antegrade flow signal evident in  the cervical left ICA.  Antegrade signal continues in the right ICA and both vertebral arteries.  Following contrast, bovine arch configuration is noted.  Common carotid artery origins are within normal limits.  The right common carotid artery is within normal limits.  The right carotid bifurcation is mildly irregular.  Filling defect in the posterior right ICA origin and bulb is compatible with atherosclerotic plaque.  Subsequent stenosis is less than 50 % with respect to the distal  vessel.  The cervical right ICA otherwise is normal.  Mildly tortuous left common carotid artery which otherwise appears within normal limits.  There is little irregularity at the left carotid bifurcation.  The left ICA origin appears patent but abruptly occludes 9 mm later at the level of the distal bulb (series 50 image 6).  Left ECA remains patent.  Both vertebral artery origins are within normal limits, the right is less well visualized.  The vertebral arteries are codominant and within normal limits throughout the neck.  IMPRESSION: 1.  Abrupt occlusion of the left ICA just beyond its origin. 2.  Mild atherosclerosis at the right carotid bifurcation without significant right ICA stenosis. 3.  Negative cervical vertebral arteries. 4.  Intracranial findings are below.  MRA HEAD WITHOUT CONTRAST  Technique: Angiographic images of the Circle of Willis were obtained using MRA technique without  intravenous contrast.  Findings:  Antegrade flow in the posterior circulation with codominant distal vertebral arteries.  The right PICA is patent. There is a dominant appearing left AICA.  The distal left vertebral artery is within normal limits.  There is moderate to severe irregularity at the right vertebrobasilar junction and continuing into the proximal and mid basilar artery. These time-of-flight images suggest mild to moderate basilar stenosis, but the same basilar artery segment visualized post contrast on the above MRA appears without significant stenosis. The distal basilar is patent and within normal limits.  Superior cerebellar arteries and PCA origins are within normal limits. There is moderate irregularity of the bilateral PCA branches. Stenosis appears worst in the right P2 segment.  Distal PCA flow appears relatively symmetric.  Both posterior communicating arteries are present, the right is diminutive.  No antegrade flow in the left ICA siphon.  Flow at the left ICA terminus is reconstituted probably from the  left posterior communicating artery.  Antegrade flow in the right ICA siphon.  Cavernous segment irregularity without right ICA stenosis.  Patent right ICA terminus.  Decreased left ACA A1 segment flow.  Normal right ACA.  Beyond the anterior communicating artery ACA branch flow appears symmetric. Normal visualized right MCA branches.  Mildly attenuated flow in the left MCA M1 segment.  Mild to moderately attenuated left MCA branch flow compared the right, more so in the anterior sylvian division.  IMPRESSION: 1.  Occluded ICA siphon.  Reconstituted flow at the left ICA terminus probably from the posterior communicating artery.  Mildly attenuated flow in the left MCA territory. 2.  Atherosclerosis and stenosis at the right vertebrobasilar junction, but these intracranial MRA images likely overestimate the degree of proximal basilar stenosis - the post contrast images of the basilar artery on the neck MRA portion do not indicate any proximal basilar stenosis.  Original Report Authenticated By: Harley Hallmark, M.D.   US Carotid Duplex Bilateral  08/18/2011  *RADIOLOGY REPORT*  Clinical Data: Left PCA infarct.  BILATERAL CAROTID DUPLEX ULTRASOUND  Technique: Wallace Cullens scale imaging, color Doppler and duplex ultrasound was performed of bilateral carotid and vertebral arteries in the  neck.  Comparison:  None  Criteria:  Quantification of carotid stenosis is based on velocity parameters that correlate the residual internal carotid diameter with NASCET-based stenosis levels, using the diameter of the distal internal carotid lumen as the denominator for stenosis measurement.  The following velocity measurements were obtained:                   PEAK SYSTOLIC/END DIASTOLIC RIGHT ICA:                        107/49cm/sec CCA:                        107/22cm/sec SYSTOLIC ICA/CCA RATIO:     1.0 DIASTOLIC ICA/CCA RATIO:    2.28 ECA:                        132cm/sec  LEFT ICA:                        18/2cm/sec CCA:                         91/9cm/sec SYSTOLIC ICA/CCA RATIO:     0.19 DIASTOLIC ICA/CCA RATIO:    0.19 ECA:                        58cm/sec  Findings:  RIGHT CAROTID ARTERY: Moderate soft plaque throughout the distal common carotid artery, carotid bulb, ICA and ECA without flow- limiting stenosis, less than 50%.  RIGHT VERTEBRAL ARTERY:  Antegrade flow.  LEFT CAROTID ARTERY: Extensive plaque formation in the carotid bulb and ICA.  Subtotal occlusion of the left ICA with only a slow trickle of flow.  LEFT VERTEBRAL ARTERY:  Antegrade flow.  IMPRESSION: Critical/subtotal occlusion of the left ICA.  Moderate disease in the right ICA without flow limiting stenosis.  Original Report Authenticated By: Cyndie Chime, M.D.    Medications: Scheduled Meds:    . acetaZOLAMIDE  500 mg Intravenous Once  . aspirin EC  325 mg Oral Daily  . insulin aspart  0-15 Units Subcutaneous TID WC  . isosorbide mononitrate  30 mg Oral Daily  . metoprolol  50 mg Oral BID  . simvastatin  40 mg Oral Daily  . sodium chloride  3 mL Intravenous Q12H  . warfarin  10 mg Oral ONCE-1800  . warfarin  15 mg Oral ONCE-1800  . warfarin  15 mg Oral ONCE-1800   Continuous Infusions:    . heparin 1,800 Units/hr (08/20/11 1136)     Assessment/Plan:  Principal Problem:  CVA (cerebral infarction)   Active Problems:  DIAB W/O COMP TYPE II/UNS NOT STATED UNCNTRL: Continue insulin   DYSLIPIDEMIA: Continue simvastatin   HTN: - Controlled on metoprolol and Imdur   Atrial fibrillation : rate controlled, on heparin and Coumadin. Patient follows Dr. Dietrich Pates [facility] in [facility]), discussed in detail the Dr. Antoine Poche from cardiology over the phone, he recommended that patient can be discharged tomorrow with Lovenox and Coumadin and followup with Dr. Dietrich Pates for the Coumadin clinic next week.  Left Carotid occlusion S/p angiogram 08/19/11 - plan for medical therapy only using heparin and Coumadin, INR 1.22 today.    Disposition: DC tomorrow with case manager assistance for the Lovenox. Discussed with the patient in detail and is agreeable with the plan.  LOS: 4 days   RAI,RIPUDEEP 08/21/2011, 1:21 PM

## 2011-08-21 NOTE — Progress Notes (Signed)
ANTICOAGULATION CONSULT NOTE - Initial Consult  Pharmacy Consult for Coumadin and Heparin Indication: atrial fibrillation/new onset stroke 2/2 L internal carotid artery occlusion  Assessment: 73 yom with h/o Afib and new onset stroke, s/p cerebral angiogram, for Heparin bridge to coumadin. Patient Heparin level is now therapeutic. INR is still subtherapeutic. Per [facility] patient on 15mg  coumadin at home.   Goal of Therapy:  Heparin level 0.3-0.5 INR 2-3   Plan:  - Continue Heparin at current rate (1800 units/hr)  and recheck level in am.  - Coumadin 15 mg po x1 dose tonight at 1800pm - f/u INR in am   Allergies  Allergen Reactions  . Other     Mayonnaise  . Penicillins Rash    Patient Measurements: Height: 6\' 1"  (185.4 cm) Weight: 267 lb 11.2 oz (121.428 kg) IBW/kg (Calculated) : 79.9   Vital Signs: Temp: 98 F (36.7 C) (12/16 0600) BP: 117/77 mmHg (12/16 0600) Pulse Rate: 74  (12/16 0600)  Labs:  Basename 08/21/11 0612 08/20/11 0600 08/20/11 0024 08/19/11 0123  HGB -- 15.5 -- 15.3  HCT -- 44.8 -- 44.0  PLT -- 231 -- 217  APTT -- -- -- --  LABPROT 15.7* 15.2 -- --  INR 1.22 1.18 -- --  HEPARINUNFRC 0.38 0.42 0.20* --  CREATININE -- -- -- --  CKTOTAL -- -- -- --  CKMB -- -- -- --  TROPONINI -- -- -- --   Estimated Creatinine Clearance: 100.8 ml/min (by C-G formula based on Cr of 1.13).  Medical History: Past Medical History  Diagnosis Date  . Hyperlipidemia   . Gilbert's syndrome   . Vertigo   . Diabetes mellitus     diet controlled  . Angina   . Hypertension   . Atrial fibrillation   . Atrial flutter 2007    h/o  . Stroke ~08/17/11    "lost vision left eye"  . Blood dyscrasia        Janace Litten, PharmD [phone] 08/21/2011,7:42 AM

## 2011-08-22 LAB — PROTIME-INR
INR: 1.57 — ABNORMAL HIGH (ref 0.00–1.49)
Prothrombin Time: 19.1 seconds — ABNORMAL HIGH (ref 11.6–15.2)

## 2011-08-22 LAB — GLUCOSE, CAPILLARY
Glucose-Capillary: 96 mg/dL (ref 70–99)
Glucose-Capillary: 86 mg/dL (ref 70–99)

## 2011-08-22 MED ORDER — WARFARIN SODIUM 7.5 MG PO TABS
15.0000 mg | ORAL_TABLET | Freq: Once | ORAL | Status: AC
  Administered 2011-08-22: 15 mg via ORAL
  Filled 2011-08-22: qty 2

## 2011-08-22 MED ORDER — STROKE: EARLY STAGES OF RECOVERY BOOK
Freq: Once | Status: DC
  Filled 2011-08-22: qty 1

## 2011-08-22 MED ORDER — ENOXAPARIN (LOVENOX) PATIENT EDUCATION KIT
PACK | Freq: Once | Status: DC
  Filled 2011-08-22: qty 1

## 2011-08-22 MED ORDER — ENOXAPARIN SODIUM 120 MG/0.8ML ~~LOC~~ SOLN
120.0000 mg | Freq: Two times a day (BID) | SUBCUTANEOUS | Status: DC
Start: 2011-08-22 — End: 2011-08-22
  Administered 2011-08-22: 120 mg via SUBCUTANEOUS
  Filled 2011-08-22 (×2): qty 0.8

## 2011-08-22 MED ORDER — ENOXAPARIN SODIUM 150 MG/ML ~~LOC~~ SOLN: 120.0000 mg | Freq: Two times a day (BID) | SUBCUTANEOUS | Status: DC

## 2011-08-22 MED ORDER — ASPIRIN 325 MG PO TBEC: 325.0000 mg | DELAYED_RELEASE_TABLET | Freq: Every day | ORAL | Status: AC

## 2011-08-22 MED ORDER — ISOSORBIDE MONONITRATE ER 30 MG PO TB24: 30.0000 mg | ORAL_TABLET | Freq: Every day | ORAL | Status: DC

## 2011-08-22 NOTE — Progress Notes (Signed)
Reviewed discharge instructions with patient and he stated his understanding.  Reviewed  lovenox injections and when to give medication, how to waste medication in syringe.  Patient stated his wife was knowledeable about giving injections and would give upon discharge.  Reviewed stroke booklet, heart healthy low sodium diet, when to call md and when to call 911, handouts given.  Also discussed smoking cessation and free counseling available, hand out given.  [name]

## 2011-08-22 NOTE — Progress Notes (Signed)
Utilization Review Completed.[name] T12/17/2012   

## 2011-08-22 NOTE — Progress Notes (Signed)
ANTICOAGULATION CONSULT NOTE - Follow Up Consult  Pharmacy Consult for Heparin and Warfarin Indication: hx afib; new onset cva 2/2 L internal carotid artery occlusion  Allergies  Allergen Reactions  . Other     Mayonnaise  . Penicillins Rash    Patient Measurements: Height: 6\' 1"  (185.4 cm) Weight: 267 lb 11.2 oz (121.428 kg) IBW/kg (Calculated) : 79.9  Heparin Dosing Weight: 106.3 kg  Vital Signs: Temp: 97.8 F (36.6 C) (12/17 0600) BP: 129/89 mmHg (12/17 0600) Pulse Rate: 80  (12/17 0600)  Labs:  Basename 08/22/11 0600 08/21/11 0612 08/20/11 0600  HGB -- -- 15.5  HCT -- -- 44.8  PLT -- -- 231  APTT -- -- --  LABPROT 19.1* 15.7* 15.2  INR 1.57* 1.22 1.18  HEPARINUNFRC 0.58 0.38 0.42  CREATININE -- -- --  CKTOTAL -- -- --  CKMB -- -- --  TROPONINI -- -- --   Estimated Creatinine Clearance: 100.8 ml/min (by C-G formula based on Cr of 1.13).   Medications:  Scheduled:    . acetaZOLAMIDE  500 mg Intravenous Once  . aspirin EC  325 mg Oral Daily  . insulin aspart  0-15 Units Subcutaneous TID WC  . isosorbide mononitrate  30 mg Oral Daily  . metoprolol  50 mg Oral BID  . simvastatin  40 mg Oral Daily  . sodium chloride  3 mL Intravenous Q12H  . warfarin  15 mg Oral ONCE-1800    Assessment: 55 yo M with h/o Afib and new onset CVA, s/p crebral angiogram.  Continues on heparin as bridge to therapeutic INR with Warfarin.  Heparin level above goal.  INR trending up on home Warfarin dose.  Noted plans for d/c home with Lovenox bridging until INR therapeutic.  Goal of Therapy:  INR 2-3 Heparin level 0.3-0.7 units/ml   Plan:  Will reduce heparin rate to 1650 units/hr.   Coumadin 15mg  PO x 1 tonight.  In anticipation of discharge, would recommend treatment dose Lovenox of 1mg /kg q12h (=120mg  SQ Q12h) for 6 doses.  Anticipate INR will be therapeutic in the next 3 days if patient continues on home 15mg  daily regimen.  [employer], Pharm.D., BCPS Clinical  Pharmacist Pager [phone]  08/22/2011,9:39 AM

## 2011-08-22 NOTE — Discharge Summary (Signed)
Physician Discharge Summary  Patient ID: [name] MRN: [id number] DOB/AGE: 09-28-1955 55 y.o.  Admit date: 08/17/2011 Discharge date: 08/22/2011  Primary Care Physician:  [name], MD, MD  Discharge Diagnoses:     .CVA (cerebral infarction)/ small acute left PCA infarct involving the left occipital lobe  .Carotid stenosis, left/ occlusion  .Atrial fibrillation .DIAB W/O COMP TYPE II/UNS NOT STATED UNCNTRL .DYSLIPIDEMIA  Consults:  Vascular surgery (Dr. Edilia Bo)                     Cardiology(Dr. Antoine Poche) over the phone   Discharge Medications: Current Discharge Medication List    START taking these medications   Details  aspirin EC 325 MG EC tablet Take 1 tablet (325 mg total) by mouth daily. Qty: 30 tablet, Refills: 3    enoxaparin (LOVENOX) 150 MG/ML injection Inject 0.8 mLs (120 mg total) into the skin every 12 (twelve) hours. DC when INR >2 Qty: 6 mL, Refills: 0    isosorbide mononitrate (IMDUR) 30 MG 24 hr tablet Take 1 tablet (30 mg total) by mouth daily. Qty: 30 tablet, Refills: 3      CONTINUE these medications which have NOT CHANGED   Details  metoprolol (TOPROL-XL) 50 MG 24 hr tablet Take 1 tablet (50 mg total) by mouth 2 (two) times daily. Qty: 90 tablet, Refills: 4   Associated Diagnoses: Atrial fibrillation    simvastatin (ZOCOR) 20 MG tablet Take 20 mg by mouth at bedtime.      warfarin (COUMADIN) 10 MG tablet Take 15 mg by mouth daily. As directed by Anticoagulation clinic.         Brief H and P: For complete details please refer to admission H and P, but in brief patient is a 55 year old [place] American male, with history of atrial fibrillation on Coumadin, dyslipidemia, diabetes mellitus presented to the  emergency room with acute loss of left ocular vision. As per the patient, he was in his usual state of health until a day before when he was going to work and started having loss of lateral field of vision of his right eye which lasted for  a few minutes and resolved, subsequently he felt fine during the rest of the day but when he woke up on the morning of admission, he was unable to see from his left eye. Patient informed that he is compliant to all his meds but had not taken Coumadin for last few days. In the ED he was not given t-PA as he was beyond the therapeutic window. Patient was admitted and [facility] and subsequently was seen by neurology (Dr. Gerilyn Pilgrim) who recommended patient transferred to [facility] for vascular surgery evaluation   Hospital Course:  CVA (cerebral infarction): Also see above in the brief H&P for details at [facility] admission. Neurological evaluation revealed slugging left pupil. MRI/ MRA showed small left PCA infarct with occlusion fo left ICA at the origin. Carotid doppler done showed near occlusion fo left ICA with trickle of flow. Patient was evaluated by neurology service at [facility] and was transferred to [facility] for vascular surgery evaluation and further management of stroke. Patient has not recovered any vision on left eye since admission, he otherwise remained hemodynamically stable. He was started on aspirin and heparin drip. Vascular surgery consultation was obtained and patient was followed by Dr. Waverly Ferrari. Patient underwent cerebral angiogram which showed occluded left internal carotid artery and recommended medical management including Coumadin. Patient  was placed on Lovenox at the time of discharge with Coumadin for the bridge. Patient follows Dr. Dietrich Pates [facility] cardiology in [facility] discussed in detail the Dr. Antoine Poche from cardiology over the phone, he recommended that patient can be discharged tomorrow with Lovenox and Coumadin and followup with Dr. Dietrich Pates for the Coumadin clinic on 08/25/2011.  DIAB W/O COMP TYPE II/UNS NOT STATED UNCNTRL: Continue insulin   DYSLIPIDEMIA: Continue simvastatin   HTN: - Controlled on  metoprolol and Imdur   Atrial fibrillation : rate controlled, Coumadin.   Day of Discharge BP 136/91  Pulse 87  Temp(Src) 98.4 F (36.9 C) (Oral)  Resp 20  Ht 6\' 1"  (1.854 m)  Wt 121.428 kg (267 lb 11.2 oz)  BMI 35.32 kg/m2  SpO2 97%  Physical Exam: General: Alert and awake oriented x3 not in any acute distress. HEENT: anicteric sclera, pupils reactive to light and accommodation CVS: S1-S2 clear no murmur rubs or gallops Chest: clear to auscultation bilaterally, no wheezing rales or rhonchi Abdomen: soft nontender, nondistended, normal bowel sounds, no organomegaly Extremities: no cyanosis, clubbing or edema noted bilaterally Neuro: Cranial nerves II-XII intact, no focal neurological deficits   The results of significant diagnostics from this hospitalization (including imaging, microbiology, ancillary and laboratory) are listed below for reference.    LAB RESULTS: Basic Metabolic Panel:  Lab 08/17/11 1610  NA 137  K 4.0  CL 106  CO2 22  GLUCOSE 94  BUN 18  CREATININE 1.13  CALCIUM 9.8  MG --  PHOS --  CBC:  Lab 08/20/11 0600 08/19/11 0123 08/17/11 1231  WBC 8.1 9.0 --  NEUTROABS -- -- 4.8  HGB 15.5 15.3 --  HCT 44.8 44.0 --  MCV 88.9 -- --  PLT 231 217 --   CBG:  Lab 08/22/11 1142 08/22/11 0744  GLUCAP 86 96    Significant Diagnostic Studies:  Ct Head Wo Contrast  08/17/2011  *RADIOLOGY REPORT*  Clinical Data: Visual field changes in the left thigh.  CT HEAD WITHOUT CONTRAST  Technique:  Contiguous axial images were obtained from the base of the skull through the vertex without contrast.  Comparison: 04/21/2010  Findings: No acute intracranial abnormality.  Specifically, no hemorrhage, hydrocephalus, mass lesion, acute infarction, or significant intracranial injury.  No acute calvarial abnormality.  Short air fluid levels are seen in the maxillary sinuses bilaterally, a nonspecific finding.  This may reflect acute sinusitis.  IMPRESSION: No acute  intracranial abnormality.  Small air-fluid levels in the maxillary sinuses bilaterally.  Original Report Authenticated By: Cyndie Chime, M.D.   Mr Angiogram Head Wo Contrast  08/17/2011  *RADIOLOGY REPORT*  Clinical Data:  55 year old male with bilateral visual loss times 2 days.  Bilateral hand numbness.  Contrast: 20mL MULTIHANCE GADOBENATE DIMEGLUMINE 529 MG/ML IV SOLN  Comparison: Head CTs 08/17/2011, 04/21/2010.  MRI HEAD WITHOUT AND WITH CONTRAST  Technique: Multiplanar, multiecho pulse sequences of the brain and surrounding structures were obtained according to standard protocol without and with intravenous contrast.  Findings:  Study is intermittently degraded by motion artifact despite repeated imaging attempts.  There is a smaller cortically based restricted diffusion in the left occipital lobe, cephalad to the calcarine fissure.  There is associated T2 and FLAIR hyperintensity.  No mass effect or hemorrhage.  Major intracranial vascular flow voids absent flow void in the left ICA siphon and visualized distal left cervical ICA.  See MRA findings below.  Other major intracranial vascular flow voids are preserved.   No abnormal enhancement  identified.  No ventriculomegaly. No acute intracranial hemorrhage identified.  No intracranial mass lesion. Negative visualized cervical spine.  Normal bone marrow signal. Negative pituitary. Visualized orbit soft tissues are within normal limits.  Small fluid levels in both maxillary sinuses.  Mastoids are clear.  Negative scalp soft tissues.  IMPRESSION: 1.  Small acute left PCA infarct involving the left occipital lobe/cuneus.  No mass effect or hemorrhage. 2.  Left ICA occlusion or slow flow, likely chronic in the absence of left anterior circulation findings. MRA findings are below.  MRA NECK WITHOUT AND WITH CONTRAST  Technique:  Angiographic images of the neck were obtained using MRA technique without and with intravenous contrast.  Carotid stenosis  measurements (when applicable) are obtained utilizing NASCET criteria, using the distal internal carotid diameter as the denominator.  Findings:  Precontrast time-of-flight images reveal antegrade flow in both common carotid and vertebral arteries.  There is no antegrade flow signal evident in the cervical left ICA.  Antegrade signal continues in the right ICA and both vertebral arteries.  Following contrast, bovine arch configuration is noted.  Common carotid artery origins are within normal limits.  The right common carotid artery is within normal limits.  The right carotid bifurcation is mildly irregular.  Filling defect in the posterior right ICA origin and bulb is compatible with atherosclerotic plaque.  Subsequent stenosis is less than 50 % with respect to the distal vessel.  The cervical right ICA otherwise is normal.  Mildly tortuous left common carotid artery which otherwise appears within normal limits.  There is little irregularity at the left carotid bifurcation.  The left ICA origin appears patent but abruptly occludes 9 mm later at the level of the distal bulb (series 50 image 6).  Left ECA remains patent.  Both vertebral artery origins are within normal limits, the right is less well visualized.  The vertebral arteries are codominant and within normal limits throughout the neck.  IMPRESSION: 1.  Abrupt occlusion of the left ICA just beyond its origin. 2.  Mild atherosclerosis at the right carotid bifurcation without significant right ICA stenosis. 3.  Negative cervical vertebral arteries. 4.  Intracranial findings are below.  MRA HEAD WITHOUT CONTRAST  Technique: Angiographic images of the Circle of Willis were obtained using MRA technique without  intravenous contrast.  Findings:  Antegrade flow in the posterior circulation with codominant distal vertebral arteries.  The right PICA is patent. There is a dominant appearing left AICA.  The distal left vertebral artery is within normal limits.  There is  moderate to severe irregularity at the right vertebrobasilar junction and continuing into the proximal and mid basilar artery. These time-of-flight images suggest mild to moderate basilar stenosis, but the same basilar artery segment visualized post contrast on the above MRA appears without significant stenosis. The distal basilar is patent and within normal limits.  Superior cerebellar arteries and PCA origins are within normal limits. There is moderate irregularity of the bilateral PCA branches. Stenosis appears worst in the right P2 segment.  Distal PCA flow appears relatively symmetric.  Both posterior communicating arteries are present, the right is diminutive.  No antegrade flow in the left ICA siphon.  Flow at the left ICA terminus is reconstituted probably from the left posterior communicating artery.  Antegrade flow in the right ICA siphon.  Cavernous segment irregularity without right ICA stenosis.  Patent right ICA terminus.  Decreased left ACA A1 segment flow.  Normal right ACA.  Beyond the anterior communicating artery ACA branch  flow appears symmetric. Normal visualized right MCA branches.  Mildly attenuated flow in the left MCA M1 segment.  Mild to moderately attenuated left MCA branch flow compared the right, more so in the anterior sylvian division.  IMPRESSION: 1.  Occluded ICA siphon.  Reconstituted flow at the left ICA terminus probably from the posterior communicating artery.  Mildly attenuated flow in the left MCA territory. 2.  Atherosclerosis and stenosis at the right vertebrobasilar junction, but these intracranial MRA images likely overestimate the degree of proximal basilar stenosis - the post contrast images of the basilar artery on the neck MRA portion do not indicate any proximal basilar stenosis.  Original Report Authenticated By: Harley Hallmark, M.D.   Mr Angiogram Neck W Wo Contrast  08/17/2011  *RADIOLOGY REPORT*  Clinical Data:  55 year old male with bilateral visual loss times  2 days.  Bilateral hand numbness.  Contrast: 20mL MULTIHANCE GADOBENATE DIMEGLUMINE 529 MG/ML IV SOLN  Comparison: Head CTs 08/17/2011, 04/21/2010.  MRI HEAD WITHOUT AND WITH CONTRAST  Technique: Multiplanar, multiecho pulse sequences of the brain and surrounding structures were obtained according to standard protocol without and with intravenous contrast.  Findings:  Study is intermittently degraded by motion artifact despite repeated imaging attempts.  There is a smaller cortically based restricted diffusion in the left occipital lobe, cephalad to the calcarine fissure.  There is associated T2 and FLAIR hyperintensity.  No mass effect or hemorrhage.  Major intracranial vascular flow voids absent flow void in the left ICA siphon and visualized distal left cervical ICA.  See MRA findings below.  Other major intracranial vascular flow voids are preserved.   No abnormal enhancement identified.  No ventriculomegaly. No acute intracranial hemorrhage identified.  No intracranial mass lesion. Negative visualized cervical spine.  Normal bone marrow signal. Negative pituitary. Visualized orbit soft tissues are within normal limits.  Small fluid levels in both maxillary sinuses.  Mastoids are clear.  Negative scalp soft tissues.  IMPRESSION: 1.  Small acute left PCA infarct involving the left occipital lobe/cuneus.  No mass effect or hemorrhage. 2.  Left ICA occlusion or slow flow, likely chronic in the absence of left anterior circulation findings. MRA findings are below.  MRA NECK WITHOUT AND WITH CONTRAST  Technique:  Angiographic images of the neck were obtained using MRA technique without and with intravenous contrast.  Carotid stenosis measurements (when applicable) are obtained utilizing NASCET criteria, using the distal internal carotid diameter as the denominator.  Findings:  Precontrast time-of-flight images reveal antegrade flow in both common carotid and vertebral arteries.  There is no antegrade flow signal  evident in the cervical left ICA.  Antegrade signal continues in the right ICA and both vertebral arteries.  Following contrast, bovine arch configuration is noted.  Common carotid artery origins are within normal limits.  The right common carotid artery is within normal limits.  The right carotid bifurcation is mildly irregular.  Filling defect in the posterior right ICA origin and bulb is compatible with atherosclerotic plaque.  Subsequent stenosis is less than 50 % with respect to the distal vessel.  The cervical right ICA otherwise is normal.  Mildly tortuous left common carotid artery which otherwise appears within normal limits.  There is little irregularity at the left carotid bifurcation.  The left ICA origin appears patent but abruptly occludes 9 mm later at the level of the distal bulb (series 50 image 6).  Left ECA remains patent.  Both vertebral artery origins are within normal limits, the right is less  well visualized.  The vertebral arteries are codominant and within normal limits throughout the neck.  IMPRESSION: 1.  Abrupt occlusion of the left ICA just beyond its origin. 2.  Mild atherosclerosis at the right carotid bifurcation without significant right ICA stenosis. 3.  Negative cervical vertebral arteries. 4.  Intracranial findings are below.  MRA HEAD WITHOUT CONTRAST  Technique: Angiographic images of the Circle of Willis were obtained using MRA technique without  intravenous contrast.  Findings:  Antegrade flow in the posterior circulation with codominant distal vertebral arteries.  The right PICA is patent. There is a dominant appearing left AICA.  The distal left vertebral artery is within normal limits.  There is moderate to severe irregularity at the right vertebrobasilar junction and continuing into the proximal and mid basilar artery. These time-of-flight images suggest mild to moderate basilar stenosis, but the same basilar artery segment visualized post contrast on the above MRA appears  without significant stenosis. The distal basilar is patent and within normal limits.  Superior cerebellar arteries and PCA origins are within normal limits. There is moderate irregularity of the bilateral PCA branches. Stenosis appears worst in the right P2 segment.  Distal PCA flow appears relatively symmetric.  Both posterior communicating arteries are present, the right is diminutive.  No antegrade flow in the left ICA siphon.  Flow at the left ICA terminus is reconstituted probably from the left posterior communicating artery.  Antegrade flow in the right ICA siphon.  Cavernous segment irregularity without right ICA stenosis.  Patent right ICA terminus.  Decreased left ACA A1 segment flow.  Normal right ACA.  Beyond the anterior communicating artery ACA branch flow appears symmetric. Normal visualized right MCA branches.  Mildly attenuated flow in the left MCA M1 segment.  Mild to moderately attenuated left MCA branch flow compared the right, more so in the anterior sylvian division.  IMPRESSION: 1.  Occluded ICA siphon.  Reconstituted flow at the left ICA terminus probably from the posterior communicating artery.  Mildly attenuated flow in the left MCA territory. 2.  Atherosclerosis and stenosis at the right vertebrobasilar junction, but these intracranial MRA images likely overestimate the degree of proximal basilar stenosis - the post contrast images of the basilar artery on the neck MRA portion do not indicate any proximal basilar stenosis.  Original Report Authenticated By: Harley Hallmark, M.D.   Mr Laqueta Jean Wo Contrast  08/17/2011  *RADIOLOGY REPORT*  Clinical Data:  55 year old male with bilateral visual loss times 2 days.  Bilateral hand numbness.  Contrast: 20mL MULTIHANCE GADOBENATE DIMEGLUMINE 529 MG/ML IV SOLN  Comparison: Head CTs 08/17/2011, 04/21/2010.  MRI HEAD WITHOUT AND WITH CONTRAST  Technique: Multiplanar, multiecho pulse sequences of the brain and surrounding structures were obtained  according to standard protocol without and with intravenous contrast.  Findings:  Study is intermittently degraded by motion artifact despite repeated imaging attempts.  There is a smaller cortically based restricted diffusion in the left occipital lobe, cephalad to the calcarine fissure.  There is associated T2 and FLAIR hyperintensity.  No mass effect or hemorrhage.  Major intracranial vascular flow voids absent flow void in the left ICA siphon and visualized distal left cervical ICA.  See MRA findings below.  Other major intracranial vascular flow voids are preserved.   No abnormal enhancement identified.  No ventriculomegaly. No acute intracranial hemorrhage identified.  No intracranial mass lesion. Negative visualized cervical spine.  Normal bone marrow signal. Negative pituitary. Visualized orbit soft tissues are within normal limits.  Small  fluid levels in both maxillary sinuses.  Mastoids are clear.  Negative scalp soft tissues.  IMPRESSION: 1.  Small acute left PCA infarct involving the left occipital lobe/cuneus.  No mass effect or hemorrhage. 2.  Left ICA occlusion or slow flow, likely chronic in the absence of left anterior circulation findings. MRA findings are below.  MRA NECK WITHOUT AND WITH CONTRAST  Technique:  Angiographic images of the neck were obtained using MRA technique without and with intravenous contrast.  Carotid stenosis measurements (when applicable) are obtained utilizing NASCET criteria, using the distal internal carotid diameter as the denominator.  Findings:  Precontrast time-of-flight images reveal antegrade flow in both common carotid and vertebral arteries.  There is no antegrade flow signal evident in the cervical left ICA.  Antegrade signal continues in the right ICA and both vertebral arteries.  Following contrast, bovine arch configuration is noted.  Common carotid artery origins are within normal limits.  The right common carotid artery is within normal limits.  The right  carotid bifurcation is mildly irregular.  Filling defect in the posterior right ICA origin and bulb is compatible with atherosclerotic plaque.  Subsequent stenosis is less than 50 % with respect to the distal vessel.  The cervical right ICA otherwise is normal.  Mildly tortuous left common carotid artery which otherwise appears within normal limits.  There is little irregularity at the left carotid bifurcation.  The left ICA origin appears patent but abruptly occludes 9 mm later at the level of the distal bulb (series 50 image 6).  Left ECA remains patent.  Both vertebral artery origins are within normal limits, the right is less well visualized.  The vertebral arteries are codominant and within normal limits throughout the neck.  IMPRESSION: 1.  Abrupt occlusion of the left ICA just beyond its origin. 2.  Mild atherosclerosis at the right carotid bifurcation without significant right ICA stenosis. 3.  Negative cervical vertebral arteries. 4.  Intracranial findings are below.  MRA HEAD WITHOUT CONTRAST  Technique: Angiographic images of the Circle of Willis were obtained using MRA technique without  intravenous contrast.  Findings:  Antegrade flow in the posterior circulation with codominant distal vertebral arteries.  The right PICA is patent. There is a dominant appearing left AICA.  The distal left vertebral artery is within normal limits.  There is moderate to severe irregularity at the right vertebrobasilar junction and continuing into the proximal and mid basilar artery. These time-of-flight images suggest mild to moderate basilar stenosis, but the same basilar artery segment visualized post contrast on the above MRA appears without significant stenosis. The distal basilar is patent and within normal limits.  Superior cerebellar arteries and PCA origins are within normal limits. There is moderate irregularity of the bilateral PCA branches. Stenosis appears worst in the right P2 segment.  Distal PCA flow  appears relatively symmetric.  Both posterior communicating arteries are present, the right is diminutive.  No antegrade flow in the left ICA siphon.  Flow at the left ICA terminus is reconstituted probably from the left posterior communicating artery.  Antegrade flow in the right ICA siphon.  Cavernous segment irregularity without right ICA stenosis.  Patent right ICA terminus.  Decreased left ACA A1 segment flow.  Normal right ACA.  Beyond the anterior communicating artery ACA branch flow appears symmetric. Normal visualized right MCA branches.  Mildly attenuated flow in the left MCA M1 segment.  Mild to moderately attenuated left MCA branch flow compared the right, more so in the anterior  sylvian division.  IMPRESSION: 1.  Occluded ICA siphon.  Reconstituted flow at the left ICA terminus probably from the posterior communicating artery.  Mildly attenuated flow in the left MCA territory. 2.  Atherosclerosis and stenosis at the right vertebrobasilar junction, but these intracranial MRA images likely overestimate the degree of proximal basilar stenosis - the post contrast images of the basilar artery on the neck MRA portion do not indicate any proximal basilar stenosis.  Original Report Authenticated By: Harley Hallmark, M.D.   US Carotid Duplex Bilateral  08/18/2011  *RADIOLOGY REPORT*  Clinical Data: Left PCA infarct.  BILATERAL CAROTID DUPLEX ULTRASOUND  Technique: Wallace Cullens scale imaging, color Doppler and duplex ultrasound was performed of bilateral carotid and vertebral arteries in the neck.  Comparison:  None  Criteria:  Quantification of carotid stenosis is based on velocity parameters that correlate the residual internal carotid diameter with NASCET-based stenosis levels, using the diameter of the distal internal carotid lumen as the denominator for stenosis measurement.  The following velocity measurements were obtained:                   PEAK SYSTOLIC/END DIASTOLIC RIGHT ICA:                         107/49cm/sec CCA:                        107/22cm/sec SYSTOLIC ICA/CCA RATIO:     1.0 DIASTOLIC ICA/CCA RATIO:    2.28 ECA:                        132cm/sec  LEFT ICA:                        18/2cm/sec CCA:                        91/9cm/sec SYSTOLIC ICA/CCA RATIO:     0.19 DIASTOLIC ICA/CCA RATIO:    0.19 ECA:                        58cm/sec  Findings:  RIGHT CAROTID ARTERY: Moderate soft plaque throughout the distal common carotid artery, carotid bulb, ICA and ECA without flow- limiting stenosis, less than 50%.  RIGHT VERTEBRAL ARTERY:  Antegrade flow.  LEFT CAROTID ARTERY: Extensive plaque formation in the carotid bulb and ICA.  Subtotal occlusion of the left ICA with only a slow trickle of flow.  LEFT VERTEBRAL ARTERY:  Antegrade flow.  IMPRESSION: Critical/subtotal occlusion of the left ICA.  Moderate disease in the right ICA without flow limiting stenosis.  Original Report Authenticated By: Cyndie Chime, M.D.     Disposition and Follow-up: Discharge Orders    Future Orders Please Complete By Expires   Diet - low sodium heart healthy      Increase activity slowly      Discharge instructions      Comments:   Please check PT/INR on Thursday, Dec 20th,12 [facility] (Dr Rothbart's office). Stop lovenox shots if INR between 2-3.       DISPOSITION: Home  DIET: Heart healthy  ACTIVITY: As tolerated  TESTS THAT NEED FOLLOW-UP He should be also referred to ophthalmology exam outpatient by the PCP.  DISCHARGE FOLLOW-UP Follow-up Information    Follow up with [name], MD. Make an appointment in 2 weeks.  Follow up with Hughes Bing, MD. Make an appointment in 4 days. (check PT/INR on Dec 20th, 2012)    Contact information:   [address] [place] [address] [phone]          Time spent on Discharge: 45 minutes  Signed: Lennie Vasco 08/22/2011, 3:18 PM

## 2011-08-24 ENCOUNTER — Other Ambulatory Visit: Payer: Self-pay | Admitting: Cardiology

## 2011-08-24 MED ORDER — SIMVASTATIN 20 MG PO TABS: 20.0000 mg | ORAL_TABLET | Freq: Every day | ORAL | Status: DC

## 2011-08-24 NOTE — Telephone Encounter (Signed)
Patient suffered a stroke last week.  Needs refill of Zocor.  Patient is taking Lovenox 120mg  inj.

## 2011-08-25 ENCOUNTER — Encounter: Payer: Self-pay | Admitting: Adult Health

## 2011-08-25 ENCOUNTER — Ambulatory Visit (INDEPENDENT_AMBULATORY_CARE_PROVIDER_SITE_OTHER): Payer: BC Managed Care – PPO | Admitting: *Deleted

## 2011-08-25 ENCOUNTER — Ambulatory Visit (INDEPENDENT_AMBULATORY_CARE_PROVIDER_SITE_OTHER): Payer: BC Managed Care – PPO | Admitting: Adult Health

## 2011-08-25 VITALS — BP 107/72 | HR 74 | Resp 18 | Ht 73.0 in | Wt 268.0 lb

## 2011-08-25 DIAGNOSIS — I4891 Unspecified atrial fibrillation: Secondary | ICD-10-CM

## 2011-08-25 DIAGNOSIS — I519 Heart disease, unspecified: Secondary | ICD-10-CM

## 2011-08-25 DIAGNOSIS — E785 Hyperlipidemia, unspecified: Secondary | ICD-10-CM

## 2011-08-25 DIAGNOSIS — Z7901 Long term (current) use of anticoagulants: Secondary | ICD-10-CM

## 2011-08-25 DIAGNOSIS — I255 Ischemic cardiomyopathy: Secondary | ICD-10-CM | POA: Insufficient documentation

## 2011-08-25 MED ORDER — FUROSEMIDE 20 MG PO TABS: 20.0000 mg | ORAL_TABLET | Freq: Every day | ORAL | Status: DC

## 2011-08-25 MED ORDER — WARFARIN SODIUM 10 MG PO TABS: 15.0000 mg | ORAL_TABLET | Freq: Every day | ORAL | Status: DC

## 2011-08-25 MED ORDER — ISOSORBIDE MONONITRATE ER 30 MG PO TB24: 30.0000 mg | ORAL_TABLET | Freq: Every day | ORAL | Status: DC

## 2011-08-25 MED ORDER — SIMVASTATIN 20 MG PO TABS: 20.0000 mg | ORAL_TABLET | Freq: Every day | ORAL | Status: DC

## 2011-08-25 MED ORDER — METOPROLOL SUCCINATE ER 50 MG PO TB24: 50.0000 mg | ORAL_TABLET | Freq: Two times a day (BID) | ORAL | Status: DC

## 2011-08-25 NOTE — Progress Notes (Signed)
HPI: [name] is a 55 y/o morbidly AAM patient of Dr.Rothbart who we are seeing on hospital follow-up. He unfortunately continues to miss multiple appointments and is medically noncompliant. 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 He has a history of AFib, but was not taking his medication. He had an echo during hospitalization which showed EF of 35%-40% with diffuse hypokinesis and LVH. He had a cerebral angiogram which demonstrated an occluded LICA. Medical management was recommended per Dr. Edilia Bo, vascular surgeon.     Since discharge, he has been taking his medications as directed. He has had his INR checked today in clinic and found to be 2.5 on coumadin 10 mg daily.  He has become more concerned about his health and states that he will.follow up regularly from now on. He has a son nearby who is taking an active interest in helping his father with medical needs.  Allergies  Allergen Reactions  . Other     Mayonnaise  . Penicillins Rash    Current Outpatient Prescriptions  Medication Sig Dispense Refill  . aspirin EC 325 MG EC tablet Take 1 tablet (325 mg total) by mouth daily.  30 tablet  3  . isosorbide mononitrate (IMDUR) 30 MG 24 hr tablet Take 1 tablet (30 mg total) by mouth daily.  30 tablet  3  . metoprolol (TOPROL-XL) 50 MG 24 hr tablet Take 1 tablet (50 mg total) by mouth 2 (two) times daily.  60 tablet  3  . simvastatin (ZOCOR) 20 MG tablet Take 1 tablet (20 mg total) by mouth at bedtime.  30 tablet  3  . warfarin (COUMADIN) 10 MG tablet Take 1.5 tablets (15 mg total) by mouth daily. As directed by Anticoagulation clinic.  45 tablet  2  . DISCONTD: warfarin (COUMADIN) 10 MG tablet Take 15 mg by mouth daily. As directed by Anticoagulation clinic.      . furosemide (LASIX) 20 MG tablet Take 1 tablet (20 mg total) by mouth daily.  30 tablet  3    Past Medical History  Diagnosis  Date  . Hyperlipidemia   . Gilbert's syndrome   . Vertigo   . Diabetes mellitus     diet controlled  . Angina   . Hypertension   . Atrial fibrillation   . Atrial flutter 2007    h/o  . Stroke ~08/17/11    "lost vision left eye"  . Blood dyscrasia     Past Surgical History  Procedure Date  . Achilles tendon repair 06/2002    Left  . Cardioversion 2007    WGN:FAOZHY of systems complete and found to be negative unless listed above PHYSICAL EXAM BP 107/72  Pulse 74  Resp 18  Ht 6\' 1"  (1.854 m)  Wt 268 lb (121.564 kg)  BMI 35.36 kg/m2  General: Well developed, well nourished, in no acute distress morbidly obese. Head: Eyes PERRLA, No xanthomas.   Normal cephalic and atramatic  Lungs: Clear bilaterally to auscultation and percussion. Heart: HRRR S1 S2, without MRG.  Pulses are 2+ & equal.            No carotid bruit. No JVD.  No abdominal bruits. No femoral bruits. Abdomen: Bowel sounds are positive, abdomen soft and non-tender without masses or  Hernia's noted. Msk:  Back normal, normal gait. Normal strength and tone for age. Extremities: No clubbing, cyanosis or  edema.  DP +1 Neuro: Alert and oriented X 3. Psych:  Good affect, responds appropriately  AVW:UJWJXB fib rate of 100 bpm. Nonspecific T-wave abnormality in the infero/lateral leads.  ASSESSMENT AND PLAN

## 2011-08-25 NOTE — Patient Instructions (Signed)
Your physician recommends that you weigh, daily, at the same time every day, and in the same amount of clothing. Please record your daily weights on the handout provided and bring it to your next appointment. Please bring your weight diary with you to next visit.  Your physician has recommended you make the following change in your medication: start taking Lasix (furosemide) 20 mg daily  Your provider recommends that you start a low odium diet, please see handout given to you at today's visit.  Your physician recommends that you schedule a follow-up appointment in: 1 month

## 2011-08-25 NOTE — Assessment & Plan Note (Signed)
He is currently compliant with his medicines. I am not sure how long this will last, as he has a longstanding history of noncompliance. I have advised him to take more responsibility for his health. He states that he would. HR is 74 on vitals with 100 bpm on EKG. Will continue to monitor this on a monthly basis for close follow-up of INR and assessment.

## 2011-08-25 NOTE — Assessment & Plan Note (Signed)
Follow-up labs will be ordered on next office visit.

## 2011-08-25 NOTE — Assessment & Plan Note (Signed)
This may be related to LVH and hypertensive heart disease, however cannot rule out ischemic component to this as he has multiple CVRF. Will plan a lexiscan myoview on next visit as one has not been completed in several years, since being seen by physician in [place] in 2009. I will provide lasix 20 mg daily to assist with fluid retention with low EF. I have discussed with him the importance of taking his medications with the hope of increasing his cardiac function, and the consequences of not doing so. Will have monthly follow-ups for the next 6 months to continue assessment of this.

## 2011-08-26 NOTE — Consult Note (Signed)
NAME:  [name], PACI                    ACCOUNT NO.:  MEDICAL RECORD NO.:  [id number]  LOCATION:                                 FACILITY:  PHYSICIAN:  Faren Florence K. Daymen Hassebrock, M.D.DATE OF BIRTH:  08/24/2011  DATE OF CONSULTATION: DATE OF DISCHARGE:                                CONSULTATION   CLINICAL HISTORY:  The patient with history of left cerebral hemispheric ischemic.  EXAMINATION:  Intracranial interpretation of bilateral carotid arteriograms.  The right common carotid arteriogram demonstrates the petrous, cavernous, and supraclinoid right ICA to be normal.  The right middle and the right anterior cerebral arteries opacified normally into the capillary and venous phases.  Simultaneous cross opacification via the anterior communicating artery, the left anterior cerebral artery, and the left middle cerebral artery is noted.  Suggestion of antegrade flow in the left middle cerebral artery is noted.  The left common carotid arteriogram demonstrates complete angiographic occlusion of the left internal carotid artery just distal to the bulb.  No significant intracranial reconstitution of the left internal carotid artery is noted from the external carotid artery branches.  The left external carotid artery branches are normal.  IMPRESSION: 1. Angiographically occluded left internal carotid artery just distal     to the bulb without significant distal reconstitution of the left     internal carotid artery intracranially from the external carotid     artery branches. 2. Significant cross opacification of the left anterior and the left     middle cerebral artery from the right carotid injection.          ______________________________ Grandville Silos Corliss Skains, M.D.     SKD/MEDQ  D:  08/23/2011  T:  08/24/2011  Job:  [id number]

## 2011-08-31 ENCOUNTER — Ambulatory Visit: Payer: BC Managed Care – PPO | Admitting: Adult Health

## 2011-09-05 ENCOUNTER — Ambulatory Visit (INDEPENDENT_AMBULATORY_CARE_PROVIDER_SITE_OTHER): Payer: BC Managed Care – PPO | Admitting: *Deleted

## 2011-09-05 DIAGNOSIS — I4891 Unspecified atrial fibrillation: Secondary | ICD-10-CM

## 2011-09-05 DIAGNOSIS — Z7901 Long term (current) use of anticoagulants: Secondary | ICD-10-CM

## 2011-09-26 ENCOUNTER — Encounter: Payer: Self-pay | Admitting: Adult Health

## 2011-09-26 ENCOUNTER — Ambulatory Visit (INDEPENDENT_AMBULATORY_CARE_PROVIDER_SITE_OTHER): Payer: BC Managed Care – PPO | Admitting: *Deleted

## 2011-09-26 ENCOUNTER — Ambulatory Visit (INDEPENDENT_AMBULATORY_CARE_PROVIDER_SITE_OTHER): Payer: BC Managed Care – PPO | Admitting: Adult Health

## 2011-09-26 DIAGNOSIS — I639 Cerebral infarction, unspecified: Secondary | ICD-10-CM

## 2011-09-26 DIAGNOSIS — I519 Heart disease, unspecified: Secondary | ICD-10-CM

## 2011-09-26 DIAGNOSIS — I6529 Occlusion and stenosis of unspecified carotid artery: Secondary | ICD-10-CM

## 2011-09-26 DIAGNOSIS — Z7901 Long term (current) use of anticoagulants: Secondary | ICD-10-CM | POA: Insufficient documentation

## 2011-09-26 DIAGNOSIS — I6522 Occlusion and stenosis of left carotid artery: Secondary | ICD-10-CM

## 2011-09-26 DIAGNOSIS — I4891 Unspecified atrial fibrillation: Secondary | ICD-10-CM

## 2011-09-26 LAB — POCT INR: INR: 3.8

## 2011-09-26 MED ORDER — SIMVASTATIN 20 MG PO TABS: 20.0000 mg | ORAL_TABLET | Freq: Every day | ORAL | Status: DC

## 2011-09-26 NOTE — Progress Notes (Signed)
   HPI: [name] is a 56 y/o patient of Dr. Dietrich Pates we are seeing for ongoing assessment and treatment of hypertension, atrial fibrillation, systolic dysfunction EF of 35%-40 % with history of acute CVA in December of 2012 with loss of vision in the left eye.Marland Kitchen Recommended medical management after arteriogram and assessment by Dr. Edilia Bo. He has been on coumadin and has followed up in our office for INR checks. He has been medically compliant and is now being careful concerning his diet. He is watching his salt intake. He has joined a Health visitor and wants to begin and exercise program. He is now motivated to be more engaged in his health maintenance. His only complaint is a headache with use of Imdur. He has recently bought a digital scale at home. He is resolved to put in paperwork for disability secondary to blindness and CM. He is discussing this with his HR department.  Allergies  Allergen Reactions  . Other     Mayonnaise  . Penicillins Rash    Current Outpatient Prescriptions  Medication Sig Dispense Refill  . aspirin 325 MG tablet Take 325 mg by mouth daily.      . furosemide (LASIX) 20 MG tablet Take 1 tablet (20 mg total) by mouth daily.  30 tablet  3  . isosorbide mononitrate (IMDUR) 30 MG 24 hr tablet Take 1 tablet (30 mg total) by mouth daily.  30 tablet  3  . metoprolol (TOPROL-XL) 50 MG 24 hr tablet Take 1 tablet (50 mg total) by mouth 2 (two) times daily.  60 tablet  3  . simvastatin (ZOCOR) 20 MG tablet Take 1 tablet (20 mg total) by mouth at bedtime.  30 tablet  3  . warfarin (COUMADIN) 10 MG tablet Take 1.5 tablets (15 mg total) by mouth daily. As directed by Anticoagulation clinic.  45 tablet  2    Past Medical History  Diagnosis Date  . Hyperlipidemia   . Gilbert's syndrome   . Vertigo   . Diabetes mellitus     diet controlled  . Angina   . Hypertension   . Atrial fibrillation   . Atrial flutter 2007    h/o  . Stroke ~08/17/11    "lost vision left eye"  .  Blood dyscrasia     Past Surgical History  Procedure Date  . Achilles tendon repair 06/2002    Left  . Cardioversion 2007    ZOX:WRUEAV of systems complete and found to be negative unless listed above PHYSICAL EXAM BP 119/82  Pulse 98  Ht 6\' 1"  (1.854 m)  Wt 271 lb (122.925 kg)  BMI 35.75 kg/m2  General: Well developed, well nourished, in no acute distress Head: Eyes PERRLA, No xanthomas.   Normal cephalic and atramatic  Lungs: Clear bilaterally to auscultation and percussion. Heart: HRIR S1 S2, without MRG.  Pulses are 2+ & equal.            No carotid bruit. No JVD.  No abdominal bruits. No femoral bruits. Abdomen: Bowel sounds are positive, abdomen soft and non-tender without masses or                  Hernia's noted. Msk:  Back normal, normal gait. Normal strength and tone for age. Extremities: No clubbing, cyanosis or edema.  DP +1 Neuro: Alert and oriented X 3. Psych:  Good affect, responds appropriately  ASSESSMENT AND PLAN

## 2011-09-26 NOTE — Patient Instructions (Signed)
Your physician recommends that you schedule a follow-up appointment in: 1 month. Your physician recommends that you continue on your current medications as directed. Please refer to the Current Medication list given to you today. 

## 2011-09-26 NOTE — Assessment & Plan Note (Signed)
He is taking more responsibility for his health status, and is anxious to exercise and lose wt. I have given him permission to move forward with increased activity, but to do so slowly. He is to work his way up to walking/excercising 1 hour daily, by starting at 20 minutes every other day. He is to take is BP daily and report elevations from baseline. He is to continue to weigh himself daily. We will see him monthly for encouragement and continued assessment.

## 2011-09-26 NOTE — Assessment & Plan Note (Signed)
Heart rate is well controlled on currently  medication regimen. He was tachycardic on triage assessment but once evaluation in exam room completed, HR was in 80 bpm.  No changes at this time time. He will go to coumadin clinic for labs during this visit.

## 2011-10-24 ENCOUNTER — Encounter: Payer: Self-pay | Admitting: Adult Health

## 2011-10-24 ENCOUNTER — Ambulatory Visit (INDEPENDENT_AMBULATORY_CARE_PROVIDER_SITE_OTHER): Payer: BC Managed Care – PPO | Admitting: Adult Health

## 2011-10-24 ENCOUNTER — Ambulatory Visit (INDEPENDENT_AMBULATORY_CARE_PROVIDER_SITE_OTHER): Payer: BC Managed Care – PPO | Admitting: *Deleted

## 2011-10-24 ENCOUNTER — Other Ambulatory Visit: Payer: Self-pay | Admitting: Adult Health

## 2011-10-24 DIAGNOSIS — I639 Cerebral infarction, unspecified: Secondary | ICD-10-CM

## 2011-10-24 DIAGNOSIS — R200 Anesthesia of skin: Secondary | ICD-10-CM | POA: Insufficient documentation

## 2011-10-24 DIAGNOSIS — I6529 Occlusion and stenosis of unspecified carotid artery: Secondary | ICD-10-CM

## 2011-10-24 DIAGNOSIS — I70219 Atherosclerosis of native arteries of extremities with intermittent claudication, unspecified extremity: Secondary | ICD-10-CM

## 2011-10-24 DIAGNOSIS — R209 Unspecified disturbances of skin sensation: Secondary | ICD-10-CM

## 2011-10-24 DIAGNOSIS — Z7901 Long term (current) use of anticoagulants: Secondary | ICD-10-CM

## 2011-10-24 DIAGNOSIS — I4891 Unspecified atrial fibrillation: Secondary | ICD-10-CM

## 2011-10-24 DIAGNOSIS — I519 Heart disease, unspecified: Secondary | ICD-10-CM

## 2011-10-24 DIAGNOSIS — I6522 Occlusion and stenosis of left carotid artery: Secondary | ICD-10-CM

## 2011-10-24 DIAGNOSIS — E782 Mixed hyperlipidemia: Secondary | ICD-10-CM

## 2011-10-24 DIAGNOSIS — E785 Hyperlipidemia, unspecified: Secondary | ICD-10-CM

## 2011-10-24 LAB — POCT INR: INR: 1.9

## 2011-10-24 NOTE — Patient Instructions (Signed)
**Note De-Identified Libi Corso Obfuscation** Your physician has requested that you have an ankle brachial index (ABI). During this test an ultrasound and blood pressure cuff are used to evaluate the arteries that supply the arms and legs with blood. Allow thirty minutes for this exam. There are no restrictions or special instructions.  Your physician has requested that you have a carotid duplex. This test is an ultrasound of the carotid arteries in your neck. It looks at blood flow through these arteries that supply the brain with blood. Allow one hour for this exam. There are no restrictions or special instructions.  Your physician has recommended you make the following change in your medication: Start taking (over the counter) Vitamin D 1000 u daily   Your physician recommends that you schedule a follow-up appointment in: 1 month

## 2011-10-24 NOTE — Progress Notes (Signed)
HPI: [name] is a 56 y/o patient of Dr. Dietrich Pates we are seeing for ongoing assessment and treatment of hypertension, atrial fibrillation, systolic dysfunction EF of 35%-40 % with history of acute CVA in December of 2012 with loss of vision in the left eye.Marland Kitchen Recommended medical management after arteriogram and assessment by Dr. Edilia Bo. He has been on coumadin and has followed up in our office for INR checks. He has been medically compliant and is now being careful concerning his diet. He is watching his salt intake. He has joined a Health visitor and wants to begin and exercise program. He is now motivated to be more engaged in his health maintenance. His only complaint is a headache with use of Imdur. He has recently bought a digital scale at home. He is resolved to put in paperwork for disability secondary to blindness and CM. He is discussing this with his HR department. He complains today of vivid dreams with metoprolol. He says he looked it up and it is a side effect. He wishes to have his carotids evaluated again by ultrasound. He also wants to have his lipids checked. He complaints also of leg pain and numbness.   Allergies  Allergen Reactions  . Other     Mayonnaise  . Penicillins Rash    Current Outpatient Prescriptions  Medication Sig Dispense Refill  . aspirin 325 MG tablet Take 325 mg by mouth daily.      . cholecalciferol (VITAMIN D) 1000 UNITS tablet Take 1,000 Units by mouth daily.      . furosemide (LASIX) 20 MG tablet Take 1 tablet (20 mg total) by mouth daily.  30 tablet  3  . isosorbide mononitrate (IMDUR) 30 MG 24 hr tablet Take 1 tablet (30 mg total) by mouth daily.  30 tablet  3  . metoprolol (TOPROL-XL) 50 MG 24 hr tablet Take 1 tablet (50 mg total) by mouth 2 (two) times daily.  60 tablet  3  . simvastatin (ZOCOR) 20 MG tablet Take 1 tablet (20 mg total) by mouth at bedtime.  30 tablet  3  . warfarin (COUMADIN) 10 MG tablet Take 1.5 tablets (15 mg total) by mouth daily.  As directed by Anticoagulation clinic.  45 tablet  2    Past Medical History  Diagnosis Date  . Hyperlipidemia   . Gilbert's syndrome   . Vertigo   . Diabetes mellitus     diet controlled  . Angina   . Hypertension   . Atrial fibrillation   . Atrial flutter 2007    h/o  . Stroke ~08/17/11    "lost vision left eye"  . Blood dyscrasia     Past Surgical History  Procedure Date  . Achilles tendon repair 06/2002    Left  . Cardioversion 2007    ZOX:WRUEAV of systems complete and found to be negative unless listed above PHYSICAL EXAM BP 139/79  Pulse 52  Resp 18  Ht 6\' 1"  (1.854 m)  Wt 273 lb (123.832 kg)  BMI 36.02 kg/m2  General: Well developed, well nourished, in no acute distress Head: Eyes PERRLA, No xanthomas.   Normal cephalic and atramatic  Lungs: Clear bilaterally to auscultation and percussion. Heart: HRIR S1 S2, without MRG.  Pulses are 2+ & equal.            No carotid bruit. No JVD.  No abdominal bruits. No femoral bruits. Abdomen: Bowel sounds are positive, abdomen soft and non-tender without masses or  Hernia's noted. Msk:  Back normal, normal gait. Normal strength and tone for age. Extremities: No clubbing, cyanosis or edema. Doppler pulses are present, but thready. Neuro: Alert and oriented X 3. Psych:  Good affect, responds appropriately  ASSESSMENT AND PLAN

## 2011-10-24 NOTE — Assessment & Plan Note (Signed)
Lipids and LFT's will be completed prior to next appointment.

## 2011-10-24 NOTE — Assessment & Plan Note (Signed)
Heart rate is well controlled. He does not want to be taken off of the metoprolol. He just wanted me to know that his dreams are very vivid on this medication. His PT/INR was checked today by Susa Raring. 1.7. Coumadin dose is adjusted.

## 2011-10-24 NOTE — Progress Notes (Signed)
**Note De-Identified [name] Obfuscation** Addended by: Demetrios Loll on: 10/24/2011 01:53 PM   Modules accepted: Orders

## 2011-10-24 NOTE — Assessment & Plan Note (Signed)
He is requesting reassessment of his carotid arteries for close follow-up. He is anxious about another stroke.  I have ordered this for him despite my recommendations to wait until May when it has already been scheduled.

## 2011-10-24 NOTE — Assessment & Plan Note (Signed)
I have checked his DP and PT pulses by palpation and found them difficult to locate. I used the doppler and was able to auscultate his pulses but sounded distant. Will atrial fib no consistent sound was heard. I will have bilateral ABI with ultrasound completed for better evaluation of this. He is to continue on current medications as directed.

## 2011-10-27 ENCOUNTER — Ambulatory Visit (HOSPITAL_COMMUNITY)
Admission: RE | Admit: 2011-10-27 | Discharge: 2011-10-27 | Disposition: A | Discharge status: Home / Self Care | Payer: BC Managed Care – PPO | Source: Ambulatory Visit | Attending: Adult Health | Admitting: Adult Health

## 2011-10-27 ENCOUNTER — Encounter: Payer: Self-pay | Admitting: *Deleted

## 2011-10-27 ENCOUNTER — Other Ambulatory Visit (HOSPITAL_COMMUNITY): Payer: BC Managed Care – PPO

## 2011-10-27 ENCOUNTER — Telehealth: Payer: Self-pay | Admitting: *Deleted

## 2011-10-27 DIAGNOSIS — I10 Essential (primary) hypertension: Secondary | ICD-10-CM | POA: Insufficient documentation

## 2011-10-27 DIAGNOSIS — I6529 Occlusion and stenosis of unspecified carotid artery: Secondary | ICD-10-CM | POA: Insufficient documentation

## 2011-10-27 DIAGNOSIS — E119 Type 2 diabetes mellitus without complications: Secondary | ICD-10-CM | POA: Insufficient documentation

## 2011-10-27 DIAGNOSIS — I739 Peripheral vascular disease, unspecified: Secondary | ICD-10-CM | POA: Insufficient documentation

## 2011-10-27 LAB — HEPATIC FUNCTION PANEL
AST: 19 U/L (ref 0–37)
Albumin: 4.3 g/dL (ref 3.5–5.2)
Alkaline Phosphatase: 52 U/L (ref 39–117)
Total Bilirubin: 1.3 mg/dL — ABNORMAL HIGH (ref 0.3–1.2)

## 2011-10-27 NOTE — Telephone Encounter (Signed)
Attempted to contact patient at all numbers provided regarding Carotid Ultrasound.  Letter sent.

## 2011-10-28 LAB — LIPID PANEL
HDL: 37 mg/dL — ABNORMAL LOW (ref >39)
Triglycerides: 81 mg/dL (ref <150)

## 2011-10-31 ENCOUNTER — Other Ambulatory Visit: Payer: Self-pay

## 2011-10-31 NOTE — Telephone Encounter (Signed)
Message copied by Demetrios Loll on Mon Oct 31, 2011 10:34 AM ------      Message from: Jodelle Gross      Created: Caleen Essex Oct 28, 2011  1:07 PM       Change to Pravachol 40 mg at HS. Stop lipitor due to mild liver enzyme elevation. LDL should be < 100. It is now 135, no different than last couple of years. He needs to do a better job on low cholesterol diet. Repeat lipids and LFT's in 6 weeks. Add fish oil 1000 mg daily to diet.            Samara Deist

## 2011-10-31 NOTE — Telephone Encounter (Signed)
Will discuss on that appointment  Samara Deist

## 2011-10-31 NOTE — Telephone Encounter (Addendum)
**Note De-Identified [name] Obfuscation** Pt. Is not on Lipitor at this time, he is taking Simvastatin 20 mg at HS. He states that he has a liver condition (?) that effects his liver enzyme's. Pt. Is scheduled to see K. Lyman Bishop, NP tomorrow (2-26). Please advise./LV

## 2011-11-01 ENCOUNTER — Ambulatory Visit (INDEPENDENT_AMBULATORY_CARE_PROVIDER_SITE_OTHER): Payer: BC Managed Care – PPO | Admitting: Adult Health

## 2011-11-01 ENCOUNTER — Encounter: Payer: Self-pay | Admitting: Adult Health

## 2011-11-01 DIAGNOSIS — I6529 Occlusion and stenosis of unspecified carotid artery: Secondary | ICD-10-CM

## 2011-11-01 DIAGNOSIS — E785 Hyperlipidemia, unspecified: Secondary | ICD-10-CM

## 2011-11-01 DIAGNOSIS — I6522 Occlusion and stenosis of left carotid artery: Secondary | ICD-10-CM

## 2011-11-01 MED ORDER — SIMVASTATIN 40 MG PO TABS: 40.0000 mg | ORAL_TABLET | Freq: Every day | ORAL | Status: DC

## 2011-11-01 NOTE — Progress Notes (Signed)
HPI: [name] is a 56 y/o patient of Dr. Dietrich Pates we are seeing for ongoing assessment and treatment of hypertension, atrial fibrillation, systolic dysfunction EF of 35%-40 % with history of acute CVA in December of 2012 with loss of vision in the left eye.Marland Kitchen Recommended medical management after arteriogram and assessment by Dr. Edilia Bo. He has been on coumadin and has followed up in our office for INR checks. He has been medically compliant and is now being careful concerning his diet. He is watching his salt intake. He has joined a Health visitor and wants to begin and exercise program. He is now motivated to be more engaged in his health maintenance. His only complaint is a headache with use of Imdur. He has recently bought a digital scale at home. He is resolved to put in paperwork for disability secondary to blindness and CM. He is discussing this with his HR department. He complains today of vivid dreams with metoprolol. He says he looked it up and it is a side effect. He wishes to have his carotids evaluated again by ultrasound. He also wants to have his lipids checked. He complaints also of leg pain and numbness.  He is here for result of these tests.  Allergies  Allergen Reactions  . Other     Mayonnaise  . Penicillins Rash    Current Outpatient Prescriptions  Medication Sig Dispense Refill  . aspirin 325 MG tablet Take 325 mg by mouth daily.      . cholecalciferol (VITAMIN D) 1000 UNITS tablet Take 1,000 Units by mouth daily.      . furosemide (LASIX) 20 MG tablet Take 1 tablet (20 mg total) by mouth daily.  30 tablet  3  . isosorbide mononitrate (IMDUR) 30 MG 24 hr tablet Take 1 tablet (30 mg total) by mouth daily.  30 tablet  3  . metoprolol (TOPROL-XL) 50 MG 24 hr tablet Take 1 tablet (50 mg total) by mouth 2 (two) times daily.  60 tablet  3  . Omega-3 Fatty Acids (FISH OIL) 1000 MG CAPS Take 1,000 mg by mouth daily.      . simvastatin (ZOCOR) 40 MG tablet Take 1 tablet (40 mg total)  by mouth at bedtime.  30 tablet  3  . warfarin (COUMADIN) 10 MG tablet Take 1.5 tablets (15 mg total) by mouth daily. As directed by Anticoagulation clinic.  45 tablet  2    Past Medical History  Diagnosis Date  . Hyperlipidemia   . Gilbert's syndrome   . Vertigo   . Diabetes mellitus     diet controlled  . Angina   . Hypertension   . Atrial fibrillation   . Atrial flutter 2007    h/o  . Stroke ~08/17/11    "lost vision left eye"  . Blood dyscrasia     Past Surgical History  Procedure Date  . Achilles tendon repair 06/2002    Left  . Cardioversion 2007    WUJ:WJXBJY of systems complete and found to be negative unless listed above PHYSICAL EXAM BP 134/86  Pulse 91  Ht 6\' 1"  (1.854 m)  Wt 275 lb (124.739 kg)  BMI 36.28 kg/m2  General: Well developed, well nourished, in no acute distress Head: Eyes PERRLA, No xanthomas.   Normal cephalic and atramatic  Lungs: Clear bilaterally to auscultation and percussion. Heart: HRIR S1 S2, without MRG.  Pulses are 2+ & equal.            No carotid bruit.  No JVD.  No abdominal bruits. No femoral bruits. Abdomen: Bowel sounds are positive, abdomen soft and non-tender without masses or                  Hernia's noted. Msk:  Back normal, normal gait. Normal strength and tone for age. Extremities: No clubbing, cyanosis or edema. Doppler pulses are present, but thready. Neuro: Alert and oriented X 3. Psych:  Good affect, responds appropriately  ASSESSMENT AND PLAN

## 2011-11-01 NOTE — Assessment & Plan Note (Signed)
Repeat of carotid studies on 2./21/2013   1. Critical subtotal occlusion in the proximal left internal carotid artery. Recommend vascular surgical or interventional radiology consultation. 2. Right carotid bifurcation and proximal ICA plaque resulting in less than 50% diameter stenosis. 3. Patency of bilateral vertebral arteries.  He has been referred to Dr. Fabienne Bruns [facility] for more recommendations and possible surgery. He is made aware of this and is willing to proceed with [facility] evaluation.

## 2011-11-01 NOTE — Patient Instructions (Signed)
**Note De-Identified Byrant Valent Obfuscation** Your physician has recommended you make the following change in your medication: increase Simvastatin to 40 mg at bedtime.  Your physician recommends that you schedule a follow-up appointment in: 3 months

## 2011-11-01 NOTE — Assessment & Plan Note (Signed)
Recent labs TC 188 TG 81, LDL 135.  I have increased his Zocor to 40 mg at HS. Follow-up lipids and LFTs will be completed in 3 months.

## 2011-11-09 ENCOUNTER — Encounter: Payer: Self-pay | Admitting: Vascular Surgery

## 2011-11-10 ENCOUNTER — Encounter: Payer: Self-pay | Admitting: Vascular Surgery

## 2011-11-10 ENCOUNTER — Ambulatory Visit (INDEPENDENT_AMBULATORY_CARE_PROVIDER_SITE_OTHER): Payer: BC Managed Care – PPO | Admitting: Vascular Surgery

## 2011-11-10 VITALS — BP 117/78 | HR 97 | Resp 16 | Ht 73.0 in | Wt 277.0 lb

## 2011-11-10 DIAGNOSIS — I6529 Occlusion and stenosis of unspecified carotid artery: Secondary | ICD-10-CM

## 2011-11-10 NOTE — Progress Notes (Signed)
History of Present Illness:  Patient is a 56 y.o. year old male who presents for evaluation of carotid stenosis.  The patient denies symptoms of TIA, amaurosis, or stroke.  The patient is currently on aspirin antiplatelet therapy.  The carotid stenosis was found to be an occlusion on the left side confirmed by arteriogram by my partner Dr Edilia Bo in Dec 2012.  Other medical problems include hyperlipidemia, diabetes, hypertension, atrial fibrillation, coronary disease.  These are currently stable and followed by his primary care physician.  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 This is not recovered. He has had no new symptoms. He has stopped smoking. He is also exercising 3 days a week and is able to get his heart rate up in the 120s. He denies chest pain or shortness of breath with this. He was noted on recent carotid duplex exam and at [facility] to possibly have trickle flow in the left internal carotid artery. He was referred here for further evaluation of this.  Past Medical History  Diagnosis Date  . Hyperlipidemia   . Gilbert's syndrome   . Vertigo   . Diabetes mellitus     diet controlled  . Angina   . Hypertension   . Atrial fibrillation   . Atrial flutter 2007    h/o  . Stroke ~08/17/11    "lost vision left eye"  . Blood dyscrasia   . Coronary artery disease 08/19/11  . Peripheral vascular disease     Past Surgical History  Procedure Date  . Achilles tendon repair 06/2002    Left  . Cardioversion 2007     Social History History  Substance Use Topics  . Smoking status: Former Smoker -- 0.0 packs/day for 10 years    Types: Cigars    Quit date: 08/16/2011  . Smokeless tobacco: Never Used  . Alcohol Use: 1.8 oz/week    3 Cans of beer per week    Family History Family History  Problem Relation Age of Onset  . Diabetes Mother   . Hyperlipidemia Mother   . Hypertension Mother   . Emphysema Father   .  Heart failure Father   . Heart attack Brother   . Aneurysm Sister   . Deep vein thrombosis Sister   . Diabetes Sister   . Heart attack Sister     Allergies  Allergies  Allergen Reactions  . Other     Mayonnaise  . Penicillins Rash     Current Outpatient Prescriptions  Medication Sig Dispense Refill  . aspirin 325 MG tablet Take 325 mg by mouth daily.      . cholecalciferol (VITAMIN D) 1000 UNITS tablet Take 1,000 Units by mouth daily.      . furosemide (LASIX) 20 MG tablet Take 1 tablet (20 mg total) by mouth daily.  30 tablet  3  . isosorbide mononitrate (IMDUR) 30 MG 24 hr tablet Take 1 tablet (30 mg total) by mouth daily.  30 tablet  3  . metoprolol (TOPROL-XL) 50 MG 24 hr tablet Take 1 tablet (50 mg total) by mouth 2 (two) times daily.  60 tablet  3  . Omega-3 Fatty Acids (FISH OIL) 1000 MG CAPS Take 1,000 mg by mouth daily.      . simvastatin (ZOCOR) 40 MG tablet Take 1 tablet (40 mg total) by mouth at bedtime.  30 tablet  3  . warfarin (COUMADIN) 10 MG tablet Take  1.5 tablets (15 mg total) by mouth daily. As directed by Anticoagulation clinic.  45 tablet  2    ROS:   General:  No weight loss, Fever, chills  HEENT: No recent headaches, no nasal bleeding, no visual changes, no sore throat  Neurologic: No dizziness, blackouts, seizures. No recent symptoms of stroke or mini- stroke. No recent episodes of slurred speech, or temporary blindness.  Cardiac: No recent episodes of chest pain/pressure, no shortness of breath at rest.  No shortness of breath with exertion.  Denies history of atrial fibrillation or irregular heartbeat  Vascular: No history of rest pain in feet.  No history of claudication.  No history of non-healing ulcer, No history of DVT   Pulmonary: No home oxygen, no productive cough, no hemoptysis,  No asthma or wheezing  Musculoskeletal:  [ ]  Arthritis, [ ]  Low back pain,  [ ]  Joint pain  Hematologic:No history of hypercoagulable state.  No history of  easy bleeding.  No history of anemia  Gastrointestinal: No hematochezia or melena,  No gastroesophageal reflux, no trouble swallowing  Urinary: [ ]  chronic Kidney disease, [ ]  on HD - [ ]  MWF or [ ]  TTHS, [ ]  Burning with urination, [ ]  Frequent urination, [ ]  Difficulty urinating;   Skin: No rashes  Psychological: No history of anxiety,  No history of depression   Physical Examination  Filed Vitals:   11/10/11 1040 11/10/11 1041  BP: 129/73 117/78  Pulse: 94 97  Resp: 16   Height: 6\' 1"  (1.854 m)   Weight: 277 lb (125.646 kg)   SpO2: 100% 99%    Body mass index is 36.55 kg/(m^2).  General:  Alert and oriented, no acute distress HEENT: Normal Neck: No bruit or JVD Pulmonary: Clear to auscultation bilaterally Cardiac: Regular Rate and Rhythm without murmur Gastrointestinal: Soft, non-tender, non-distended, no mass, obese Skin: No rash Extremity Pulses:  2+ radial, brachial  pulses bilaterally Musculoskeletal: No deformity or edema  Neurologic: Upper and lower extremity motor 5/5 and symmetric, blind left eye  DATA: I reviewed his carotid duplex exam from any [facility] today. This did have some color flow in the left internal carotid artery. He had a moderate right internal carotid artery stenosis but less than 50%. On his arteriogram in December of 2012. He had no significant left internal carotid artery stenosis   ASSESSMENT: Although duplex scan suggested some color flow trip on the left side. The patient had a arteriogram in December of 2012 which showed chronic total occlusion. I do not believe that he is at recannulization of this artery. I do not believe the left internal carotid artery needs any intervention at this point. I discussed all this with the patient today. He does need continued followup for his moderate to mild carotid stenosis on the right side. This currently is asymptomatic. At his next carotid duplex exam we will do the right side only. I do not  believe any further scans of the left side are necessary.   PLAN:  Repeat right internal carotid artery duplex scan in 6 months time. No intervention necessary on the left side. Although there was some color flow on the left side obvious duplex exam this is a chronic total occlusion based on arteriogram from December of 2012. The patient will continue his aspirin.   Fabienne Bruns, MD [facility] Office: [phone] Pager: [phone]

## 2011-11-16 ENCOUNTER — Other Ambulatory Visit: Payer: Self-pay | Admitting: *Deleted

## 2011-11-16 DIAGNOSIS — I6529 Occlusion and stenosis of unspecified carotid artery: Secondary | ICD-10-CM

## 2011-11-21 ENCOUNTER — Ambulatory Visit (INDEPENDENT_AMBULATORY_CARE_PROVIDER_SITE_OTHER): Payer: BC Managed Care – PPO | Admitting: *Deleted

## 2011-11-21 ENCOUNTER — Ambulatory Visit: Payer: BC Managed Care – PPO | Admitting: Adult Health

## 2011-11-21 DIAGNOSIS — I639 Cerebral infarction, unspecified: Secondary | ICD-10-CM

## 2011-11-21 DIAGNOSIS — Z7901 Long term (current) use of anticoagulants: Secondary | ICD-10-CM

## 2011-11-21 DIAGNOSIS — I4891 Unspecified atrial fibrillation: Secondary | ICD-10-CM

## 2011-11-21 DIAGNOSIS — I6350 Cerebral infarction due to unspecified occlusion or stenosis of unspecified cerebral artery: Secondary | ICD-10-CM

## 2011-11-30 ENCOUNTER — Encounter: Payer: Self-pay | Admitting: Cardiology

## 2011-12-19 ENCOUNTER — Ambulatory Visit (INDEPENDENT_AMBULATORY_CARE_PROVIDER_SITE_OTHER): Payer: BC Managed Care – PPO | Admitting: *Deleted

## 2011-12-19 DIAGNOSIS — I639 Cerebral infarction, unspecified: Secondary | ICD-10-CM

## 2011-12-19 DIAGNOSIS — I6350 Cerebral infarction due to unspecified occlusion or stenosis of unspecified cerebral artery: Secondary | ICD-10-CM

## 2011-12-19 DIAGNOSIS — Z7901 Long term (current) use of anticoagulants: Secondary | ICD-10-CM

## 2011-12-19 DIAGNOSIS — I4891 Unspecified atrial fibrillation: Secondary | ICD-10-CM

## 2011-12-19 LAB — POCT INR: INR: 2.7

## 2012-01-03 ENCOUNTER — Other Ambulatory Visit: Payer: Self-pay | Admitting: Adult Health

## 2012-01-16 ENCOUNTER — Ambulatory Visit (INDEPENDENT_AMBULATORY_CARE_PROVIDER_SITE_OTHER): Payer: BC Managed Care – PPO | Admitting: *Deleted

## 2012-01-16 DIAGNOSIS — I4891 Unspecified atrial fibrillation: Secondary | ICD-10-CM

## 2012-01-16 DIAGNOSIS — Z7901 Long term (current) use of anticoagulants: Secondary | ICD-10-CM

## 2012-01-16 DIAGNOSIS — I639 Cerebral infarction, unspecified: Secondary | ICD-10-CM

## 2012-01-16 DIAGNOSIS — I6350 Cerebral infarction due to unspecified occlusion or stenosis of unspecified cerebral artery: Secondary | ICD-10-CM

## 2012-01-16 LAB — POCT INR: INR: 2.3

## 2012-01-25 ENCOUNTER — Encounter: Payer: Self-pay | Admitting: Physician Assistant

## 2012-01-25 ENCOUNTER — Ambulatory Visit (INDEPENDENT_AMBULATORY_CARE_PROVIDER_SITE_OTHER): Payer: BC Managed Care – PPO | Admitting: Physician Assistant

## 2012-01-25 VITALS — BP 131/69 | HR 63 | Ht 72.0 in | Wt 270.0 lb

## 2012-01-25 DIAGNOSIS — I4891 Unspecified atrial fibrillation: Secondary | ICD-10-CM

## 2012-01-25 DIAGNOSIS — I255 Ischemic cardiomyopathy: Secondary | ICD-10-CM

## 2012-01-25 DIAGNOSIS — I6522 Occlusion and stenosis of left carotid artery: Secondary | ICD-10-CM

## 2012-01-25 DIAGNOSIS — I6529 Occlusion and stenosis of unspecified carotid artery: Secondary | ICD-10-CM

## 2012-01-25 DIAGNOSIS — I2589 Other forms of chronic ischemic heart disease: Secondary | ICD-10-CM

## 2012-01-25 NOTE — Progress Notes (Signed)
HPI:  This is a 56 year old African American male patient of Dr. Crosby Oyster who has history of hypertension, atrial fibrillation, and LV dysfunction ejection fraction 35-40%. His also had a CVA in 08/2011 with loss of vision in his left eye. He recently saw Dr. Leonette Most fields for followup of his carotids. He recommends six-month followup of her right carotid stenosis but no longer needs followup on his left carotid which is totally occluded.  The patient is feeling quite well. He started exercising 3 days a week and feels much better. He also quit smoking in December. He denies dyspnea, dyspnea on exertion, dizziness, or presyncope. Sometimes he feels like he goes in and out of atrial fibrillation but he is not sure. He says when he gets anxious he feels like he is going into atrial fibrillation. He also complains of vivid dreams on metoprolol. He says they occur 2-3 days a week and they are not bad enough to stop the medication.   -- Other    --  Mayonnaise  -- Penicillins -- Rash  Current Outpatient Prescriptions on File Prior to Visit: aspirin 325 MG tablet, Take 325 mg by mouth daily., Disp: , Rfl:  cholecalciferol (VITAMIN D) 1000 UNITS tablet, Take 1,000 Units by mouth daily., Disp: , Rfl:  furosemide (LASIX) 20 MG tablet, TAKE ONE TABLET BY MOUTH EVERY DAY, Disp: 30 tablet, Rfl: 6 isosorbide mononitrate (IMDUR) 30 MG 24 hr tablet, Take 1 tablet (30 mg total) by mouth daily., Disp: 30 tablet, Rfl: 3 metoprolol (TOPROL-XL) 50 MG 24 hr tablet, Take 1 tablet (50 mg total) by mouth 2 (two) times daily., Disp: 60 tablet, Rfl: 3 Omega-3 Fatty Acids (FISH OIL) 1000 MG CAPS, Take 1,000 mg by mouth daily., Disp: , Rfl:  warfarin (COUMADIN) 10 MG tablet, TAKE ONE & ONE-HALF TABLETS BY MOUTH EVERY DAY AS DIRECTED BY  ANTICOAGULATION  CLINIC, Disp: 45 tablet, Rfl: 3 DISCONTD: simvastatin (ZOCOR) 40 MG tablet, Take 1 tablet (40 mg total) by mouth at bedtime., Disp: 30 tablet, Rfl: 3    Past Medical  History:   Hyperlipidemia                                               Gilbert's syndrome                                           Vertigo                                                      Diabetes mellitus                                              Comment:diet controlled   Angina  Hypertension                                                 Atrial fibrillation                                          Atrial flutter                                  2007           Comment:h/o   Stroke                                          ~08/17/11      Comment:"lost vision left eye"   Blood dyscrasia                                              Coronary artery disease                         08/19/11     Peripheral vascular disease                                 Past Surgical History:   ACHILLES TENDON REPAIR                          06/2002        Comment:Left   CARDIOVERSION                                   2007        Review of patient's family history indicates:   Diabetes                       Mother                   Hyperlipidemia                 Mother                   Hypertension                   Mother                   Emphysema                      Father                   Heart failure                  Father                   Heart  attack                   Brother                  Aneurysm                       Sister                   Deep vein thrombosis           Sister                   Diabetes                       Sister                   Heart attack                   Sister                   Social History   Marital Status: Married             Spouse Name:                      Years of Education:                 Number of children:             Occupational History   None on file  Social History Main Topics   Smoking Status: Former Smoker                   Packs/Day: .01   Years: 10        Types:  Cigars     Quit date: 08/16/2011   Smokeless Status: Never Used                       Alcohol Use: Yes           1.8 oz/week      3 Cans of beer per week   Drug Use: No             Sexual Activity: Yes                    Birth Control/Protection: Condom  Other Topics            Concern   None on file  Social History Narrative   None on file    ROS:see history of present illness otherwise negative   PHYSICAL EXAM: Well-nournished, in no acute distress. Neck:right carotid Bruit, No JVD, HJR, or thyroid enlargement  Lungs: No tachypnea, clear without wheezing, rales, or rhonchi  Cardiovascular: irregular, PMI not displaced, heart sounds distant, no murmurs, gallops, bruit, thrill, or heave.  Abdomen: BS normal. Soft without organomegaly, masses, lesions or tenderness.  Extremities: without cyanosis, clubbing or edema. Good distal pulses bilateral  SKin: Warm, no lesions or rashes   Musculoskeletal: No deformities  Neuro: no focal signs  BP 131/69  Pulse 63  Ht 6' (1.829 m)  Wt 270 lb (122.471 kg)  BMI 36.62 kg/m2

## 2012-01-25 NOTE — Assessment & Plan Note (Signed)
Patient needs follow-up of her right carotid stenosis in 6 months.no need for follow-up Doppler of his left carotid according to Dr. Darrick Penna.

## 2012-01-25 NOTE — Assessment & Plan Note (Signed)
Rate controlled. Patient thinks he may be going in and out of of atrial fibrillation at times. He is on Coumadin.

## 2012-01-25 NOTE — Assessment & Plan Note (Signed)
Patient is compensated today. He is feeling much better since he began exercising 3 days a week.

## 2012-01-25 NOTE — Patient Instructions (Signed)
**Note De-identified [name] Obfuscation** Your physician recommends that you continue on your current medications as directed. Please refer to the Current Medication list given to you today.  Your physician recommends that you schedule a follow-up appointment in: 6 months  

## 2012-01-31 ENCOUNTER — Ambulatory Visit: Payer: BC Managed Care – PPO | Admitting: Adult Health

## 2012-02-13 ENCOUNTER — Ambulatory Visit (INDEPENDENT_AMBULATORY_CARE_PROVIDER_SITE_OTHER): Payer: BC Managed Care – PPO | Admitting: *Deleted

## 2012-02-13 DIAGNOSIS — I6350 Cerebral infarction due to unspecified occlusion or stenosis of unspecified cerebral artery: Secondary | ICD-10-CM

## 2012-02-13 DIAGNOSIS — I4891 Unspecified atrial fibrillation: Secondary | ICD-10-CM

## 2012-02-13 DIAGNOSIS — I639 Cerebral infarction, unspecified: Secondary | ICD-10-CM

## 2012-02-13 DIAGNOSIS — Z7901 Long term (current) use of anticoagulants: Secondary | ICD-10-CM

## 2012-02-13 LAB — POCT INR: INR: 2.9

## 2012-03-26 ENCOUNTER — Ambulatory Visit (INDEPENDENT_AMBULATORY_CARE_PROVIDER_SITE_OTHER): Payer: BC Managed Care – PPO | Admitting: *Deleted

## 2012-03-26 DIAGNOSIS — Z7901 Long term (current) use of anticoagulants: Secondary | ICD-10-CM

## 2012-03-26 DIAGNOSIS — I639 Cerebral infarction, unspecified: Secondary | ICD-10-CM

## 2012-03-26 DIAGNOSIS — I4891 Unspecified atrial fibrillation: Secondary | ICD-10-CM

## 2012-03-26 DIAGNOSIS — I6350 Cerebral infarction due to unspecified occlusion or stenosis of unspecified cerebral artery: Secondary | ICD-10-CM

## 2012-03-26 LAB — POCT INR: INR: 1.7

## 2012-04-09 ENCOUNTER — Other Ambulatory Visit: Payer: Self-pay | Admitting: Adult Health

## 2012-04-11 ENCOUNTER — Ambulatory Visit (INDEPENDENT_AMBULATORY_CARE_PROVIDER_SITE_OTHER): Payer: BC Managed Care – PPO | Admitting: *Deleted

## 2012-04-11 DIAGNOSIS — I639 Cerebral infarction, unspecified: Secondary | ICD-10-CM

## 2012-04-11 DIAGNOSIS — Z7901 Long term (current) use of anticoagulants: Secondary | ICD-10-CM

## 2012-04-11 DIAGNOSIS — I6350 Cerebral infarction due to unspecified occlusion or stenosis of unspecified cerebral artery: Secondary | ICD-10-CM

## 2012-04-11 DIAGNOSIS — I4891 Unspecified atrial fibrillation: Secondary | ICD-10-CM

## 2012-04-11 LAB — POCT INR: INR: 2.9

## 2012-05-14 ENCOUNTER — Other Ambulatory Visit: Payer: Self-pay | Admitting: Adult Health

## 2012-05-16 ENCOUNTER — Ambulatory Visit (INDEPENDENT_AMBULATORY_CARE_PROVIDER_SITE_OTHER): Payer: BC Managed Care – PPO | Admitting: *Deleted

## 2012-05-16 DIAGNOSIS — Z7901 Long term (current) use of anticoagulants: Secondary | ICD-10-CM

## 2012-05-16 DIAGNOSIS — I4891 Unspecified atrial fibrillation: Secondary | ICD-10-CM

## 2012-05-16 DIAGNOSIS — I6350 Cerebral infarction due to unspecified occlusion or stenosis of unspecified cerebral artery: Secondary | ICD-10-CM

## 2012-05-16 DIAGNOSIS — I639 Cerebral infarction, unspecified: Secondary | ICD-10-CM

## 2012-05-23 ENCOUNTER — Ambulatory Visit (INDEPENDENT_AMBULATORY_CARE_PROVIDER_SITE_OTHER): Payer: BC Managed Care – PPO | Admitting: *Deleted

## 2012-05-23 DIAGNOSIS — Z7901 Long term (current) use of anticoagulants: Secondary | ICD-10-CM

## 2012-05-23 DIAGNOSIS — I639 Cerebral infarction, unspecified: Secondary | ICD-10-CM

## 2012-05-23 DIAGNOSIS — I6350 Cerebral infarction due to unspecified occlusion or stenosis of unspecified cerebral artery: Secondary | ICD-10-CM

## 2012-05-23 DIAGNOSIS — I4891 Unspecified atrial fibrillation: Secondary | ICD-10-CM

## 2012-05-23 LAB — POCT INR: INR: 2.3

## 2012-06-14 ENCOUNTER — Other Ambulatory Visit: Payer: Self-pay | Admitting: Adult Health

## 2012-06-18 ENCOUNTER — Ambulatory Visit (INDEPENDENT_AMBULATORY_CARE_PROVIDER_SITE_OTHER): Payer: BC Managed Care – PPO | Admitting: *Deleted

## 2012-06-18 DIAGNOSIS — I4891 Unspecified atrial fibrillation: Secondary | ICD-10-CM

## 2012-06-18 DIAGNOSIS — I639 Cerebral infarction, unspecified: Secondary | ICD-10-CM

## 2012-06-18 DIAGNOSIS — Z7901 Long term (current) use of anticoagulants: Secondary | ICD-10-CM

## 2012-06-18 DIAGNOSIS — I6350 Cerebral infarction due to unspecified occlusion or stenosis of unspecified cerebral artery: Secondary | ICD-10-CM

## 2012-06-18 LAB — POCT INR: INR: 5.1

## 2012-06-21 ENCOUNTER — Ambulatory Visit (INDEPENDENT_AMBULATORY_CARE_PROVIDER_SITE_OTHER): Payer: BC Managed Care – PPO | Admitting: *Deleted

## 2012-06-21 DIAGNOSIS — I6350 Cerebral infarction due to unspecified occlusion or stenosis of unspecified cerebral artery: Secondary | ICD-10-CM

## 2012-06-21 DIAGNOSIS — Z7901 Long term (current) use of anticoagulants: Secondary | ICD-10-CM

## 2012-06-21 DIAGNOSIS — I4891 Unspecified atrial fibrillation: Secondary | ICD-10-CM

## 2012-06-21 DIAGNOSIS — I639 Cerebral infarction, unspecified: Secondary | ICD-10-CM

## 2012-06-21 LAB — POCT INR: INR: 1.6

## 2012-07-19 ENCOUNTER — Ambulatory Visit (INDEPENDENT_AMBULATORY_CARE_PROVIDER_SITE_OTHER): Payer: BC Managed Care – PPO | Admitting: *Deleted

## 2012-07-19 DIAGNOSIS — Z7901 Long term (current) use of anticoagulants: Secondary | ICD-10-CM

## 2012-07-19 DIAGNOSIS — I6350 Cerebral infarction due to unspecified occlusion or stenosis of unspecified cerebral artery: Secondary | ICD-10-CM

## 2012-07-19 DIAGNOSIS — I639 Cerebral infarction, unspecified: Secondary | ICD-10-CM

## 2012-07-19 DIAGNOSIS — I4891 Unspecified atrial fibrillation: Secondary | ICD-10-CM

## 2012-07-26 ENCOUNTER — Ambulatory Visit (INDEPENDENT_AMBULATORY_CARE_PROVIDER_SITE_OTHER): Payer: BC Managed Care – PPO | Admitting: *Deleted

## 2012-07-26 DIAGNOSIS — I6350 Cerebral infarction due to unspecified occlusion or stenosis of unspecified cerebral artery: Secondary | ICD-10-CM

## 2012-07-26 DIAGNOSIS — Z7901 Long term (current) use of anticoagulants: Secondary | ICD-10-CM

## 2012-07-26 DIAGNOSIS — I4891 Unspecified atrial fibrillation: Secondary | ICD-10-CM

## 2012-07-26 DIAGNOSIS — I639 Cerebral infarction, unspecified: Secondary | ICD-10-CM

## 2012-07-26 LAB — POCT INR: INR: 3.5

## 2012-08-09 ENCOUNTER — Ambulatory Visit (INDEPENDENT_AMBULATORY_CARE_PROVIDER_SITE_OTHER): Payer: BC Managed Care – PPO | Admitting: *Deleted

## 2012-08-09 DIAGNOSIS — I6350 Cerebral infarction due to unspecified occlusion or stenosis of unspecified cerebral artery: Secondary | ICD-10-CM

## 2012-08-09 DIAGNOSIS — Z7901 Long term (current) use of anticoagulants: Secondary | ICD-10-CM

## 2012-08-09 DIAGNOSIS — I639 Cerebral infarction, unspecified: Secondary | ICD-10-CM

## 2012-08-09 DIAGNOSIS — I4891 Unspecified atrial fibrillation: Secondary | ICD-10-CM

## 2012-08-09 LAB — POCT INR: INR: 3.3

## 2012-08-22 ENCOUNTER — Other Ambulatory Visit: Payer: Self-pay | Admitting: Adult Health

## 2012-08-23 ENCOUNTER — Ambulatory Visit (INDEPENDENT_AMBULATORY_CARE_PROVIDER_SITE_OTHER): Payer: BC Managed Care – PPO | Admitting: *Deleted

## 2012-08-23 DIAGNOSIS — I4891 Unspecified atrial fibrillation: Secondary | ICD-10-CM

## 2012-08-23 DIAGNOSIS — I6350 Cerebral infarction due to unspecified occlusion or stenosis of unspecified cerebral artery: Secondary | ICD-10-CM

## 2012-08-23 DIAGNOSIS — I639 Cerebral infarction, unspecified: Secondary | ICD-10-CM

## 2012-08-23 DIAGNOSIS — Z7901 Long term (current) use of anticoagulants: Secondary | ICD-10-CM

## 2012-08-24 ENCOUNTER — Encounter: Payer: Self-pay | Admitting: Adult Health

## 2012-08-24 ENCOUNTER — Ambulatory Visit (INDEPENDENT_AMBULATORY_CARE_PROVIDER_SITE_OTHER): Payer: BC Managed Care – PPO | Admitting: Adult Health

## 2012-08-24 VITALS — BP 150/82 | HR 128 | Ht 73.0 in | Wt 287.0 lb

## 2012-08-24 DIAGNOSIS — E782 Mixed hyperlipidemia: Secondary | ICD-10-CM

## 2012-08-24 DIAGNOSIS — I2589 Other forms of chronic ischemic heart disease: Secondary | ICD-10-CM

## 2012-08-24 DIAGNOSIS — I10 Essential (primary) hypertension: Secondary | ICD-10-CM

## 2012-08-24 DIAGNOSIS — E785 Hyperlipidemia, unspecified: Secondary | ICD-10-CM

## 2012-08-24 DIAGNOSIS — I4891 Unspecified atrial fibrillation: Secondary | ICD-10-CM

## 2012-08-24 DIAGNOSIS — I255 Ischemic cardiomyopathy: Secondary | ICD-10-CM

## 2012-08-24 DIAGNOSIS — E119 Type 2 diabetes mellitus without complications: Secondary | ICD-10-CM

## 2012-08-24 MED ORDER — METOPROLOL SUCCINATE ER 25 MG PO TB24: 75.0000 mg | ORAL_TABLET | Freq: Every day | ORAL | Status: DC

## 2012-08-24 NOTE — Assessment & Plan Note (Signed)
Heart rate is not well controlled. He said he is nervous about coming to appointment today, but admits that he is noticing his heart rate go up a lot lately. He states that it usually settles down if he rest a while. I will increase metoprolol to 75 mg daily from 50 mg daily. He will continue to follow up in the coumadin clinic as scheduled. Weight loss is recommended. He appears to be motivated to do so as he is going to the gym and avoiding sweets. Some weight gain may be muscle from work outs.

## 2012-08-24 NOTE — Assessment & Plan Note (Signed)
Will have labs completed in a couple of weeks for lipid status.  Most recent labs were completed in February of this year with TC 188, HDL 37, LDL 135. He continues on Zocor 40 mg daily and Fish Oil.

## 2012-08-24 NOTE — Assessment & Plan Note (Signed)
Will plan to have echocardiogram ordered on next visit to ascertain changes in LV fx. Continue current meds to include BB, and consider addition of ACE inhibitor as he is not on one at this time. Will check kidney fx in 2 weeks.

## 2012-08-24 NOTE — Progress Notes (Signed)
HPI: Mr. Brandon [name] is an obese male patient of Dr.Rothart we are seeing for ongoing assessment and treatment of permanent atrial fibrillation, coumadin per [name] office, LV dysfunction with LVEF of 35%-40%, CVA with left eye blindness, and carotid artery disease followed by Dr. Fabienne [name]. Left carotid is totally occluded.    The patient is without complaint today with the exception of weight gain. He is going to the gym daily and walking 30 minutes on the treadmill, doing some weight lifting, but not over 30 lbs, he has quit smoking for the last 6 months. He continues to feel his heart irregular at times, with increased HR that goes away when he rests. He denies bleeding issues or DOE.   Allergies  Allergen Reactions  . Other     Mayonnaise  . Penicillins Rash    Current Outpatient Prescriptions  Medication Sig Dispense Refill  . aspirin 81 MG tablet Take 81 mg by mouth daily.      . cholecalciferol (VITAMIN D) 1000 UNITS tablet Take 1,000 Units by mouth daily.      . furosemide (LASIX) 20 MG tablet TAKE ONE TABLET BY MOUTH EVERY DAY  30 tablet  5  . isosorbide mononitrate (IMDUR) 30 MG 24 hr tablet TAKE ONE TABLET BY MOUTH EVERY DAY  30 tablet  6  . metoprolol succinate (TOPROL-XL) 25 MG 24 hr tablet Take 3 tablets (75 mg total) by mouth daily. TAKE 3 (25MG  TABS) DAILY TO EQUAL 75MG  DAILY  90 tablet  5  . Omega-3 Fatty Acids (FISH OIL) 1000 MG CAPS Take 1,000 mg by mouth daily.      . simvastatin (ZOCOR) 20 MG tablet TAKE ONE TABLET BY MOUTH AT BEDTIME  30 tablet  2  . warfarin (COUMADIN) 10 MG tablet TAKE ONE & ONE-HALF TABLETS BY MOUTH EVERY DAY AS DIRECTED BY  ANTICOAGULATION  CLINIC  45 tablet  2    Past Medical History  Diagnosis Date  . Hyperlipidemia   . Gilbert's syndrome   . Vertigo   . Diabetes mellitus     diet controlled  . Angina   . Hypertension   . Atrial fibrillation   . Atrial flutter 2007    h/o  . Stroke ~08/17/11    "lost vision left eye"  . Blood  dyscrasia   . Coronary artery disease 08/19/11  . Peripheral vascular disease     Past Surgical History  Procedure Date  . Achilles tendon repair 06/2002    Left  . Cardioversion 2007    YNW:GNFAOZ of systems complete and found to be negative unless listed above  PHYSICAL EXAM BP 150/82  Pulse 128  Ht 6\' 1"  (1.854 m)  Wt 287 lb (130.182 kg)  BMI 37.86 kg/m2  General: Well developed, well nourished, in no acute distress Head: Eyes left eye does not react to light. , No xanthomas.   Normal cephalic and atramatic  Lungs: Clear bilaterally to auscultation and percussion. Heart: HRIR S1 S2, without MRG.  Pulses are 2+ & equal.            No carotid bruit. No JVD.  No abdominal bruits. No femoral bruits. Abdomen: Bowel sounds are positive, abdomen soft and non-tender without masses or                  Hernia's noted.Obese. Msk:  Back normal, normal gait. Normal strength and tone for age. Extremities: No clubbing, cyanosis or edema.  DP +1 Neuro: Alert and oriented X  3. Psych:  Good affect, responds appropriately  NWG:NFAOZH fibrillation rate of 128 bpm.  ASSESSMENT AND PLAN

## 2012-08-24 NOTE — Progress Notes (Deleted)
Name: BORDEN THUNE    DOB: 13-Aug-1956  Age: 56 y.o.  MR#: [id number]       PCP:  [name], MD      Insurance: @PAYORNAME @   CC:   No chief complaint on file.   VS BP 150/82  Pulse 128  Ht 6\' 1"  (1.854 m)  Wt 287 lb (130.182 kg)  BMI 37.86 kg/m2  Weights Current Weight  08/24/12 287 lb (130.182 kg)  01/25/12 270 lb (122.471 kg)  11/10/11 277 lb (125.646 kg)    Blood Pressure  BP Readings from Last 3 Encounters:  08/24/12 150/82  01/25/12 131/69  11/10/11 117/78     Admit date:  (Not on file) Last encounter with RMR:  08/22/2012   Allergy Allergies  Allergen Reactions  . Other     Mayonnaise  . Penicillins Rash    Current Outpatient Prescriptions  Medication Sig Dispense Refill  . aspirin 81 MG tablet Take 81 mg by mouth daily.      . cholecalciferol (VITAMIN D) 1000 UNITS tablet Take 1,000 Units by mouth daily.      . furosemide (LASIX) 20 MG tablet TAKE ONE TABLET BY MOUTH EVERY DAY  30 tablet  5  . isosorbide mononitrate (IMDUR) 30 MG 24 hr tablet TAKE ONE TABLET BY MOUTH EVERY DAY  30 tablet  6  . metoprolol succinate (TOPROL-XL) 50 MG 24 hr tablet Take 1 tablet (50 mg total) by mouth daily.  30 tablet  12  . Omega-3 Fatty Acids (FISH OIL) 1000 MG CAPS Take 1,000 mg by mouth daily.      . simvastatin (ZOCOR) 20 MG tablet TAKE ONE TABLET BY MOUTH AT BEDTIME  30 tablet  2  . warfarin (COUMADIN) 10 MG tablet TAKE ONE & ONE-HALF TABLETS BY MOUTH EVERY DAY AS DIRECTED BY  ANTICOAGULATION  CLINIC  45 tablet  2    Discontinued Meds:    Medications Discontinued During This Encounter  Medication Reason  . aspirin 325 MG tablet Error  . simvastatin (ZOCOR) 40 MG tablet Error    Patient Active Problem List  Diagnosis  . Diabetes mellitus type II  . DYSLIPIDEMIA  . Atrial fibrillation  . Carotid occlusion, left  . Ischemic cardiomyopathy  . Chronic anticoagulation  . Bilateral leg numbness    LABS Anti-coag visit on 08/23/2012  Component Date Value  . INR  08/23/2012 2.1   Anti-coag visit on 08/09/2012  Component Date Value  . INR 08/09/2012 3.3   Anti-coag visit on 07/26/2012  Component Date Value  . INR 07/26/2012 3.5   Anti-coag visit on 07/19/2012  Component Date Value  . INR 07/19/2012 4.4   Anti-coag visit on 06/21/2012  Component Date Value  . INR 06/21/2012 1.6   Anti-coag visit on 06/18/2012  Component Date Value  . INR 06/18/2012 5.1      Results for this Opt Visit:     Results for orders placed in visit on 08/23/12  POCT INR      Component Value Range   INR 2.1      EKG Orders placed in visit on 08/24/12  . EKG 12-LEAD     Prior Assessment and Plan Problem List as of 08/24/2012          Diabetes mellitus type II   DYSLIPIDEMIA   Last Assessment & Plan Note   11/01/2011 Office Visit Signed 11/01/2011  4:09 PM by Jodelle Gross, NP    Recent labs TC 188 TG 81, LDL  135.  I have increased his Zocor to 40 mg at HS. Follow-up lipids and LFTs will be completed in 3 months.    Atrial fibrillation   Last Assessment & Plan Note   01/25/2012 Office Visit Signed 01/25/2012 11:33 AM by Dyann Kief, PA    Rate controlled. Patient thinks he may be going in and out of of atrial fibrillation at times. He is on Coumadin.    Carotid occlusion, left   Last Assessment & Plan Note   01/25/2012 Office Visit Signed 01/25/2012 11:34 AM by Dyann Kief, PA    Patient needs follow-up of her right carotid stenosis in 6 months.no need for follow-up Doppler of his left carotid according to Dr. Darrick Penna.    Ischemic cardiomyopathy   Last Assessment & Plan Note   01/25/2012 Office Visit Signed 01/25/2012 11:33 AM by Dyann Kief, PA    Patient is compensated today. He is feeling much better since he began exercising 3 days a week.    Chronic anticoagulation   Bilateral leg numbness   Last Assessment & Plan Note   10/24/2011 Office Visit Signed 10/24/2011  1:09 PM by Jodelle Gross, NP    I have checked his DP and PT pulses  by palpation and found them difficult to locate. I used the doppler and was able to auscultate his pulses but sounded distant. Will atrial fib no consistent sound was heard. I will have bilateral ABI with ultrasound completed for better evaluation of this. He is to continue on current medications as directed.        Imaging: No results found.   FRS Calculation: Score not calculated. Missing: Total Cholesterol

## 2012-08-24 NOTE — Patient Instructions (Addendum)
Your physician recommends that you schedule a follow-up appointment in: 6 MONTHS WITH KL/ML/RR  ,Your physician recommends that you return for lab work in: 2 WEEKS (CMET,LIPIDS,CBC,TSH,HGBA1C) SLIPS GIVEN  Your physician has recommended you make the following change in your medication:   1) INCREASE METOPROLOL TO 75MG  DAILY (TAKE 3 TABS OF THE 25MG  TABS DAILY)

## 2012-09-13 ENCOUNTER — Telehealth: Payer: Self-pay | Admitting: *Deleted

## 2012-09-13 ENCOUNTER — Encounter: Payer: Self-pay | Admitting: *Deleted

## 2012-09-13 DIAGNOSIS — E785 Hyperlipidemia, unspecified: Secondary | ICD-10-CM

## 2012-09-13 DIAGNOSIS — E079 Disorder of thyroid, unspecified: Secondary | ICD-10-CM

## 2012-09-13 DIAGNOSIS — E119 Type 2 diabetes mellitus without complications: Secondary | ICD-10-CM

## 2012-09-13 DIAGNOSIS — I10 Essential (primary) hypertension: Secondary | ICD-10-CM

## 2012-09-13 NOTE — Telephone Encounter (Signed)
Noted pt did not have labs drawn as advised in last OV, mailed lab slips to pt with letter to advise overdue, will place reminder in chart to verify if pt had labs drawn after second reminder

## 2012-09-20 ENCOUNTER — Ambulatory Visit (INDEPENDENT_AMBULATORY_CARE_PROVIDER_SITE_OTHER): Payer: BC Managed Care – PPO | Admitting: *Deleted

## 2012-09-20 DIAGNOSIS — I4891 Unspecified atrial fibrillation: Secondary | ICD-10-CM

## 2012-09-20 DIAGNOSIS — I639 Cerebral infarction, unspecified: Secondary | ICD-10-CM

## 2012-09-20 DIAGNOSIS — Z7901 Long term (current) use of anticoagulants: Secondary | ICD-10-CM

## 2012-09-20 DIAGNOSIS — I6350 Cerebral infarction due to unspecified occlusion or stenosis of unspecified cerebral artery: Secondary | ICD-10-CM

## 2012-09-20 LAB — POCT INR: INR: 3.5

## 2012-09-27 ENCOUNTER — Telehealth: Payer: Self-pay | Admitting: *Deleted

## 2012-09-27 NOTE — Telephone Encounter (Signed)
Noted pt did not have labs drawn as directed via apt nor letter/slips mailed

## 2012-10-11 ENCOUNTER — Ambulatory Visit (INDEPENDENT_AMBULATORY_CARE_PROVIDER_SITE_OTHER): Payer: BC Managed Care – PPO | Admitting: *Deleted

## 2012-10-11 DIAGNOSIS — I639 Cerebral infarction, unspecified: Secondary | ICD-10-CM

## 2012-10-11 DIAGNOSIS — I4891 Unspecified atrial fibrillation: Secondary | ICD-10-CM

## 2012-10-11 DIAGNOSIS — I6350 Cerebral infarction due to unspecified occlusion or stenosis of unspecified cerebral artery: Secondary | ICD-10-CM

## 2012-10-11 DIAGNOSIS — Z7901 Long term (current) use of anticoagulants: Secondary | ICD-10-CM

## 2012-10-11 LAB — POCT INR: INR: 1.5

## 2012-10-24 ENCOUNTER — Ambulatory Visit (INDEPENDENT_AMBULATORY_CARE_PROVIDER_SITE_OTHER): Payer: BC Managed Care – PPO | Admitting: *Deleted

## 2012-10-24 DIAGNOSIS — Z7901 Long term (current) use of anticoagulants: Secondary | ICD-10-CM

## 2012-10-24 DIAGNOSIS — I6350 Cerebral infarction due to unspecified occlusion or stenosis of unspecified cerebral artery: Secondary | ICD-10-CM

## 2012-10-24 DIAGNOSIS — I639 Cerebral infarction, unspecified: Secondary | ICD-10-CM

## 2012-10-24 DIAGNOSIS — I4891 Unspecified atrial fibrillation: Secondary | ICD-10-CM

## 2012-10-24 LAB — POCT INR: INR: 4.5

## 2012-11-03 ENCOUNTER — Other Ambulatory Visit: Payer: Self-pay | Admitting: Adult Health

## 2012-11-15 ENCOUNTER — Other Ambulatory Visit: Payer: BC Managed Care – PPO

## 2012-11-15 ENCOUNTER — Ambulatory Visit: Payer: BC Managed Care – PPO | Admitting: Neurosurgery

## 2012-12-24 ENCOUNTER — Other Ambulatory Visit (INDEPENDENT_AMBULATORY_CARE_PROVIDER_SITE_OTHER): Payer: BC Managed Care – PPO | Admitting: *Deleted

## 2012-12-24 ENCOUNTER — Other Ambulatory Visit: Payer: Self-pay | Admitting: Adult Health

## 2012-12-24 DIAGNOSIS — I6529 Occlusion and stenosis of unspecified carotid artery: Secondary | ICD-10-CM

## 2012-12-26 ENCOUNTER — Other Ambulatory Visit: Payer: Self-pay | Admitting: *Deleted

## 2012-12-27 ENCOUNTER — Encounter: Payer: Self-pay | Admitting: Vascular Surgery

## 2013-01-30 ENCOUNTER — Other Ambulatory Visit: Payer: Self-pay | Admitting: Adult Health

## 2013-02-28 ENCOUNTER — Ambulatory Visit (INDEPENDENT_AMBULATORY_CARE_PROVIDER_SITE_OTHER): Payer: BC Managed Care – PPO | Admitting: *Deleted

## 2013-02-28 DIAGNOSIS — Z7901 Long term (current) use of anticoagulants: Secondary | ICD-10-CM

## 2013-02-28 DIAGNOSIS — I4891 Unspecified atrial fibrillation: Secondary | ICD-10-CM

## 2013-02-28 DIAGNOSIS — I6350 Cerebral infarction due to unspecified occlusion or stenosis of unspecified cerebral artery: Secondary | ICD-10-CM

## 2013-02-28 DIAGNOSIS — I639 Cerebral infarction, unspecified: Secondary | ICD-10-CM

## 2013-03-04 ENCOUNTER — Other Ambulatory Visit: Payer: Self-pay | Admitting: *Deleted

## 2013-03-04 MED ORDER — ISOSORBIDE MONONITRATE ER 30 MG PO TB24: ORAL_TABLET | ORAL | Status: DC

## 2013-03-13 ENCOUNTER — Telehealth: Payer: Self-pay | Admitting: *Deleted

## 2013-03-13 MED ORDER — WARFARIN SODIUM 10 MG PO TABS: 10.0000 mg | ORAL_TABLET | Freq: Every day | ORAL | Status: DC

## 2013-03-13 NOTE — Telephone Encounter (Signed)
Incoming fax of patient medication refill request noted per patient pharmacy. For coumadin 10mg  [facility] [place] rx sent to pharmacy by e-script

## 2013-03-14 ENCOUNTER — Ambulatory Visit (INDEPENDENT_AMBULATORY_CARE_PROVIDER_SITE_OTHER): Payer: BC Managed Care – PPO | Admitting: *Deleted

## 2013-03-14 DIAGNOSIS — Z7901 Long term (current) use of anticoagulants: Secondary | ICD-10-CM

## 2013-03-14 DIAGNOSIS — I639 Cerebral infarction, unspecified: Secondary | ICD-10-CM

## 2013-03-14 DIAGNOSIS — I4891 Unspecified atrial fibrillation: Secondary | ICD-10-CM

## 2013-03-14 DIAGNOSIS — I6350 Cerebral infarction due to unspecified occlusion or stenosis of unspecified cerebral artery: Secondary | ICD-10-CM

## 2013-03-18 ENCOUNTER — Other Ambulatory Visit: Payer: Self-pay | Admitting: *Deleted

## 2013-03-18 MED ORDER — WARFARIN SODIUM 10 MG PO TABS: 10.0000 mg | ORAL_TABLET | Freq: Every day | ORAL | Status: DC

## 2013-03-21 ENCOUNTER — Ambulatory Visit (INDEPENDENT_AMBULATORY_CARE_PROVIDER_SITE_OTHER): Payer: BC Managed Care – PPO | Admitting: Adult Health

## 2013-03-21 ENCOUNTER — Encounter: Payer: Self-pay | Admitting: Adult Health

## 2013-03-21 VITALS — BP 132/70 | HR 132 | Ht 73.0 in | Wt 294.0 lb

## 2013-03-21 DIAGNOSIS — I10 Essential (primary) hypertension: Secondary | ICD-10-CM

## 2013-03-21 DIAGNOSIS — I2589 Other forms of chronic ischemic heart disease: Secondary | ICD-10-CM

## 2013-03-21 DIAGNOSIS — I4891 Unspecified atrial fibrillation: Secondary | ICD-10-CM

## 2013-03-21 MED ORDER — METOPROLOL SUCCINATE ER 50 MG PO TB24: ORAL_TABLET | ORAL | Status: DC

## 2013-03-21 MED ORDER — LISINOPRIL 2.5 MG PO TABS: 2.5000 mg | ORAL_TABLET | Freq: Every day | ORAL | Status: DC

## 2013-03-21 NOTE — Progress Notes (Signed)
Name: [name]    DOB: [birth year]  Age: 57 y.o.  MR#: [id number]       PCP:  [name], MD      Insurance: Payor: BLUE CROSS BLUE SHIELD / Plan: [employer] PPO / Product Type: *No Product type* /   CC:    Chief Complaint  Patient presents with  . Hypertension  . Cardiomyopathy    VS Filed Vitals:   03/21/13 1332  BP: 132/70  Pulse: 132  Height: 6\' 1"  (1.854 m)  Weight: 294 lb (133.358 kg)    Weights Current Weight  03/21/13 294 lb (133.358 kg)  08/24/12 287 lb (130.182 kg)  01/25/12 270 lb (122.471 kg)    Blood Pressure  BP Readings from Last 3 Encounters:  03/21/13 132/70  08/24/12 150/82  01/25/12 131/69     Admit date:  (Not on file) Last encounter with RMR:  01/30/2013   Allergy Other and Penicillins  Current Outpatient Prescriptions  Medication Sig Dispense Refill  . aspirin 81 MG tablet Take 81 mg by mouth daily.      . cholecalciferol (VITAMIN D) 1000 UNITS tablet Take 1,000 Units by mouth daily.      . furosemide (LASIX) 20 MG tablet TAKE ONE TABLET BY MOUTH EVERY DAY  30 tablet  5  . isosorbide mononitrate (IMDUR) 30 MG 24 hr tablet TAKE ONE TABLET BY MOUTH EVERY DAY  30 tablet  2  . metoprolol succinate (TOPROL-XL) 50 MG 24 hr tablet Take 50 mg by mouth daily. Take with or immediately following a meal.      . Omega-3 Fatty Acids (FISH OIL) 1000 MG CAPS Take 1,000 mg by mouth daily.      . simvastatin (ZOCOR) 20 MG tablet TAKE ONE TABLET BY MOUTH AT BEDTIME  30 tablet  3  . warfarin (COUMADIN) 10 MG tablet Take 1 tablet (10 mg total) by mouth daily.  30 tablet  3   No current facility-administered medications for this visit.    Discontinued Meds:    Medications Discontinued During This Encounter  Medication Reason  . metoprolol succinate (TOPROL-XL) 25 MG 24 hr tablet Completed Course    Patient Active Problem List   Diagnosis Date Noted  . Bilateral leg numbness 10/24/2011  . Chronic anticoagulation 09/26/2011  . Ischemic  cardiomyopathy 08/25/2011  . Diabetes mellitus type II 02/10/2009  . DYSLIPIDEMIA 02/10/2009  . Atrial fibrillation 02/10/2009  . Carotid occlusion, left 02/10/2009    LABS    Component Value Date/Time   NA 137 08/17/2011 1231   NA 140 04/20/2011 1041   NA 142 08/20/2010 1422   K 4.0 08/17/2011 1231   K 4.8 04/20/2011 1041   K 4.2 08/20/2010 1422   CL 106 08/17/2011 1231   CL 105 04/20/2011 1041   CL 106 08/20/2010 1422   CO2 22 08/17/2011 1231   CO2 24 04/20/2011 1041   CO2 28 08/20/2010 1422   GLUCOSE 94 08/17/2011 1231   GLUCOSE 103* 04/20/2011 1041   GLUCOSE 87 08/20/2010 1422   BUN 18 08/17/2011 1231   BUN 20 04/20/2011 1041   BUN 10 08/20/2010 1422   CREATININE 1.13 08/17/2011 1231   CREATININE 0.90 04/20/2011 1041   CREATININE 1.28 08/20/2010 1422   CALCIUM 9.8 08/17/2011 1231   CALCIUM 10.2 04/20/2011 1041   CALCIUM 10.1 08/20/2010 1422   GFRNONAA 71* 08/17/2011 1231   GFRNONAA >60 04/20/2011 1041   GFRNONAA 59* 08/20/2010 1422   GFRAA 83* 08/17/2011 1231  GFRAA >60 04/20/2011 1041   GFRAA  Value: >60        The eGFR has been calculated using the MDRD equation. This calculation has not been validated in all clinical situations. eGFR's persistently <60 mL/min signify possible Chronic Kidney Disease. 08/20/2010 1422   CMP     Component Value Date/Time   NA 137 08/17/2011 1231   K 4.0 08/17/2011 1231   CL 106 08/17/2011 1231   CO2 22 08/17/2011 1231   GLUCOSE 94 08/17/2011 1231   BUN 18 08/17/2011 1231   CREATININE 1.13 08/17/2011 1231   CALCIUM 9.8 08/17/2011 1231   PROT 7.3 10/27/2011 1015   ALBUMIN 4.3 10/27/2011 1015   AST 19 10/27/2011 1015   ALT 25 10/27/2011 1015   ALKPHOS 52 10/27/2011 1015   BILITOT 1.3* 10/27/2011 1015   GFRNONAA 71* 08/17/2011 1231   GFRAA 83* 08/17/2011 1231       Component Value Date/Time   WBC 8.1 08/20/2011 0600   WBC 9.0 08/19/2011 0123   WBC 8.0 08/18/2011 0633   HGB 15.5 08/20/2011 0600   HGB 15.3 08/19/2011 0123   HGB 15.6  08/18/2011 0633   HCT 44.8 08/20/2011 0600   HCT 44.0 08/19/2011 0123   HCT 45.9 08/18/2011 0633   MCV 88.9 08/20/2011 0600   MCV 89.1 08/19/2011 0123   MCV 88.6 08/18/2011 0633    Lipid Panel     Component Value Date/Time   CHOL 188 10/27/2011 1015   TRIG 81 10/27/2011 1015   HDL 37* 10/27/2011 1015   CHOLHDL 5.1 10/27/2011 1015   VLDL 16 10/27/2011 1015   LDLCALC 135* 10/27/2011 1015    ABG No results found for this basename: phart, pco2, pco2art, po2, po2art, hco3, tco2, acidbasedef, o2sat      BNP (last 3 results) No results found for this basename: PROBNP,  in the last 8760 hours Cardiac Panel (last 3 results) No results found for this basename: CKTOTAL, CKMB, TROPONINI, RELINDX,  in the last 72 hours  Iron/TIBC/Ferritin No results found for this basename: iron, tibc, ferritin     EKG Orders placed in visit on 03/21/13  . EKG 12-LEAD     Prior Assessment and Plan Problem List as of 03/21/2013     Cardiovascular and Mediastinum   Atrial fibrillation   Last Assessment & Plan   08/24/2012 Office Visit Written 08/24/2012  3:25 PM by Jodelle Gross, NP     Heart rate is not well controlled. He said he is nervous about coming to appointment today, but admits that he is noticing his heart rate go up a lot lately. He states that it usually settles down if he rest a while. I will increase metoprolol to 75 mg daily from 50 mg daily. He will continue to follow up in the coumadin clinic as scheduled. Weight loss is recommended. He appears to be motivated to do so as he is going to the gym and avoiding sweets. Some weight gain may be muscle from work outs.    Carotid occlusion, left   Last Assessment & Plan   01/25/2012 Office Visit Written 01/25/2012 11:34 AM by Dyann Kief, PA     Patient needs follow-up of her right carotid stenosis in 6 months.no need for follow-up Doppler of his left carotid according to Dr. Darrick Penna.    Ischemic cardiomyopathy   Last Assessment & Plan    08/24/2012 Office Visit Written 08/24/2012  3:29 PM by Jodelle Gross, NP  Will plan to have echocardiogram ordered on next visit to ascertain changes in LV fx. Continue current meds to include BB, and consider addition of ACE inhibitor as he is not on one at this time. Will check kidney fx in 2 weeks.       Endocrine   Diabetes mellitus type II     Other   DYSLIPIDEMIA   Last Assessment & Plan   08/24/2012 Office Visit Written 08/24/2012  3:27 PM by Jodelle Gross, NP     Will have labs completed in a couple of weeks for lipid status.  Most recent labs were completed in February of this year with TC 188, HDL 37, LDL 135. He continues on Zocor 40 mg daily and Fish Oil.     Chronic anticoagulation   Bilateral leg numbness   Last Assessment & Plan   10/24/2011 Office Visit Written 10/24/2011  1:09 PM by Jodelle Gross, NP     I have checked his DP and PT pulses by palpation and found them difficult to locate. I used the doppler and was able to auscultate his pulses but sounded distant. Will atrial fib no consistent sound was heard. I will have bilateral ABI with ultrasound completed for better evaluation of this. He is to continue on current medications as directed.        Imaging: No results found.

## 2013-03-21 NOTE — Assessment & Plan Note (Signed)
Fasting lipids and LFT's will be drawn., Continue statin and low cholesterol diet.

## 2013-03-21 NOTE — Progress Notes (Signed)
HPI: [name] is a 57 year old patient of Dr. Dietrich Pates nursing for ongoing assessment and management of permanent atrial fibrillation, on Coumadin per recent office, with history of LV dysfunction secondary to ischemic heart myopathy with last EF of 35-40%, CVA with left eye blindness and carotid artery disease followed by Dr. Darrick Penna. Left ICA is completely occluded. He was last seen on 08/24/2012, was found that his heart rate is now well controlled, but Toprol was increased to 75 mg daily from 50 mg daily. He was continued on Coumadin.       Echocardiogram will be ordered on this visit to assess for changes in LV function. Consideration for addition of ACE inhibitor if blood pressure remains elevated with a history of ischemic cardiomyopathy. Has recent labs to check cholesterol status were completed February 2013.    He comes today without complaint with the exception of wt gain, He did not go up on his metoprolol dose to 75 mg daily. He denies chest pain, but does have some LEE. He states that the lasix is producing a lot of diureses. He admits to some dietary indiscretion with salt. Allergies  Allergen Reactions  . Other     Mayonnaise  . Penicillins Rash    Current Outpatient Prescriptions  Medication Sig Dispense Refill  . aspirin 81 MG tablet Take 81 mg by mouth daily.      . cholecalciferol (VITAMIN D) 1000 UNITS tablet Take 1,000 Units by mouth daily.      . furosemide (LASIX) 20 MG tablet TAKE ONE TABLET BY MOUTH EVERY DAY  30 tablet  5  . isosorbide mononitrate (IMDUR) 30 MG 24 hr tablet TAKE ONE TABLET BY MOUTH EVERY DAY  30 tablet  2  . metoprolol succinate (TOPROL-XL) 50 MG 24 hr tablet Take 50 mg by mouth daily. Take with or immediately following a meal.      . Omega-3 Fatty Acids (FISH OIL) 1000 MG CAPS Take 1,000 mg by mouth daily.      . simvastatin (ZOCOR) 20 MG tablet TAKE ONE TABLET BY MOUTH AT BEDTIME  30 tablet  3  . warfarin (COUMADIN) 10 MG tablet Take 1 tablet (10  mg total) by mouth daily.  30 tablet  3   No current facility-administered medications for this visit.    Past Medical History  Diagnosis Date  . Hyperlipidemia   . Gilbert's syndrome   . Vertigo   . Diabetes mellitus     diet controlled  . Angina   . Hypertension   . Atrial fibrillation   . Atrial flutter 2007    h/o  . Stroke ~08/17/11    "lost vision left eye"  . Blood dyscrasia   . Coronary artery disease 08/19/11  . Peripheral vascular disease     Past Surgical History  Procedure Laterality Date  . Achilles tendon repair  06/2002    Left  . Cardioversion  2007    ROS: Review of systems complete and found to be negative unless listed above  PHYSICAL EXAM BP 132/70  Pulse 132  Ht 6\' 1"  (1.854 m)  Wt 294 lb (133.358 kg)  BMI 38.8 kg/m2  General: Well developed, well nourished, in no acute distress Head: Eyes PERRLA, No xanthomas.   Normal cephalic and atramatic  Lungs: Clear bilaterally to auscultation and percussion. Heart: HRRR S1 S2, irregular, tachycardic, without MRG.  Pulses are 2+ & equal.            Right carotid bruit. No  JVD.  No abdominal bruits. No femoral bruits. Abdomen: Bowel sounds are positive, abdomen soft mild ballotment notedand non-tender without masses or                  Hernia's noted. Msk:  Back normal, normal gait. Normal strength and tone for age. Extremities: No clubbing, cyanosis, positive  edema.  DP +1 Neuro: Alert and oriented X 3. Psych:  Good affect, responds appropriately  ZOX:[id number] fibrillation rate of 132 bpm  ASSESSMENT AND PLAN

## 2013-03-21 NOTE — Patient Instructions (Addendum)
Your physician recommends that you schedule a follow-up appointment in: 1 month   Your physician has requested that you have an echocardiogram. Echocardiography is a painless test that uses sound waves to create images of your heart. It provides your doctor with information about the size and shape of your heart and how well your heart's chambers and valves are working. This procedure takes approximately one hour. There are no restrictions for this procedure.  Your physician recommends that you return for lab work this week for BMET, Pro-BNP, Fasting Lipids. Slips given  Your physician has recommended you make the following change in your medication:  1. Increase Metoprolol to 75 mg daily. Take 1 1/2 tab of 50 mg 2. Increase Lasix to 40 mg daily for 2 days, then go BACK to 20 mg daily.  Your physician recommends you start a low salt diet.

## 2013-03-21 NOTE — Assessment & Plan Note (Signed)
He states that he cannot feel that his HR is elevated presently.  He did not go up on his metoprolol as directed. I will increase this again to 75 mg daily. Consider adding digoxin to his medication regimen should this not control HR if BP becomes a problem.  He will have BMET drawn today.

## 2013-03-21 NOTE — Assessment & Plan Note (Signed)
Plan echocardiogram for LV fx. Will add low dose ACE inhibitor 2.5 mg daily. Labs this week. Follow up in one month.

## 2013-03-29 ENCOUNTER — Ambulatory Visit (HOSPITAL_COMMUNITY)
Admission: RE | Admit: 2013-03-29 | Discharge: 2013-03-29 | Disposition: A | Discharge status: Home / Self Care | Payer: BC Managed Care – PPO | Source: Ambulatory Visit | Attending: Family Medicine | Admitting: Family Medicine

## 2013-03-29 DIAGNOSIS — E119 Type 2 diabetes mellitus without complications: Secondary | ICD-10-CM | POA: Insufficient documentation

## 2013-03-29 DIAGNOSIS — I2589 Other forms of chronic ischemic heart disease: Secondary | ICD-10-CM

## 2013-03-29 DIAGNOSIS — I10 Essential (primary) hypertension: Secondary | ICD-10-CM

## 2013-03-29 DIAGNOSIS — E785 Hyperlipidemia, unspecified: Secondary | ICD-10-CM | POA: Insufficient documentation

## 2013-03-29 DIAGNOSIS — I4891 Unspecified atrial fibrillation: Secondary | ICD-10-CM

## 2013-03-29 NOTE — Progress Notes (Signed)
*  PRELIMINARY RESULTS* Echocardiogram 2D Echocardiogram has been performed.  [name] 03/29/2013, 2:25 PM

## 2013-04-01 ENCOUNTER — Other Ambulatory Visit: Payer: Self-pay | Admitting: *Deleted

## 2013-04-01 MED ORDER — FUROSEMIDE 20 MG PO TABS: ORAL_TABLET | ORAL | Status: DC

## 2013-04-08 ENCOUNTER — Ambulatory Visit (INDEPENDENT_AMBULATORY_CARE_PROVIDER_SITE_OTHER): Payer: Self-pay | Admitting: *Deleted

## 2013-04-08 DIAGNOSIS — I639 Cerebral infarction, unspecified: Secondary | ICD-10-CM

## 2013-04-08 DIAGNOSIS — Z7901 Long term (current) use of anticoagulants: Secondary | ICD-10-CM

## 2013-04-08 DIAGNOSIS — I4891 Unspecified atrial fibrillation: Secondary | ICD-10-CM

## 2013-04-08 DIAGNOSIS — I6350 Cerebral infarction due to unspecified occlusion or stenosis of unspecified cerebral artery: Secondary | ICD-10-CM

## 2013-04-23 ENCOUNTER — Ambulatory Visit (INDEPENDENT_AMBULATORY_CARE_PROVIDER_SITE_OTHER): Payer: Self-pay | Admitting: Adult Health

## 2013-04-23 ENCOUNTER — Encounter: Payer: Self-pay | Admitting: Adult Health

## 2013-04-23 VITALS — BP 140/82 | HR 104 | Ht 73.0 in | Wt 291.0 lb

## 2013-04-23 DIAGNOSIS — I6522 Occlusion and stenosis of left carotid artery: Secondary | ICD-10-CM

## 2013-04-23 DIAGNOSIS — I2589 Other forms of chronic ischemic heart disease: Secondary | ICD-10-CM

## 2013-04-23 DIAGNOSIS — I255 Ischemic cardiomyopathy: Secondary | ICD-10-CM

## 2013-04-23 DIAGNOSIS — I6529 Occlusion and stenosis of unspecified carotid artery: Secondary | ICD-10-CM

## 2013-04-23 DIAGNOSIS — I4891 Unspecified atrial fibrillation: Secondary | ICD-10-CM

## 2013-04-23 DIAGNOSIS — I10 Essential (primary) hypertension: Secondary | ICD-10-CM

## 2013-04-23 MED ORDER — METOPROLOL SUCCINATE ER 100 MG PO TB24: 100.0000 mg | ORAL_TABLET | Freq: Every day | ORAL | Status: DC

## 2013-04-23 NOTE — Progress Notes (Signed)
HPI: [name] is a 57 year old patient of [name] we are following for ongoing assessment and management of permanent atrial fibrillation, on Coumadin therapy, with history of LV dysfunction secondary to ischemic heart myopathy with last EF 35-40%, history of a CVA with left eye blind, carotid artery disease followed by [name] with left ICA completely occluded. He was last seen in the office on 03/21/2013, had not increased his dose of metoprolol to 75 daily as directed, admitted to some dietary indiscretion with salt, and had some complaints of lower extremity edema.   He was restarted on metoprolol 75 mg daily from 50 mg daily, and her mind to take the higher dose. He was to continue in the Coumadin. Her echocardiogram was planned. This was completed on 03/29/2013 demonstrating systolic function mildly to moderately reduced with an EF of 40-45%, diffuse hypokinesis. The left atrium was moderately dilated the right atrium was mildly dilated. The mitral valve had mildly thickened leaflets.   He comes today without complaints with the exception of his HR. He has noticed that it is higher most of the time,despite the higher dose of BB on last visit. He is medically compliant, does not drink caffeine. He is not working out as much as he wants to. Now only once a week. He is requesting to go back to cardiac rehab.  Allergies  Allergen Reactions  . Other     Mayonnaise  . Penicillins Rash    Current Outpatient Prescriptions  Medication Sig Dispense Refill  . aspirin 81 MG tablet Take 81 mg by mouth daily.      . cholecalciferol (VITAMIN D) 1000 UNITS tablet Take 1,000 Units by mouth daily.      . furosemide (LASIX) 20 MG tablet TAKE ONE TABLET BY MOUTH EVERY DAY  30 tablet  5  . isosorbide mononitrate (IMDUR) 30 MG 24 hr tablet TAKE ONE TABLET BY MOUTH EVERY DAY  30 tablet  2  . lisinopril (PRINIVIL,ZESTRIL) 2.5 MG tablet Take 1 tablet (2.5 mg total) by mouth daily.  90 tablet  3  .  metoprolol succinate (TOPROL-XL) 50 MG 24 hr tablet Take 1 1/2 tablets of 50 mg to equal 75 mg.  45 tablet  6  . Omega-3 Fatty Acids (FISH OIL) 1000 MG CAPS Take 1,000 mg by mouth daily.      . simvastatin (ZOCOR) 20 MG tablet TAKE ONE TABLET BY MOUTH AT BEDTIME  30 tablet  3  . warfarin (COUMADIN) 10 MG tablet Take 1 tablet (10 mg total) by mouth daily.  30 tablet  3   No current facility-administered medications for this visit.    Past Medical History  Diagnosis Date  . Hyperlipidemia   . Gilbert's syndrome   . Vertigo   . Diabetes mellitus     diet controlled  . Angina   . Hypertension   . Atrial fibrillation   . Atrial flutter 2007    h/o  . Stroke ~08/17/11    "lost vision left eye"  . Blood dyscrasia   . Coronary artery disease 08/19/11  . Peripheral vascular disease     Past Surgical History  Procedure Laterality Date  . Achilles tendon repair  06/2002    Left  . Cardioversion  2007    WUJ:[id number] of systems complete and found to be negative unless listed above  PHYSICAL EXAM BP 140/82  Pulse 104  Ht 6\' 1"  (1.854 m)  Wt 291 lb (131.997 kg)  BMI 38.4 kg/m2  General: Well developed, well nourished, in no acute distress Head: Eyes PERRLA, No xanthomas.   Normal cephalic and atramatic  Lungs: Clear bilaterally to auscultation no wheezes or rhonchi. Heart: HRIR S1 S2,tachycardic without MRG.  Pulses are 2+ & equal.            No carotid bruit. No JVD.  No abdominal bruits. No femoral bruits. Abdomen: Bowel sounds are positive, abdomen soft and non-tender without masses or                  Hernia's noted. Msk:  Back normal, normal gait. Normal strength and tone for age. Extremities: No clubbing, cyanosis or edema.  DP +1 Neuro: Alert and oriented X 3. Psych:  Good affect, responds appropriately  EKG:  ASSESSMENT AND PLAN

## 2013-04-23 NOTE — Progress Notes (Deleted)
Name: [name]    DOB: [birth year]  Age: 57 y.o.  MR#: [id number]       PCP:  [name], MD      Insurance: Payor: BLUE CROSS BLUE SHIELD / Plan: BCBS [place] PPO / Product Type: *No Product type* /   CC:    Chief Complaint  Patient presents with  . Atrial Fibrillation  . Hypertension    VS Filed Vitals:   04/23/13 1336  BP: 140/82  Pulse: 104  Height: 6\' 1"  (1.854 m)  Weight: 291 lb (131.997 kg)    Weights Current Weight  04/23/13 291 lb (131.997 kg)  03/21/13 294 lb (133.358 kg)  08/24/12 287 lb (130.182 kg)    Blood Pressure  BP Readings from Last 3 Encounters:  04/23/13 140/82  03/21/13 132/70  08/24/12 150/82     Admit date:  (Not on file) Last encounter with RMR:  03/21/2013   Allergy Other and Penicillins  Current Outpatient Prescriptions  Medication Sig Dispense Refill  . aspirin 81 MG tablet Take 81 mg by mouth daily.      . cholecalciferol (VITAMIN D) 1000 UNITS tablet Take 1,000 Units by mouth daily.      . furosemide (LASIX) 20 MG tablet TAKE ONE TABLET BY MOUTH EVERY DAY  30 tablet  5  . isosorbide mononitrate (IMDUR) 30 MG 24 hr tablet TAKE ONE TABLET BY MOUTH EVERY DAY  30 tablet  2  . lisinopril (PRINIVIL,ZESTRIL) 2.5 MG tablet Take 1 tablet (2.5 mg total) by mouth daily.  90 tablet  3  . metoprolol succinate (TOPROL-XL) 50 MG 24 hr tablet Take 1 1/2 tablets of 50 mg to equal 75 mg.  45 tablet  6  . Omega-3 Fatty Acids (FISH OIL) 1000 MG CAPS Take 1,000 mg by mouth daily.      . simvastatin (ZOCOR) 20 MG tablet TAKE ONE TABLET BY MOUTH AT BEDTIME  30 tablet  3  . warfarin (COUMADIN) 10 MG tablet Take 1 tablet (10 mg total) by mouth daily.  30 tablet  3   No current facility-administered medications for this visit.    Discontinued Meds:   There are no discontinued medications.  Patient Active Problem List   Diagnosis Date Noted  . Bilateral leg numbness 10/24/2011  . Chronic anticoagulation 09/26/2011  . Ischemic cardiomyopathy 08/25/2011  .  Diabetes mellitus type II 02/10/2009  . DYSLIPIDEMIA 02/10/2009  . Atrial fibrillation 02/10/2009  . Carotid occlusion, left 02/10/2009    LABS    Component Value Date/Time   NA 137 08/17/2011 1231   NA 140 04/20/2011 1041   NA 142 08/20/2010 1422   K 4.0 08/17/2011 1231   K 4.8 04/20/2011 1041   K 4.2 08/20/2010 1422   CL 106 08/17/2011 1231   CL 105 04/20/2011 1041   CL 106 08/20/2010 1422   CO2 22 08/17/2011 1231   CO2 24 04/20/2011 1041   CO2 28 08/20/2010 1422   GLUCOSE 94 08/17/2011 1231   GLUCOSE 103* 04/20/2011 1041   GLUCOSE 87 08/20/2010 1422   BUN 18 08/17/2011 1231   BUN 20 04/20/2011 1041   BUN 10 08/20/2010 1422   CREATININE 1.13 08/17/2011 1231   CREATININE 0.90 04/20/2011 1041   CREATININE 1.28 08/20/2010 1422   CALCIUM 9.8 08/17/2011 1231   CALCIUM 10.2 04/20/2011 1041   CALCIUM 10.1 08/20/2010 1422   GFRNONAA 71* 08/17/2011 1231   GFRNONAA >60 04/20/2011 1041   GFRNONAA 59* 08/20/2010 1422   GFRAA 83*  08/17/2011 1231   GFRAA >60 04/20/2011 1041   GFRAA  Value: >60        The eGFR has been calculated using the MDRD equation. This calculation has not been validated in all clinical situations. eGFR's persistently <60 mL/min signify possible Chronic Kidney Disease. 08/20/2010 1422   CMP     Component Value Date/Time   NA 137 08/17/2011 1231   K 4.0 08/17/2011 1231   CL 106 08/17/2011 1231   CO2 22 08/17/2011 1231   GLUCOSE 94 08/17/2011 1231   BUN 18 08/17/2011 1231   CREATININE 1.13 08/17/2011 1231   CALCIUM 9.8 08/17/2011 1231   PROT 7.3 10/27/2011 1015   ALBUMIN 4.3 10/27/2011 1015   AST 19 10/27/2011 1015   ALT 25 10/27/2011 1015   ALKPHOS 52 10/27/2011 1015   BILITOT 1.3* 10/27/2011 1015   GFRNONAA 71* 08/17/2011 1231   GFRAA 83* 08/17/2011 1231       Component Value Date/Time   WBC 8.1 08/20/2011 0600   WBC 9.0 08/19/2011 0123   WBC 8.0 08/18/2011 0633   HGB 15.5 08/20/2011 0600   HGB 15.3 08/19/2011 0123   HGB 15.6 08/18/2011 0633   HCT 44.8  08/20/2011 0600   HCT 44.0 08/19/2011 0123   HCT 45.9 08/18/2011 0633   MCV 88.9 08/20/2011 0600   MCV 89.1 08/19/2011 0123   MCV 88.6 08/18/2011 0633    Lipid Panel     Component Value Date/Time   CHOL 188 10/27/2011 1015   TRIG 81 10/27/2011 1015   HDL 37* 10/27/2011 1015   CHOLHDL 5.1 10/27/2011 1015   VLDL 16 10/27/2011 1015   LDLCALC 135* 10/27/2011 1015    ABG No results found for this basename: phart, pco2, pco2art, po2, po2art, hco3, tco2, acidbasedef, o2sat     Lab Results  Component Value Date   TSH 2.653 ***Test methodology is 3rd generation TSH**** 11/03/2008   BNP (last 3 results) No results found for this basename: PROBNP,  in the last 8760 hours Cardiac Panel (last 3 results) No results found for this basename: CKTOTAL, CKMB, TROPONINI, RELINDX,  in the last 72 hours  Iron/TIBC/Ferritin No results found for this basename: iron, tibc, ferritin     EKG Orders placed in visit on 03/21/13  . EKG 12-LEAD     Prior Assessment and Plan Problem List as of 04/23/2013     Cardiovascular and Mediastinum   Atrial fibrillation   Last Assessment & Plan   03/21/2013 Office Visit Written 03/21/2013  2:06 PM by Jodelle Gross, NP     He states that he cannot feel that his HR is elevated presently.  He did not go up on his metoprolol as directed. I will increase this again to 75 mg daily. Consider adding digoxin to his medication regimen should this not control HR if BP becomes a problem.  He will have BMET drawn today.    Carotid occlusion, left   Last Assessment & Plan   01/25/2012 Office Visit Written 01/25/2012 11:34 AM by Dyann Kief, PA     Patient needs follow-up of her right carotid stenosis in 6 months.no need for follow-up Doppler of his left carotid according to Dr. Darrick Penna.    Ischemic cardiomyopathy   Last Assessment & Plan   03/21/2013 Office Visit Written 03/21/2013  2:07 PM by Jodelle Gross, NP     Plan echocardiogram for LV fx. Will add low dose ACE  inhibitor 2.5 mg daily. Labs this week. Follow  up in one month.      Endocrine   Diabetes mellitus type II     Other   DYSLIPIDEMIA   Last Assessment & Plan   03/21/2013 Office Visit Written 03/21/2013  2:07 PM by Jodelle Gross, NP     Fasting lipids and LFT's will be drawn., Continue statin and low cholesterol diet.    Chronic anticoagulation   Bilateral leg numbness   Last Assessment & Plan   10/24/2011 Office Visit Written 10/24/2011  1:09 PM by Jodelle Gross, NP     I have checked his DP and PT pulses by palpation and found them difficult to locate. I used the doppler and was able to auscultate his pulses but sounded distant. Will atrial fib no consistent sound was heard. I will have bilateral ABI with ultrasound completed for better evaluation of this. He is to continue on current medications as directed.        Imaging: No results found.

## 2013-04-23 NOTE — Assessment & Plan Note (Signed)
Most recent echo demonstrates EF of 40-45%.  Do not want to add CCB in this setting. He continues some salt intake.  He wishes to be referred to cardiac rehab for strengthening exercises and education. I will check BMET, TSH today.

## 2013-04-23 NOTE — Patient Instructions (Signed)
Your physician recommends that you schedule a follow-up appointment in: 1 MONTH  Your physician recommends that you return for lab work tomorrow. BMET, TSH, A1C, LIPIDS  Your physician has recommended you make the following change in your medication:  1. Start metoprolol 100 mg daily.

## 2013-04-23 NOTE — Assessment & Plan Note (Signed)
Will check fasting lipids with labs drawn today for ongoing risk management.

## 2013-04-23 NOTE — Assessment & Plan Note (Signed)
Heart rate is not well controlled presently.  Still running higher than 100 bpm.  With new EF of 40-45 bpm. He may do better with coreg vs metoprolol. For now, I will increase his metoprolol to 100 mg. Will see him again in one month to evaluate his HR and response to treatment.

## 2013-05-07 ENCOUNTER — Other Ambulatory Visit: Payer: Self-pay

## 2013-05-07 MED ORDER — SIMVASTATIN 20 MG PO TABS: 20.0000 mg | ORAL_TABLET | Freq: Every day | ORAL | Status: DC

## 2013-05-07 NOTE — Telephone Encounter (Signed)
simvastatin (ZOCOR) 20 MG tablet  TAKE ONE TABLET BY MOUTH AT BEDTIME   30 tablet   3   Jodelle Gross, NP at 04/23/2013  2:18 PM

## 2013-05-16 ENCOUNTER — Ambulatory Visit (INDEPENDENT_AMBULATORY_CARE_PROVIDER_SITE_OTHER): Payer: BC Managed Care – PPO | Admitting: *Deleted

## 2013-05-16 DIAGNOSIS — Z7901 Long term (current) use of anticoagulants: Secondary | ICD-10-CM

## 2013-05-16 DIAGNOSIS — I4891 Unspecified atrial fibrillation: Secondary | ICD-10-CM

## 2013-05-16 DIAGNOSIS — I6350 Cerebral infarction due to unspecified occlusion or stenosis of unspecified cerebral artery: Secondary | ICD-10-CM

## 2013-05-16 DIAGNOSIS — I639 Cerebral infarction, unspecified: Secondary | ICD-10-CM

## 2013-05-16 LAB — POCT INR: INR: 3.7

## 2013-05-24 ENCOUNTER — Encounter: Payer: BC Managed Care – PPO | Admitting: Adult Health

## 2013-05-24 ENCOUNTER — Encounter: Payer: Self-pay | Admitting: Adult Health

## 2013-05-24 NOTE — Progress Notes (Signed)
   HPI: [name] is a 57 year old former patient Dr. Dietrich Pates we are following for ongoing assessment and management of permanent atrial fibrillation on anticoagulation therapy, with history of LV systolic dysfunction secondary to ischemic cardiomyopathy with last EF of 35-40%, also history of CVA with left eye blindness, carotid artery disease followed by Dr. Darrick Penna with the left ICA completely excluded. He was last seen in the office on 04/23/2013 and had had some issues concerning his medications and salt intake. He was restarted on metoprolol 75 mg daily, had been medically compliant, and has been avoiding caffeine. Question to go back to cardiac rehabilitation for assistance in exercise tolerance. The patient's metoprolol was increased to 100 mg daily, for heart rate of 104 at rest. BMET was ordered along with TSH. Review of chart does not show evidence that this was completed by patient.  Allergies  Allergen Reactions  . Other     Mayonnaise  . Penicillins Rash    Current Outpatient Prescriptions  Medication Sig Dispense Refill  . aspirin 81 MG tablet Take 81 mg by mouth daily.      . cholecalciferol (VITAMIN D) 1000 UNITS tablet Take 1,000 Units by mouth daily.      . furosemide (LASIX) 20 MG tablet TAKE ONE TABLET BY MOUTH EVERY DAY  30 tablet  5  . isosorbide mononitrate (IMDUR) 30 MG 24 hr tablet TAKE ONE TABLET BY MOUTH EVERY DAY  30 tablet  2  . lisinopril (PRINIVIL,ZESTRIL) 2.5 MG tablet Take 1 tablet (2.5 mg total) by mouth daily.  90 tablet  3  . metoprolol succinate (TOPROL-XL) 100 MG 24 hr tablet Take 1 tablet (100 mg total) by mouth daily. Take 1 1/2 tablets of 50 mg to equal 75 mg.  30 tablet  6  . Omega-3 Fatty Acids (FISH OIL) 1000 MG CAPS Take 1,000 mg by mouth daily.      . simvastatin (ZOCOR) 20 MG tablet Take 1 tablet (20 mg total) by mouth at bedtime.  30 tablet  3  . warfarin (COUMADIN) 10 MG tablet Take 1 tablet (10 mg total) by mouth daily.  30 tablet  3   No  current facility-administered medications for this visit.    Past Medical History  Diagnosis Date  . Hyperlipidemia   . Gilbert's syndrome   . Vertigo   . Diabetes mellitus     diet controlled  . Angina   . Hypertension   . Atrial fibrillation   . Atrial flutter 2007    h/o  . Stroke ~08/17/11    "lost vision left eye"  . Blood dyscrasia   . Coronary artery disease 08/19/11  . Peripheral vascular disease     Past Surgical History  Procedure Laterality Date  . Achilles tendon repair  06/2002    Left  . Cardioversion  2007    ROS: PHYSICAL EXAM There were no vitals taken for this visit.  EKG:  ASSESSMENT AND PLAN

## 2013-05-28 ENCOUNTER — Encounter (HOSPITAL_COMMUNITY)
Admission: RE | Admit: 2013-05-28 | Discharge: 2013-05-28 | Disposition: A | Discharge status: Home / Self Care | Payer: BC Managed Care – PPO | Source: Ambulatory Visit | Attending: Cardiovascular Disease | Admitting: Cardiovascular Disease

## 2013-05-28 VITALS — BP 122/82 | HR 87 | Ht 73.0 in | Wt 289.2 lb

## 2013-05-28 DIAGNOSIS — I428 Other cardiomyopathies: Secondary | ICD-10-CM | POA: Insufficient documentation

## 2013-05-28 DIAGNOSIS — I4891 Unspecified atrial fibrillation: Secondary | ICD-10-CM

## 2013-05-28 DIAGNOSIS — Z5189 Encounter for other specified aftercare: Secondary | ICD-10-CM | POA: Insufficient documentation

## 2013-05-28 DIAGNOSIS — I255 Ischemic cardiomyopathy: Secondary | ICD-10-CM

## 2013-05-28 NOTE — Patient Instructions (Signed)
Pt has finished orientation and is scheduled to start CR on 06/03/13 at 8:15 am. Pt has been instructed to arrive to class 15 minutes early for scheduled class. Pt has been instructed to wear comfortable clothing and shoes with rubber soles. Pt has been told to take their medications 1 hour prior to coming to class.  If the patient is not going to attend class, he/she has been instructed to call.

## 2013-05-28 NOTE — Progress Notes (Signed)
Patient was referred to CR by the [facility] due to Cardiomyopathy and Afib. During orientation advised patient on arrival and appointment times what to wear, what to do before, during and after exercise. Reviewed attendance and class policy. Talked about inclement weather and class consultation policy. Pt is scheduled to start Cardiac Rehab on 06/03/13 at 8:15 am. Pt was advised to come to class 5 minutes before class starts. He was also given instructions on meeting with the dietician and attending the Family Structure classes. Pt is eager to get started. Patient able to do 6 minute walk test.

## 2013-06-03 ENCOUNTER — Encounter (HOSPITAL_COMMUNITY)
Admission: RE | Admit: 2013-06-03 | Discharge: 2013-06-03 | Disposition: A | Discharge status: Home / Self Care | Payer: BC Managed Care – PPO | Source: Ambulatory Visit | Attending: Cardiovascular Disease | Admitting: Cardiovascular Disease

## 2013-06-05 ENCOUNTER — Encounter (HOSPITAL_COMMUNITY)
Admission: RE | Admit: 2013-06-05 | Discharge: 2013-06-05 | Disposition: A | Discharge status: Home / Self Care | Payer: BC Managed Care – PPO | Source: Ambulatory Visit | Attending: Cardiovascular Disease | Admitting: Cardiovascular Disease

## 2013-06-05 DIAGNOSIS — Z5189 Encounter for other specified aftercare: Secondary | ICD-10-CM | POA: Insufficient documentation

## 2013-06-05 DIAGNOSIS — I4891 Unspecified atrial fibrillation: Secondary | ICD-10-CM | POA: Insufficient documentation

## 2013-06-05 DIAGNOSIS — I428 Other cardiomyopathies: Secondary | ICD-10-CM | POA: Insufficient documentation

## 2013-06-06 ENCOUNTER — Encounter: Payer: Self-pay | Admitting: Cardiology

## 2013-06-07 ENCOUNTER — Encounter (HOSPITAL_COMMUNITY)
Admission: RE | Admit: 2013-06-07 | Discharge: 2013-06-07 | Disposition: A | Discharge status: Home / Self Care | Payer: BC Managed Care – PPO | Source: Ambulatory Visit | Attending: Cardiovascular Disease | Admitting: Cardiovascular Disease

## 2013-06-10 ENCOUNTER — Encounter (HOSPITAL_COMMUNITY)
Admission: RE | Admit: 2013-06-10 | Discharge: 2013-06-10 | Disposition: A | Discharge status: Home / Self Care | Payer: BC Managed Care – PPO | Source: Ambulatory Visit | Attending: Cardiovascular Disease | Admitting: Cardiovascular Disease

## 2013-06-12 ENCOUNTER — Encounter (HOSPITAL_COMMUNITY)
Admission: RE | Admit: 2013-06-12 | Discharge: 2013-06-12 | Disposition: A | Discharge status: Home / Self Care | Payer: BC Managed Care – PPO | Source: Ambulatory Visit | Attending: Cardiovascular Disease | Admitting: Cardiovascular Disease

## 2013-06-14 ENCOUNTER — Encounter (HOSPITAL_COMMUNITY): Payer: BC Managed Care – PPO

## 2013-06-17 ENCOUNTER — Encounter: Payer: BC Managed Care – PPO | Admitting: Adult Health

## 2013-06-17 ENCOUNTER — Other Ambulatory Visit: Payer: Self-pay | Admitting: Vascular Surgery

## 2013-06-17 ENCOUNTER — Encounter (HOSPITAL_COMMUNITY): Payer: BC Managed Care – PPO

## 2013-06-17 ENCOUNTER — Encounter: Payer: Self-pay | Admitting: Adult Health

## 2013-06-17 NOTE — Progress Notes (Signed)
    HPI: [name] is a 57 year old patient formerly of Dr. Dietrich Pates we are following for ongoing assessment and management of permanent atrial fibrillation on Coumadin therapy, with history of LV dysfunction secondary to ischemic cardiomyopathy, with last EF 35-40%, history of CVA with left eye blindness, carotid artery disease followed by Dr. Darrick Penna with left ICA completely occluded.   On last visit in August of 2014, he was complaining of increased heart rate, he denies medical noncompliance, and therefore increased dose of metoprolol to 100 mg daily. He is here for close followup of heart rate and blood pressure with increased doses. He was also referred back to cardiac rehabilitation for strengthening exercises and education. BMET and TSH were ordered. Review of labs does not reveal that he had labs completed.       Allergies  Allergen Reactions  . Other     Mayonnaise  . Penicillins Rash    Current Outpatient Prescriptions  Medication Sig Dispense Refill  . aspirin 81 MG tablet Take 81 mg by mouth daily.      . cholecalciferol (VITAMIN D) 1000 UNITS tablet Take 1,000 Units by mouth daily.      . furosemide (LASIX) 20 MG tablet TAKE ONE TABLET BY MOUTH EVERY DAY  30 tablet  5  . isosorbide mononitrate (IMDUR) 30 MG 24 hr tablet TAKE ONE TABLET BY MOUTH EVERY DAY  30 tablet  2  . lisinopril (PRINIVIL,ZESTRIL) 2.5 MG tablet Take 1 tablet (2.5 mg total) by mouth daily.  90 tablet  3  . metoprolol succinate (TOPROL-XL) 100 MG 24 hr tablet Take 1 tablet (100 mg total) by mouth daily. Take 1 1/2 tablets of 50 mg to equal 75 mg.  30 tablet  6  . Omega-3 Fatty Acids (FISH OIL) 1000 MG CAPS Take 1,000 mg by mouth daily.      . simvastatin (ZOCOR) 20 MG tablet Take 1 tablet (20 mg total) by mouth at bedtime.  30 tablet  3  . warfarin (COUMADIN) 10 MG tablet Take 1 tablet (10 mg total) by mouth daily.  30 tablet  3   No current facility-administered medications for this visit.    Past Medical  History  Diagnosis Date  . Hyperlipidemia   . Gilbert's syndrome   . Vertigo   . Diabetes mellitus     diet controlled  . Angina   . Hypertension   . Atrial fibrillation   . Atrial flutter 2007    h/o  . Stroke ~08/17/11    "lost vision left eye"  . Blood dyscrasia   . Coronary artery disease 08/19/11  . Peripheral vascular disease     Past Surgical History  Procedure Laterality Date  . Achilles tendon repair  06/2002    Left  . Cardioversion  2007    ROS: PHYSICAL EXAM There were no vitals taken for this visit.  EKG:  ASSESSMENT AND PLAN

## 2013-06-19 ENCOUNTER — Encounter (HOSPITAL_COMMUNITY): Payer: BC Managed Care – PPO

## 2013-06-21 ENCOUNTER — Encounter (HOSPITAL_COMMUNITY): Payer: BC Managed Care – PPO

## 2013-06-24 ENCOUNTER — Encounter: Payer: Self-pay | Admitting: *Deleted

## 2013-06-24 ENCOUNTER — Telehealth: Payer: Self-pay

## 2013-06-24 ENCOUNTER — Encounter (HOSPITAL_COMMUNITY): Payer: BC Managed Care – PPO

## 2013-06-24 NOTE — Telephone Encounter (Signed)
Received fax refill request  Rx # [id number]  Medication:  Imdur 30mg  ER Tab Qty 30 Sig:  One tab by mouth once daily *Patient needs to schedule a follow up* Physician:  Dietrich Pates Samara Deist)

## 2013-06-24 NOTE — Telephone Encounter (Signed)
Noted  

## 2013-06-24 NOTE — Telephone Encounter (Signed)
ZOX:WRUEA pt no showed/cancelled/no showed his f/u with [facility] on 05-24-13 and coumadin visit as well  and 06-17-13 f/u apt , as well as many tests advised to have such as Carotid Dopplers, notation sent to pt pharmacy for pt to call office for a refill, mailed certified letter to have pt call office to scheduled and keep apt in order to continue to receive medication refills per multiple notifications in last refill of medication to follow up,pend notation until certified mail receipt signed that pt has been informed via mail per unable to contact pt via telephone at this time

## 2013-06-26 ENCOUNTER — Encounter: Payer: Self-pay | Admitting: Family

## 2013-06-26 ENCOUNTER — Encounter (HOSPITAL_COMMUNITY): Payer: BC Managed Care – PPO

## 2013-06-26 NOTE — Telephone Encounter (Signed)
Received receipt back from letter mailed certified to pt # [phone]

## 2013-06-27 ENCOUNTER — Ambulatory Visit: Payer: BC Managed Care – PPO | Admitting: Vascular Surgery

## 2013-06-27 ENCOUNTER — Inpatient Hospital Stay (HOSPITAL_COMMUNITY): Admission: RE | Admit: 2013-06-27 | Payer: Self-pay | Source: Ambulatory Visit

## 2013-06-27 ENCOUNTER — Ambulatory Visit: Payer: Self-pay | Admitting: Family

## 2013-06-27 ENCOUNTER — Other Ambulatory Visit: Payer: BC Managed Care – PPO

## 2013-06-28 ENCOUNTER — Encounter (HOSPITAL_COMMUNITY): Payer: BC Managed Care – PPO

## 2013-06-28 ENCOUNTER — Telehealth: Payer: Self-pay | Admitting: Cardiology

## 2013-06-28 MED ORDER — ISOSORBIDE MONONITRATE ER 30 MG PO TB24: ORAL_TABLET | ORAL | Status: DC

## 2013-06-28 NOTE — Telephone Encounter (Signed)
rx sent to pharmacy by e-script  

## 2013-06-28 NOTE — Telephone Encounter (Signed)
Received fax refill request  Rx # [id number] Medication:  Imdur 30mg  ER tab Qty 30 Sig:  Take one tablet by mouth once daily Physician:  rothbart

## 2013-07-01 ENCOUNTER — Encounter (HOSPITAL_COMMUNITY): Payer: BC Managed Care – PPO

## 2013-07-03 ENCOUNTER — Encounter (HOSPITAL_COMMUNITY): Payer: BC Managed Care – PPO

## 2013-07-05 ENCOUNTER — Encounter (HOSPITAL_COMMUNITY): Payer: BC Managed Care – PPO

## 2013-07-08 ENCOUNTER — Encounter (HOSPITAL_COMMUNITY): Payer: BC Managed Care – PPO

## 2013-07-10 ENCOUNTER — Encounter (HOSPITAL_COMMUNITY): Payer: BC Managed Care – PPO

## 2013-07-12 ENCOUNTER — Encounter (HOSPITAL_COMMUNITY): Payer: BC Managed Care – PPO

## 2013-07-15 ENCOUNTER — Encounter (HOSPITAL_COMMUNITY): Payer: BC Managed Care – PPO

## 2013-07-17 ENCOUNTER — Encounter (HOSPITAL_COMMUNITY): Payer: BC Managed Care – PPO

## 2013-07-19 ENCOUNTER — Encounter (HOSPITAL_COMMUNITY): Payer: BC Managed Care – PPO

## 2013-07-22 ENCOUNTER — Encounter (HOSPITAL_COMMUNITY): Payer: BC Managed Care – PPO

## 2013-07-24 ENCOUNTER — Encounter (HOSPITAL_COMMUNITY): Payer: BC Managed Care – PPO

## 2013-07-26 ENCOUNTER — Encounter (HOSPITAL_COMMUNITY): Payer: BC Managed Care – PPO

## 2013-07-29 ENCOUNTER — Encounter (HOSPITAL_COMMUNITY): Payer: BC Managed Care – PPO

## 2013-07-31 ENCOUNTER — Encounter (HOSPITAL_COMMUNITY): Payer: BC Managed Care – PPO

## 2013-08-02 ENCOUNTER — Encounter (HOSPITAL_COMMUNITY): Payer: BC Managed Care – PPO

## 2013-08-05 ENCOUNTER — Encounter (HOSPITAL_COMMUNITY): Payer: BC Managed Care – PPO

## 2013-08-07 ENCOUNTER — Encounter (HOSPITAL_COMMUNITY): Payer: BC Managed Care – PPO

## 2013-08-09 ENCOUNTER — Encounter (HOSPITAL_COMMUNITY): Payer: BC Managed Care – PPO

## 2013-08-12 ENCOUNTER — Encounter (HOSPITAL_COMMUNITY): Payer: BC Managed Care – PPO

## 2013-08-14 ENCOUNTER — Encounter (HOSPITAL_COMMUNITY): Payer: BC Managed Care – PPO

## 2013-08-16 ENCOUNTER — Encounter (HOSPITAL_COMMUNITY): Payer: BC Managed Care – PPO

## 2013-08-19 ENCOUNTER — Encounter (HOSPITAL_COMMUNITY): Payer: BC Managed Care – PPO

## 2013-08-21 ENCOUNTER — Encounter (HOSPITAL_COMMUNITY): Payer: BC Managed Care – PPO

## 2013-08-23 ENCOUNTER — Encounter (HOSPITAL_COMMUNITY): Payer: BC Managed Care – PPO

## 2013-08-26 ENCOUNTER — Other Ambulatory Visit: Payer: Self-pay | Admitting: Adult Health

## 2013-09-06 ENCOUNTER — Encounter: Payer: Self-pay | Admitting: *Deleted

## 2013-10-08 NOTE — Addendum Note (Signed)
Encounter addended by: Angelica PouNancy L Curties Conigliaro, RN on: 10/08/2013  7:25 AM<BR>     Documentation filed: Notes Section

## 2013-10-08 NOTE — Progress Notes (Signed)
Cardiac Rehabilitation Program09/23/2014 Outcomes Report   Orientation:  05/28/2013 Graduate Date:  NA Discharge Date:  06/12/2013 # of sessions completed: 5 DX: Cardiomyopathy/ atrial Fibrillation  Cardiologist: Margaretmary LombardKoneswaran Family MD:  Darden DatesWest Class Time:  08:15  A.  Exercise Program:  Tolerates exercise @ 3.93 METS for 15 minutes, Walk Test Results:  Pre: Pre Walk test Resting HR 87, BP 122/82, O2 99% RPE 7 and RPD 6, 6minuteHR 147, BP 170/88 O2 99% RPE 11 and RPD 9,  Post HR 83, BP 130/88,O2 99%, RPE 9, RPD 8, walked 1400 feet at 2.65 mph at 3.0 METS. No post test performed patient did not finish program and Discharged  B.  Mental Health:  Good mental attitude  C.  Education/Instruction/Skills  Accurately checks own pulse.  Rest:  70  Exercise:147 ,  Knows THR for exercise, Uses Perceived Exertion Scale and/or Dyspnea Scale and Attended 1 education classes  Uses Perceived Exertion Scale and/or Dyspnea Scale  D.  Nutrition/Weight Control/Body Composition:  Adherence to prescribed nutrition program: good    E.  Blood Lipids    Lab Results  Component Value Date   CHOL 188 10/27/2011   HDL 37* 10/27/2011   LDLCALC 135* 10/27/2011   TRIG 81 10/27/2011   CHOLHDL 5.1 10/27/2011    F.  Lifestyle Changes:  Making positive lifestyle changes and Not smoking:  Quit 2012  G.  Symptoms noted with exercise:  Asymptomatic  Report Completed By:  Lelon HuhNancy L. Mckinzy Fuller RN   Comments:  This is patients discharge report. He only attended 5 sessions. His resting HR was 70 and His resting BP was 120/70 and his peak HR was 147 and peak BP was 140/80.  Did not finish cardiac Rehab.

## 2013-10-08 NOTE — Addendum Note (Signed)
Encounter addended by: Angelica PouNancy L Ankith Edmonston, RN on: 10/08/2013  7:26 AM<BR>     Documentation filed: Clinical Notes

## 2013-10-18 ENCOUNTER — Other Ambulatory Visit: Payer: Self-pay | Admitting: Adult Health

## 2013-11-20 ENCOUNTER — Encounter: Payer: Self-pay | Admitting: *Deleted

## 2014-02-03 ENCOUNTER — Other Ambulatory Visit: Payer: Self-pay | Admitting: Adult Health

## 2014-02-06 ENCOUNTER — Encounter: Payer: Self-pay | Admitting: *Deleted

## 2014-02-10 ENCOUNTER — Telehealth: Payer: Self-pay | Admitting: Adult Health

## 2014-02-10 MED ORDER — WARFARIN SODIUM 10 MG PO TABS: ORAL_TABLET | ORAL | Status: DC

## 2014-02-10 NOTE — Telephone Encounter (Signed)
Sent to coumadin nurse

## 2014-02-10 NOTE — Telephone Encounter (Signed)
Done

## 2014-02-12 ENCOUNTER — Other Ambulatory Visit: Payer: Self-pay | Admitting: Adult Health

## 2014-02-18 ENCOUNTER — Ambulatory Visit: Payer: Self-pay | Admitting: Cardiology

## 2014-02-25 ENCOUNTER — Encounter: Payer: Self-pay | Admitting: *Deleted

## 2014-02-25 ENCOUNTER — Ambulatory Visit (INDEPENDENT_AMBULATORY_CARE_PROVIDER_SITE_OTHER): Payer: Medicare Other | Admitting: Cardiology

## 2014-02-25 ENCOUNTER — Encounter: Payer: Self-pay | Admitting: Cardiology

## 2014-02-25 ENCOUNTER — Ambulatory Visit (INDEPENDENT_AMBULATORY_CARE_PROVIDER_SITE_OTHER): Payer: Medicare Other | Admitting: *Deleted

## 2014-02-25 VITALS — BP 133/91 | HR 92 | Ht 73.0 in | Wt 277.0 lb

## 2014-02-25 DIAGNOSIS — I10 Essential (primary) hypertension: Secondary | ICD-10-CM

## 2014-02-25 DIAGNOSIS — R0602 Shortness of breath: Secondary | ICD-10-CM

## 2014-02-25 DIAGNOSIS — Z79899 Other long term (current) drug therapy: Secondary | ICD-10-CM

## 2014-02-25 DIAGNOSIS — I4891 Unspecified atrial fibrillation: Secondary | ICD-10-CM

## 2014-02-25 DIAGNOSIS — Z7901 Long term (current) use of anticoagulants: Secondary | ICD-10-CM

## 2014-02-25 DIAGNOSIS — I482 Chronic atrial fibrillation, unspecified: Secondary | ICD-10-CM

## 2014-02-25 DIAGNOSIS — I2589 Other forms of chronic ischemic heart disease: Secondary | ICD-10-CM

## 2014-02-25 DIAGNOSIS — E785 Hyperlipidemia, unspecified: Secondary | ICD-10-CM

## 2014-02-25 DIAGNOSIS — I5022 Chronic systolic (congestive) heart failure: Secondary | ICD-10-CM

## 2014-02-25 DIAGNOSIS — Z5181 Encounter for therapeutic drug level monitoring: Secondary | ICD-10-CM | POA: Insufficient documentation

## 2014-02-25 LAB — POCT INR: INR: 1.1

## 2014-02-25 MED ORDER — LISINOPRIL 2.5 MG PO TABS: 2.5000 mg | ORAL_TABLET | Freq: Every day | ORAL | Status: DC

## 2014-02-25 MED ORDER — METOPROLOL SUCCINATE ER 25 MG PO TB24: 25.0000 mg | ORAL_TABLET | Freq: Every day | ORAL | Status: DC

## 2014-02-25 NOTE — Patient Instructions (Signed)
   Labs for BMET, Magnesium, TSH, CBC, FLP - Reminder:  Nothing to eat or drink after 12 midnight prior to labs.  Your physician has requested that you have en exercise stress myoview. For further information please visit [email or web address]. Please follow instruction sheet, as given. Office will contact with results via phone or letter.   Decrease Toprol XL to 25mg  daily - new sent to pharm Resume Lisinopril 2.5mg  daily - new sent to pharm Continue all other medications.   Follow up in  3-4 weeks

## 2014-02-25 NOTE — Progress Notes (Signed)
Clinical Summary [name] is a 58 y.o.male last seen by NP Lyman BishopLawrence, this is our first visit together. He is seen for the following medical problems.  1. Permanent afib - has been off Toprol for 3 months, had been unable to afford his meds. - reports occasional palpitations, some episodes can last up to 10-15 minutes - compliant with coumadin, no troubles with bleeding  2. Chronic systolic heart failure -  03/2013 LVEF 40-45%. A clinic note 08/2011 mentions ordering a non-invasive ischemic evaluation however I do not see any results in our system, unclear from notes if this has been thought to be an ischemic or non-ischemic CM - has had troubles with compliance based on prior notes - off several meds for 3 months including Toprol and lisinopril, could not afford them. Reports now Medicare has come through and can afford. - denies any SOB or DOE, no orthopnea, occas LE edema.    3. Carotid stenosis - followed by vascular, history of LICA chronic occlusion   Past Medical History  Diagnosis Date  . Hyperlipidemia   . Gilbert's syndrome   . Vertigo   . Diabetes mellitus     diet controlled  . Angina   . Hypertension   . Atrial fibrillation   . Atrial flutter 2007    h/o  . Stroke ~08/17/11    "lost vision left eye"  . Blood dyscrasia   . Coronary artery disease 08/19/11  . Peripheral vascular disease      Allergies  Allergen Reactions  . Other     Mayonnaise  . Penicillins Rash     Current Outpatient Prescriptions  Medication Sig Dispense Refill  . aspirin 81 MG tablet Take 81 mg by mouth daily.      . cholecalciferol (VITAMIN D) 1000 UNITS tablet Take 1,000 Units by mouth daily.      . furosemide (LASIX) 20 MG tablet TAKE ONE TABLET BY MOUTH ONCE DAILY  30 tablet  6  . isosorbide mononitrate (IMDUR) 30 MG 24 hr tablet TAKE ONE TABLET BY MOUTH ONCE DAILY **NEED TO SCHEDULE FOLLOW UP VISIT**  30 tablet  6  . lisinopril (PRINIVIL,ZESTRIL) 2.5 MG tablet Take 1  tablet (2.5 mg total) by mouth daily.  90 tablet  3  . metoprolol succinate (TOPROL-XL) 100 MG 24 hr tablet Take 1 tablet (100 mg total) by mouth daily. Take 1 1/2 tablets of 50 mg to equal 75 mg.  30 tablet  6  . metoprolol succinate (TOPROL-XL) 25 MG 24 hr tablet TAKE THREE TABLETS BY MOUTH EVERY DAY  180 tablet  6  . Omega-3 Fatty Acids (FISH OIL) 1000 MG CAPS Take 1,000 mg by mouth daily.      . simvastatin (ZOCOR) 20 MG tablet Take 1 tablet (20 mg total) by mouth at bedtime.  30 tablet  3  . warfarin (COUMADIN) 10 MG tablet Take 1 tablet daily except 1/2 tablet on Sundays and Wednesdays  15 tablet  0   No current facility-administered medications for this visit.     Past Surgical History  Procedure Laterality Date  . Achilles tendon repair  06/2002    Left  . Cardioversion  2007     Allergies  Allergen Reactions  . Other     Mayonnaise  . Penicillins Rash      Family History  Problem Relation Age of Onset  . Diabetes Mother   . Hyperlipidemia Mother   . Hypertension Mother   . Emphysema  Father   . Heart failure Father   . Heart attack Brother   . Aneurysm Sister   . Deep vein thrombosis Sister   . Diabetes Sister   . Heart attack Sister      Social History [name] reports that he quit smoking about 2 years ago. His smoking use included Cigars. He has never used smokeless tobacco. [name] reports that he drinks about 1.8 ounces of alcohol per week.   Review of Systems CONSTITUTIONAL: No weight loss, fever, chills, weakness or fatigue.  HEENT: Eyes: No visual loss, blurred vision, double vision or yellow sclerae.No hearing loss, sneezing, congestion, runny nose or sore throat.  SKIN: No rash or itching.  CARDIOVASCULAR: per HPI RESPIRATORY: No shortness of breath, cough or sputum.  GASTROINTESTINAL: No anorexia, nausea, vomiting or diarrhea. No abdominal pain or blood.  GENITOURINARY: No burning on urination, no polyuria NEUROLOGICAL: No headache,  dizziness, syncope, paralysis, ataxia, numbness or tingling in the extremities. No change in bowel or bladder control.  MUSCULOSKELETAL: No muscle, back pain, joint pain or stiffness.  LYMPHATICS: No enlarged nodes. No history of splenectomy.  PSYCHIATRIC: No history of depression or anxiety.  ENDOCRINOLOGIC: No reports of sweating, cold or heat intolerance. No polyuria or polydipsia.  Marland Kitchen.   Physical Examination p 92 bp 133/91 Wt 277 lbs BMI 37 Gen: resting comfortably, no acute distress HEENT: no scleral icterus, pupils equal round and reactive, no palptable cervical adenopathy,  CV: irreg, no m/r/g, no JVD, no carotid bruits Resp: Clear to auscultation bilaterally GI: abdomen is soft, non-tender, non-distended, normal bowel sounds, no hepatosplenomegaly MSK: extremities are warm, no edema.  Skin: warm, no rash Neuro:  no focal deficits Psych: appropriate affect   Diagnostic Studies 03/2013 Echo Study Conclusions  - Left ventricle: Technically difficult study. There was mild concentric hypertrophy. Systolic function was mildly to moderately reduced. The estimated ejection fraction was in the range of 40% to 45%. Diffuse hypokinesis. The study is not technically sufficient to allow evaluation of LV diastolic function. - Mitral valve: Mildly thickened leaflets . - Left atrium: Moderately dilated. - Right atrium: The atrium was mildly dilated.      Assessment and Plan  1. Afib - restart Toprol, continue coumadin  2. Chronic systolic heart failure - last LVEF 40-45% by echo 03/2013, NYHA II. Does not appear he has had an ischemic evaluation from extensive chart review. He has been off meds x 3 months due to cost, however now states he has medicare and is able to get meds - restart Toprol XL at 25mg  daily and lisinopril 2.5mg  daily with plans for further titration at followe up - send for labs today  3. Carotid stenosis - continue to follow with vascular   F/u 3-4  weeks      Antoine PocheJonathan F. Branch, M.D., F.A.C.C.

## 2014-02-28 ENCOUNTER — Ambulatory Visit (HOSPITAL_COMMUNITY): Admission: RE | Admit: 2014-02-28 | Payer: Medicare Other | Source: Ambulatory Visit

## 2014-02-28 ENCOUNTER — Encounter (HOSPITAL_COMMUNITY)
Admission: RE | Admit: 2014-02-28 | Discharge: 2014-02-28 | Disposition: A | Discharge status: Home / Self Care | Payer: Medicare Other | Source: Ambulatory Visit | Attending: Cardiology | Admitting: Cardiology

## 2014-02-28 ENCOUNTER — Encounter (HOSPITAL_COMMUNITY): Admission: RE | Admit: 2014-02-28 | Payer: Medicare Other | Source: Ambulatory Visit

## 2014-02-28 DIAGNOSIS — R0602 Shortness of breath: Secondary | ICD-10-CM | POA: Insufficient documentation

## 2014-03-03 ENCOUNTER — Ambulatory Visit: Payer: Self-pay | Admitting: Cardiology

## 2014-03-05 ENCOUNTER — Encounter (HOSPITAL_COMMUNITY): Payer: Medicare Other

## 2014-03-05 ENCOUNTER — Inpatient Hospital Stay (HOSPITAL_COMMUNITY): Admission: RE | Admit: 2014-03-05 | Payer: Medicare Other | Source: Ambulatory Visit

## 2014-03-07 ENCOUNTER — Encounter (HOSPITAL_COMMUNITY): Payer: Medicare Other

## 2014-03-10 ENCOUNTER — Encounter (HOSPITAL_COMMUNITY): Payer: Self-pay

## 2014-03-10 ENCOUNTER — Ambulatory Visit (HOSPITAL_COMMUNITY)
Admission: RE | Admit: 2014-03-10 | Discharge: 2014-03-10 | Disposition: A | Discharge status: Home / Self Care | Payer: Medicare Other | Source: Ambulatory Visit | Attending: Cardiology | Admitting: Cardiology

## 2014-03-10 ENCOUNTER — Encounter (HOSPITAL_COMMUNITY)
Admission: RE | Admit: 2014-03-10 | Discharge: 2014-03-10 | Disposition: A | Discharge status: Home / Self Care | Payer: Medicare Other | Source: Ambulatory Visit | Attending: Cardiology | Admitting: Cardiology

## 2014-03-10 DIAGNOSIS — R0602 Shortness of breath: Secondary | ICD-10-CM

## 2014-03-10 DIAGNOSIS — I4891 Unspecified atrial fibrillation: Secondary | ICD-10-CM

## 2014-03-10 DIAGNOSIS — I739 Peripheral vascular disease, unspecified: Secondary | ICD-10-CM | POA: Insufficient documentation

## 2014-03-10 DIAGNOSIS — I5022 Chronic systolic (congestive) heart failure: Secondary | ICD-10-CM | POA: Insufficient documentation

## 2014-03-10 MED ORDER — TECHNETIUM TC 99M SESTAMIBI - CARDIOLITE
30.0000 | Freq: Once | INTRAVENOUS | Status: AC | PRN
  Administered 2014-03-10: 30 via INTRAVENOUS

## 2014-03-10 MED ORDER — TECHNETIUM TC 99M SESTAMIBI GENERIC - CARDIOLITE
10.0000 | Freq: Once | INTRAVENOUS | Status: AC | PRN
  Administered 2014-03-10: 10 via INTRAVENOUS

## 2014-03-10 MED ORDER — REGADENOSON 0.4 MG/5ML IV SOLN: 0.4000 mg | Freq: Once | INTRAVENOUS | Status: DC | PRN

## 2014-03-10 MED ORDER — SODIUM CHLORIDE 0.9 % IJ SOLN
10.0000 mL | INTRAMUSCULAR | Status: DC | PRN
  Administered 2014-03-10: 10 mL via INTRAVENOUS

## 2014-03-10 MED ORDER — SODIUM CHLORIDE 0.9 % IJ SOLN
INTRAMUSCULAR | Status: AC
  Administered 2014-03-10: 10 mL via INTRAVENOUS
  Filled 2014-03-10: qty 10

## 2014-03-10 NOTE — Progress Notes (Signed)
Stress Lab Nurses Notes - [facility]  Caprice Beaverimothy D Sagona 03/10/2014 Reason for doing test: Afib Type of test: Stress Cardiolite Nurse performing test: Parke PoissonPhyllis Billingsly, RN Nuclear Medicine Tech: Marcella DubsMiranda  [place] Echo Tech: Not Applicable MD performing test: Purvis SheffieldKoneswaran /K.Lyman BishopLawrence NP Family MD: Felecia ShellingFanta Test explained and consent signed: Yes.   IV started: 22g jelco, Saline lock flushed, No redness or edema and Saline lock started in radiology Symptoms: SOB Treatment/Intervention: None Reason test stopped: fatigue After recovery IV was: Discontinued via X-ray tech and No redness or edema Patient to return to Nuc. Med at : 10:15 Patient discharged: Home Patient's Condition upon discharge was: stable Comments: During test BP 196/88 & HR 193.  Recovery BP 126/87 & HR 85.  Symptoms resolved in recovery. Erskine SpeedBillingsley, Rhonda Vangieson T

## 2014-03-11 ENCOUNTER — Other Ambulatory Visit: Payer: Self-pay | Admitting: *Deleted

## 2014-03-11 ENCOUNTER — Telehealth: Payer: Self-pay | Admitting: Cardiology

## 2014-03-11 DIAGNOSIS — I2589 Other forms of chronic ischemic heart disease: Secondary | ICD-10-CM

## 2014-03-11 DIAGNOSIS — I10 Essential (primary) hypertension: Secondary | ICD-10-CM

## 2014-03-11 MED ORDER — SIMVASTATIN 20 MG PO TABS: 20.0000 mg | ORAL_TABLET | Freq: Every day | ORAL | Status: DC

## 2014-03-11 MED ORDER — FUROSEMIDE 20 MG PO TABS: 20.0000 mg | ORAL_TABLET | Freq: Every day | ORAL | Status: DC

## 2014-03-11 MED ORDER — WARFARIN SODIUM 10 MG PO TABS: ORAL_TABLET | ORAL | Status: DC

## 2014-03-11 MED ORDER — ISOSORBIDE MONONITRATE ER 30 MG PO TB24: 30.0000 mg | ORAL_TABLET | Freq: Every day | ORAL | Status: DC

## 2014-03-11 NOTE — Telephone Encounter (Signed)
[facility].  He has no medications waiting.  Patient has picked up new medications from last OV on 02/25/2014.  Left message to return call at [phone] & mail box full at [phone].

## 2014-03-11 NOTE — Telephone Encounter (Signed)
[name] is calling stating that we need to release his medications. [facility] [place]  and they are telling him they Have no medications for him.  [phone] or [phone]

## 2014-03-11 NOTE — Telephone Encounter (Signed)
Patient returned call - stating that he needed the rest of his medications refilled also.  [facility] for Lasix, Warfarin, Imdur, & Simvastatin.

## 2014-03-12 ENCOUNTER — Telehealth: Payer: Self-pay | Admitting: *Deleted

## 2014-03-12 NOTE — Telephone Encounter (Signed)
Notes Recorded by Lesle ChrisAngela G Hill, LPN on 1/6/10967/04/2014 at 11:53 AM Patient notified. Has follow up already scheduled for 03/14/2014 with Dr. Wyline MoodBranch.

## 2014-03-12 NOTE — Telephone Encounter (Signed)
Message copied by Lesle ChrisHILL, Caelan Atchley G on Wed Mar 12, 2014 11:53 AM ------      Message from: MorrisvilleBRANCH, JONATHAN F      Created: Tue Mar 11, 2014  9:31 AM       Stress test results suggest possible old blockage/damage to the bottom of the heart, no evidence of any new blockages. We will continue working with medications to strengthen his heart            Dominga FerryJ Branch MD ------

## 2014-03-14 ENCOUNTER — Ambulatory Visit (INDEPENDENT_AMBULATORY_CARE_PROVIDER_SITE_OTHER): Payer: Medicare Other | Admitting: Cardiology

## 2014-03-14 ENCOUNTER — Ambulatory Visit (INDEPENDENT_AMBULATORY_CARE_PROVIDER_SITE_OTHER): Payer: Medicare Other | Admitting: *Deleted

## 2014-03-14 ENCOUNTER — Encounter: Payer: Self-pay | Admitting: Cardiology

## 2014-03-14 VITALS — BP 120/78 | HR 76 | Ht 73.0 in | Wt 276.8 lb

## 2014-03-14 DIAGNOSIS — Z5181 Encounter for therapeutic drug level monitoring: Secondary | ICD-10-CM

## 2014-03-14 DIAGNOSIS — I5022 Chronic systolic (congestive) heart failure: Secondary | ICD-10-CM

## 2014-03-14 DIAGNOSIS — I2589 Other forms of chronic ischemic heart disease: Secondary | ICD-10-CM

## 2014-03-14 DIAGNOSIS — Z7901 Long term (current) use of anticoagulants: Secondary | ICD-10-CM

## 2014-03-14 DIAGNOSIS — I4891 Unspecified atrial fibrillation: Secondary | ICD-10-CM

## 2014-03-14 DIAGNOSIS — I482 Chronic atrial fibrillation, unspecified: Secondary | ICD-10-CM

## 2014-03-14 DIAGNOSIS — E785 Hyperlipidemia, unspecified: Secondary | ICD-10-CM

## 2014-03-14 LAB — POCT INR: INR: 1.3

## 2014-03-14 MED ORDER — METOPROLOL SUCCINATE ER 50 MG PO TB24
50.0000 mg | ORAL_TABLET | Freq: Every day | ORAL | Status: DC
Start: 2014-03-14 — End: 2014-04-11

## 2014-03-14 MED ORDER — ATORVASTATIN CALCIUM 40 MG PO TABS
40.0000 mg | ORAL_TABLET | Freq: Every day | ORAL | Status: DC
Start: 2014-03-14 — End: 2014-12-10

## 2014-03-14 NOTE — Patient Instructions (Signed)
   Increase Toprol XL to 50mg  daily - new sent to pharm  Stop Imdur  Stop Zocor  Change to Lipitor 40mg  daily - new sent to pharm Continue all other medications.   Follow up in  1 month

## 2014-03-14 NOTE — Progress Notes (Signed)
Clinical Summary [name] is a 58 y.o.male seen today for follow up of the following medical problems.  1. Permanent afib  - denies any significant palpitations - compliant with coumadin, no troubles with bleeding   2. Chronic systolic heart failure  - 03/2013 LVEF 40-45%. - 02/25/14 Lexiscan MPI with fixed basal inferior defect, scar vs attenuation. LVEF 37%, basal inferior hypokinesis.  - has had troubles with compliance based on prior notes  -has just recently solidified his insurance and has committed to medication compliance.  - last visit started Toprol XL 25mg  and lisinopril 2.5 mg daily.  - denies any SOB or DOE, no orthopnea, occas LE edema.     3. Carotid stenosis  - followed by vascular, history of LICA chronic occlusion  4. Hyperlipidemia - 02/2014 TC 184 TG 48 HDL 41 LDL 133 - compliant with zocor  Past Medical History  Diagnosis Date  . Hyperlipidemia   . Gilbert's syndrome   . Vertigo   . Diabetes mellitus     diet controlled  . Angina   . Hypertension   . Atrial fibrillation   . Atrial flutter 2007    h/o  . Stroke ~08/17/11    "lost vision left eye"  . Blood dyscrasia   . Coronary artery disease 08/19/11  . Peripheral vascular disease      Allergies  Allergen Reactions  . Other     Mayonnaise  . Penicillins Rash     Current Outpatient Prescriptions  Medication Sig Dispense Refill  . aspirin 81 MG tablet Take 81 mg by mouth daily.      . cholecalciferol (VITAMIN D) 1000 UNITS tablet Take 1,000 Units by mouth daily.      . furosemide (LASIX) 20 MG tablet Take 1 tablet (20 mg total) by mouth daily.  30 tablet  6  . isosorbide mononitrate (IMDUR) 30 MG 24 hr tablet Take 1 tablet (30 mg total) by mouth daily.  30 tablet  6  . lisinopril (PRINIVIL,ZESTRIL) 2.5 MG tablet Take 1 tablet (2.5 mg total) by mouth daily.  30 tablet  6  . metoprolol succinate (TOPROL-XL) 25 MG 24 hr tablet Take 1 tablet (25 mg total) by mouth daily.  30 tablet  6    . Omega-3 Fatty Acids (FISH OIL) 1000 MG CAPS Take 1,000 mg by mouth daily.      . simvastatin (ZOCOR) 20 MG tablet Take 1 tablet (20 mg total) by mouth at bedtime.  30 tablet  3  . warfarin (COUMADIN) 10 MG tablet Take 1 tablet daily except 1/2 tablet on Sundays and Wednesdays  30 tablet  2   No current facility-administered medications for this visit.     Past Surgical History  Procedure Laterality Date  . Achilles tendon repair  06/2002    Left  . Cardioversion  2007     Allergies  Allergen Reactions  . Other     Mayonnaise  . Penicillins Rash      Family History  Problem Relation Age of Onset  . Diabetes Mother   . Hyperlipidemia Mother   . Hypertension Mother   . Emphysema Father   . Heart failure Father   . Heart attack Brother   . Aneurysm Sister   . Deep vein thrombosis Sister   . Diabetes Sister   . Heart attack Sister      Social History [name] reports that he quit smoking about 2 years ago. His smoking use included Cigars.  He started smoking about 18 years ago. He has never used smokeless tobacco. [name] reports that he drinks about 1.8 ounces of alcohol per week.   Review of Systems CONSTITUTIONAL: No weight loss, fever, chills, weakness or fatigue.  HEENT: Eyes: No visual loss, blurred vision, double vision or yellow sclerae.No hearing loss, sneezing, congestion, runny nose or sore throat.  SKIN: No rash or itching.  CARDIOVASCULAR: per HPI RESPIRATORY: No shortness of breath, cough or sputum.  GASTROINTESTINAL: No anorexia, nausea, vomiting or diarrhea. No abdominal pain or blood.  GENITOURINARY: No burning on urination, no polyuria NEUROLOGICAL: No headache, dizziness, syncope, paralysis, ataxia, numbness or tingling in the extremities. No change in bowel or bladder control.  MUSCULOSKELETAL: No muscle, back pain, joint pain or stiffness.  LYMPHATICS: No enlarged nodes. No history of splenectomy.  PSYCHIATRIC: No history of depression or  anxiety.  ENDOCRINOLOGIC: No reports of sweating, cold or heat intolerance. No polyuria or polydipsia.  Marland Kitchen   Physical Examination p 76 bp 120/78 Wt 276 lbs BMI 37 Gen: resting comfortably, no acute distress HEENT: no scleral icterus, pupils equal round and reactive, no palptable cervical adenopathy,  CV: irreg, regular rate, no m/r/g, no JVD Resp: Clear to auscultation bilaterally GI: abdomen is soft, non-tender, non-distended, normal bowel sounds, no hepatosplenomegaly MSK: extremities are warm, no edema.  Skin: warm, no rash Neuro:  no focal deficits Psych: appropriate affect   Diagnostic Studies 03/2013 Echo  Study Conclusions  - Left ventricle: Technically difficult study. There was mild concentric hypertrophy. Systolic function was mildly to moderately reduced. The estimated ejection fraction was in the range of 40% to 45%. Diffuse hypokinesis. The study is not technically sufficient to allow evaluation of LV diastolic function. - Mitral valve: Mildly thickened leaflets . - Left atrium: Moderately dilated. - Right atrium: The atrium was mildly dilated.   02/2014 Lexiscan MPI Analysis of the raw data did not demonstrate any significant  extracardiac radiotracer uptake. Analysis of the perfusion images  demonstrated a moderate size, moderately intense, basilar inferior  wall defect. Images were worse on rest than stress. Inferior soft  tissue attenuation also noted. Left ventricular systolic function  was moderately reduced, calculated LV EF 37%. The aforementioned  wall is hypokinetic.  IMPRESSION:  1. Abnormal exercise Cardiolite stress test.  2. Rapid atrial fibrillation noted with exercise.  3. Fixed basal inferior wall defect. While this may represent  myocardial scar, interfering soft tissue attenuation cannot entirely  be excluded.  4. Moderately reduced LV systolic function, calculated LVEF 37%.    Assessment and Plan   1. Afib  - no significant symptoms,  continue Toprol and coumadin  2. Chronic systolic heart failure  - last LVEF 40-45% by echo 03/2013, NYHA II.  - based on non-invasive testing appears to be a possible ischemic component with evidence of prior scar, no active ischemia.  - increase Toprol XL to 50mg  daily. Will stop imdur at this time to prioritize titration of beta blocker and ACE-I  3. Carotid stenosis  - continue to follow with vascular  4. Hyperlipidemia - with hx of occluded carotid and evidence of possible old MI, patient needs to be treated aggerssively for lipid control - last -panel LDL not at goal of <70, stop simvastatin and start lipitor 40mg  daily  F/u 1 month for further medication titration    Antoine Poche, M.D., F.A.C.C.

## 2014-03-24 ENCOUNTER — Ambulatory Visit (INDEPENDENT_AMBULATORY_CARE_PROVIDER_SITE_OTHER): Payer: Medicare Other | Admitting: *Deleted

## 2014-03-24 DIAGNOSIS — I482 Chronic atrial fibrillation, unspecified: Secondary | ICD-10-CM

## 2014-03-24 DIAGNOSIS — I4891 Unspecified atrial fibrillation: Secondary | ICD-10-CM

## 2014-03-24 DIAGNOSIS — Z5181 Encounter for therapeutic drug level monitoring: Secondary | ICD-10-CM

## 2014-03-24 DIAGNOSIS — Z7901 Long term (current) use of anticoagulants: Secondary | ICD-10-CM

## 2014-03-24 LAB — POCT INR: INR: 3.4

## 2014-04-11 ENCOUNTER — Ambulatory Visit (INDEPENDENT_AMBULATORY_CARE_PROVIDER_SITE_OTHER): Payer: Medicare Other | Admitting: Cardiology

## 2014-04-11 ENCOUNTER — Encounter: Payer: Self-pay | Admitting: Cardiology

## 2014-04-11 VITALS — BP 116/80 | HR 77 | Wt 273.0 lb

## 2014-04-11 DIAGNOSIS — I2589 Other forms of chronic ischemic heart disease: Secondary | ICD-10-CM

## 2014-04-11 DIAGNOSIS — E785 Hyperlipidemia, unspecified: Secondary | ICD-10-CM

## 2014-04-11 DIAGNOSIS — I4891 Unspecified atrial fibrillation: Secondary | ICD-10-CM

## 2014-04-11 DIAGNOSIS — I5022 Chronic systolic (congestive) heart failure: Secondary | ICD-10-CM

## 2014-04-11 MED ORDER — METOPROLOL SUCCINATE ER 50 MG PO TB24: 50.0000 mg | ORAL_TABLET | Freq: Two times a day (BID) | ORAL | Status: DC

## 2014-04-11 NOTE — Patient Instructions (Signed)
   Increased Toprol XL to 50mg  twice a day. Sent to pharmacy. Continue all other medications.   Follow up in 2 months.

## 2014-04-11 NOTE — Progress Notes (Signed)
Clinical Summary [name] is a 58 y.o.male seen today for follow up of the following medical problems.   1. Permanent afib  - denies any significant palpitations  - compliant with coumadin, no troubles with bleeding   2. Chronic systolic heart failure  - 03/2013 LVEF 40-45%.  - 02/25/14 Lexiscan MPI with fixed basal inferior defect, scar vs attenuation. LVEF 37%, basal inferior hypokinesis.  - has had troubles with compliance based on prior notes  -has just recently solidified his insurance and has committed to medication compliance.  - denies any SOB or DOE, no orthopnea, occas LE edema.   - last visit increased Toprol XL to 50mg  daily. Notes some bad dreams at times he questions if related to medication. Mild fatigue initially that resolved  3. Carotid stenosis  - followed by vascular, history of LICA chronic occlusion   4. Hyperlipidemia  - 02/2014 TC 184 TG 48 HDL 41 LDL 133  - last visit based on this panel changed zocor to atorva 40mg  daily.    Past Medical History  Diagnosis Date  . Hyperlipidemia   . Gilbert's syndrome   . Vertigo   . Diabetes mellitus     diet controlled  . Angina   . Hypertension   . Atrial fibrillation   . Atrial flutter 2007    h/o  . Stroke ~08/17/11    "lost vision left eye"  . Blood dyscrasia   . Coronary artery disease 08/19/11  . Peripheral vascular disease      Allergies  Allergen Reactions  . Other     Mayonnaise  . Penicillins Rash     Current Outpatient Prescriptions  Medication Sig Dispense Refill  . aspirin 81 MG tablet Take 81 mg by mouth daily.      Marland Kitchen. atorvastatin (LIPITOR) 40 MG tablet Take 1 tablet (40 mg total) by mouth daily.  30 tablet  6  . cholecalciferol (VITAMIN D) 1000 UNITS tablet Take 1,000 Units by mouth daily.      . furosemide (LASIX) 20 MG tablet Take 1 tablet (20 mg total) by mouth daily.  30 tablet  6  . lisinopril (PRINIVIL,ZESTRIL) 2.5 MG tablet Take 1 tablet (2.5 mg total) by mouth daily.   30 tablet  6  . metoprolol succinate (TOPROL-XL) 50 MG 24 hr tablet Take 1 tablet (50 mg total) by mouth daily.  30 tablet  6  . Omega-3 1000 MG CAPS Take by mouth.      . Omega-3 Fatty Acids (FISH OIL) 1000 MG CAPS Take 1,000 mg by mouth daily.      Marland Kitchen. warfarin (COUMADIN) 10 MG tablet Take 1 tablet daily except 1/2 tablet on Sundays and Wednesdays  30 tablet  2   No current facility-administered medications for this visit.     Past Surgical History  Procedure Laterality Date  . Achilles tendon repair  06/2002    Left  . Cardioversion  2007     Allergies  Allergen Reactions  . Other     Mayonnaise  . Penicillins Rash      Family History  Problem Relation Age of Onset  . Diabetes Mother   . Hyperlipidemia Mother   . Hypertension Mother   . Emphysema Father   . Heart failure Father   . Heart attack Brother   . Aneurysm Sister   . Deep vein thrombosis Sister   . Diabetes Sister   . Heart attack Sister      Social History  [name] reports that he quit smoking about 2 years ago. His smoking use included Cigars. He started smoking about 18 years ago. He has never used smokeless tobacco. [name] reports that he drinks about 1.8 ounces of alcohol per week.   Review of Systems CONSTITUTIONAL: No weight loss, fever, chills, weakness or fatigue.  HEENT: Eyes: No visual loss, blurred vision, double vision or yellow sclerae.No hearing loss, sneezing, congestion, runny nose or sore throat.  SKIN: No rash or itching.  CARDIOVASCULAR: per HPI RESPIRATORY: No shortness of breath, cough or sputum.  GASTROINTESTINAL: No anorexia, nausea, vomiting or diarrhea. No abdominal pain or blood.  GENITOURINARY: No burning on urination, no polyuria NEUROLOGICAL: No headache, dizziness, syncope, paralysis, ataxia, numbness or tingling in the extremities. No change in bowel or bladder control.  MUSCULOSKELETAL: No muscle, back pain, joint pain or stiffness.  LYMPHATICS: No enlarged nodes.  No history of splenectomy.  PSYCHIATRIC: No history of depression or anxiety.  ENDOCRINOLOGIC: No reports of sweating, cold or heat intolerance. No polyuria or polydipsia.  Marland Kitchen   Physical Examination p 77 bp 116/80 Wt 273 lbs BMI 273 lbs Gen: resting comfortably, no acute distress HEENT: no scleral icterus, pupils equal round and reactive, no palptable cervical adenopathy,  CV: irreg, no m/r/g, no jVD Resp: Clear to auscultation bilaterally GI: abdomen is soft, non-tender, non-distended, normal bowel sounds, no hepatosplenomegaly MSK: extremities are warm, no edema.  Skin: warm, no rash Neuro:  no focal deficits Psych: appropriate affect   Diagnostic Studies 03/2013 Echo  Study Conclusions  - Left ventricle: Technically difficult study. There was mild concentric hypertrophy. Systolic function was mildly to moderately reduced. The estimated ejection fraction was in the range of 40% to 45%. Diffuse hypokinesis. The study is not technically sufficient to allow evaluation of LV diastolic function. - Mitral valve: Mildly thickened leaflets . - Left atrium: Moderately dilated. - Right atrium: The atrium was mildly dilated.  02/2014 Lexiscan MPI  Analysis of the raw data did not demonstrate any significant  extracardiac radiotracer uptake. Analysis of the perfusion images  demonstrated a moderate size, moderately intense, basilar inferior  wall defect. Images were worse on rest than stress. Inferior soft  tissue attenuation also noted. Left ventricular systolic function  was moderately reduced, calculated LV EF 37%. The aforementioned  wall is hypokinetic.  IMPRESSION:  1. Abnormal exercise Cardiolite stress test.  2. Rapid atrial fibrillation noted with exercise.  3. Fixed basal inferior wall defect. While this may represent  myocardial scar, interfering soft tissue attenuation cannot entirely  be excluded.  4. Moderately reduced LV systolic function, calculated LVEF  37%.     Assessment and Plan  1. Afib  - no significant symptoms, continue Toprol and coumadin   2. Chronic systolic heart failure  - last LVEF 40-45% by echo 03/2013, NYHA II.  - based on non-invasive testing appears to be a possible ischemic component with evidence of prior scar vs artifact, no active ischemia. Has had no chest pain. - increase Toprol XL to 50mg  bid.  3. Carotid stenosis  - continue to follow with vascular   4. Hyperlipidemia  - recently changed to atorva 40mg  daily, check lipid panel at next visit   Screen OSA next visit  F/u 2 month for further medication titration       Antoine Poche, M.D., F.A.C.C.

## 2014-04-14 ENCOUNTER — Ambulatory Visit (INDEPENDENT_AMBULATORY_CARE_PROVIDER_SITE_OTHER): Payer: Medicare Other | Admitting: *Deleted

## 2014-04-14 DIAGNOSIS — Z7901 Long term (current) use of anticoagulants: Secondary | ICD-10-CM

## 2014-04-14 DIAGNOSIS — I482 Chronic atrial fibrillation, unspecified: Secondary | ICD-10-CM

## 2014-04-14 DIAGNOSIS — Z5181 Encounter for therapeutic drug level monitoring: Secondary | ICD-10-CM

## 2014-04-14 DIAGNOSIS — I4891 Unspecified atrial fibrillation: Secondary | ICD-10-CM

## 2014-04-14 LAB — POCT INR: INR: 1.9

## 2014-05-14 ENCOUNTER — Encounter: Payer: Self-pay | Admitting: *Deleted

## 2014-06-13 ENCOUNTER — Encounter: Payer: Self-pay | Admitting: Cardiology

## 2014-06-13 ENCOUNTER — Ambulatory Visit (INDEPENDENT_AMBULATORY_CARE_PROVIDER_SITE_OTHER): Payer: Medicare Other | Admitting: Cardiology

## 2014-06-13 ENCOUNTER — Other Ambulatory Visit: Payer: Self-pay | Admitting: *Deleted

## 2014-06-13 VITALS — BP 117/77 | HR 79 | Ht 73.0 in | Wt 265.0 lb

## 2014-06-13 DIAGNOSIS — I482 Chronic atrial fibrillation, unspecified: Secondary | ICD-10-CM

## 2014-06-13 DIAGNOSIS — G4730 Sleep apnea, unspecified: Secondary | ICD-10-CM

## 2014-06-13 DIAGNOSIS — I5022 Chronic systolic (congestive) heart failure: Secondary | ICD-10-CM

## 2014-06-13 DIAGNOSIS — I6529 Occlusion and stenosis of unspecified carotid artery: Secondary | ICD-10-CM

## 2014-06-13 DIAGNOSIS — E785 Hyperlipidemia, unspecified: Secondary | ICD-10-CM

## 2014-06-13 DIAGNOSIS — I2589 Other forms of chronic ischemic heart disease: Secondary | ICD-10-CM

## 2014-06-13 MED ORDER — METOPROLOL SUCCINATE ER 50 MG PO TB24: 50.0000 mg | ORAL_TABLET | Freq: Every day | ORAL | Status: DC

## 2014-06-13 MED ORDER — APIXABAN 5 MG PO TABS: 5.0000 mg | ORAL_TABLET | Freq: Two times a day (BID) | ORAL | Status: DC

## 2014-06-13 MED ORDER — METOPROLOL SUCCINATE ER 50 MG PO TB24: ORAL_TABLET | ORAL | Status: DC

## 2014-06-13 NOTE — Progress Notes (Signed)
Clinical Summary [name] is a 58 y.o.male seen today for follow up of the following medical problems.   1. Permanent afib  - denies any significant palpitations  - compliant with coumadin, no troubles with bleeding    2. Chronic systolic heart failure  - 03/2013 LVEF 40-45%.  - 02/25/14 Lexiscan MPI with fixed basal inferior defect, scar vs attenuation. LVEF 37%, basal inferior hypokinesis.  - denies any SOB or DOE, no orthopnea, occas LE edema.  - last visit increased Toprol XL to 50mg  bid. Notes some bad dreams he questions if related Toprol 50mg  bid. Otherwise no side effects.   3. Carotid stenosis  - followed by vascular, history of LICA chronic occlusion   4. Hyperlipidemia  - 02/2014 TC 184 TG 48 HDL 41 LDL 133  - recently changed to atorva 40mg  daily  5. OSA screen - + snore, + obesity. Denies somnolence. + afib, + chf  Past Medical History  Diagnosis Date  . Hyperlipidemia   . Gilbert's syndrome   . Vertigo   . Diabetes mellitus     diet controlled  . Angina   . Hypertension   . Atrial fibrillation   . Atrial flutter 2007    h/o  . Stroke ~08/17/11    "lost vision left eye"  . Blood dyscrasia   . Coronary artery disease 08/19/11  . Peripheral vascular disease      Allergies  Allergen Reactions  . Other     Mayonnaise  . Penicillins Rash     Current Outpatient Prescriptions  Medication Sig Dispense Refill  . aspirin 81 MG tablet Take 81 mg by mouth daily.      Marland Kitchen. atorvastatin (LIPITOR) 40 MG tablet Take 1 tablet (40 mg total) by mouth daily.  30 tablet  6  . cholecalciferol (VITAMIN D) 1000 UNITS tablet Take 1,000 Units by mouth daily.      . furosemide (LASIX) 20 MG tablet Take 1 tablet (20 mg total) by mouth daily.  30 tablet  6  . lisinopril (PRINIVIL,ZESTRIL) 2.5 MG tablet Take 1 tablet (2.5 mg total) by mouth daily.  30 tablet  6  . metoprolol succinate (TOPROL-XL) 50 MG 24 hr tablet Take 1 tablet (50 mg total) by mouth 2 (two) times daily.   30 tablet  6  . Omega-3 1000 MG CAPS Take by mouth.      . Omega-3 Fatty Acids (FISH OIL) 1000 MG CAPS Take 1,000 mg by mouth daily.      Marland Kitchen. warfarin (COUMADIN) 10 MG tablet Take 1 tablet daily except 1/2 tablet on Sundays and Wednesdays  30 tablet  2   No current facility-administered medications for this visit.     Past Surgical History  Procedure Laterality Date  . Achilles tendon repair  06/2002    Left  . Cardioversion  2007     Allergies  Allergen Reactions  . Other     Mayonnaise  . Penicillins Rash      Family History  Problem Relation Age of Onset  . Diabetes Mother   . Hyperlipidemia Mother   . Hypertension Mother   . Emphysema Father   . Heart failure Father   . Heart attack Brother   . Aneurysm Sister   . Deep vein thrombosis Sister   . Diabetes Sister   . Heart attack Sister      Social History [name] reports that he quit smoking about 2 years ago. His smoking use included  Cigars. He started smoking about 18 years ago. He has never used smokeless tobacco. [name] reports that he drinks about 1.8 ounces of alcohol per week.   Review of Systems CONSTITUTIONAL: No weight loss, fever, chills, weakness or fatigue.  HEENT: Eyes: No visual loss, blurred vision, double vision or yellow sclerae.No hearing loss, sneezing, congestion, runny nose or sore throat.  SKIN: No rash or itching.  CARDIOVASCULAR: per HPI RESPIRATORY: No shortness of breath, cough or sputum.  GASTROINTESTINAL: No anorexia, nausea, vomiting or diarrhea. No abdominal pain or blood.  GENITOURINARY: No burning on urination, no polyuria NEUROLOGICAL: No headache, dizziness, syncope, paralysis, ataxia, numbness or tingling in the extremities. No change in bowel or bladder control.  MUSCULOSKELETAL: No muscle, back pain, joint pain or stiffness.  LYMPHATICS: No enlarged nodes. No history of splenectomy.  PSYCHIATRIC: No history of depression or anxiety.  ENDOCRINOLOGIC: No reports of  sweating, cold or heat intolerance. No polyuria or polydipsia.  Marland Kitchen.   Physical Examination p 79 bp 117/77 Wt 265 lbs BMI 35 Gen: resting comfortably, no acute distress HEENT: no scleral icterus, pupils equal round and reactive, no palptable cervical adenopathy,  CV: irreg, no m/r/g, no JVD, no carotid bruits Resp: Clear to auscultation bilaterally GI: abdomen is soft, non-tender, non-distended, normal bowel sounds, no hepatosplenomegaly MSK: extremities are warm, no edema.  Skin: warm, no rash Neuro:  no focal deficits Psych: appropriate affect   Diagnostic Studies 03/2013 Echo  Study Conclusions  - Left ventricle: Technically difficult study. There was mild concentric hypertrophy. Systolic function was mildly to moderately reduced. The estimated ejection fraction was in the range of 40% to 45%. Diffuse hypokinesis. The study is not technically sufficient to allow evaluation of LV diastolic function. - Mitral valve: Mildly thickened leaflets . - Left atrium: Moderately dilated. - Right atrium: The atrium was mildly dilated.  02/2014 Lexiscan MPI  Analysis of the raw data did not demonstrate any significant  extracardiac radiotracer uptake. Analysis of the perfusion images  demonstrated a moderate size, moderately intense, basilar inferior  wall defect. Images were worse on rest than stress. Inferior soft  tissue attenuation also noted. Left ventricular systolic function  was moderately reduced, calculated LV EF 37%. The aforementioned  wall is hypokinetic.  IMPRESSION:  1. Abnormal exercise Cardiolite stress test.  2. Rapid atrial fibrillation noted with exercise.  3. Fixed basal inferior wall defect. While this may represent  myocardial scar, interfering soft tissue attenuation cannot entirely  be excluded.  4. Moderately reduced LV systolic function, calculated LVEF 37%.     Assessment and Plan   1. Afib  - no significant symptoms, continue Toprol for rate  control - he is interested in changing coumadin to eliquis. Will stop coumadin, in 3 days instructed to start eliquis 5mg  bid.   2. Chronic systolic heart failure  - last LVEF 40-45% by echo 03/2013, NYHA II.  - based on non-invasive testing appears to be a possible ischemic component with evidence of prior scar vs artifact, no active ischemia. Has had no chest pain.  - increase Toprol XL to 75 mg bid.   3. Carotid stenosis  - continue to follow with vascular   4. Hyperlipidemia  - recently changed to atorva 40mg  daily, continue current therapy.    5. OSA screen - several signs and symptoms of OSA, will refer to sleep medicine    F/u 1 months  Antoine PocheJonathan F. Aleysia Oltmann, M.D.

## 2014-06-13 NOTE — Patient Instructions (Addendum)
   Increase Toprol XL 75mg  twice a day   Stop Coumadin  In three days (Tuesday) Start Eliquis 5mg  twice a day. Samples given.  Referral sent to Dr. Andrey CampanileWilson  Follow up in one Month with Misty StanleyLisa  Follow up in one month with Dr. Wyline MoodBranch

## 2014-06-16 ENCOUNTER — Telehealth: Payer: Self-pay | Admitting: *Deleted

## 2014-06-16 NOTE — Telephone Encounter (Signed)
Pt notified of insurance coverage of dose increased metoprolol

## 2014-07-14 ENCOUNTER — Ambulatory Visit (INDEPENDENT_AMBULATORY_CARE_PROVIDER_SITE_OTHER): Payer: Medicare Other | Admitting: *Deleted

## 2014-07-14 DIAGNOSIS — I482 Chronic atrial fibrillation, unspecified: Secondary | ICD-10-CM

## 2014-07-14 DIAGNOSIS — Z5181 Encounter for therapeutic drug level monitoring: Secondary | ICD-10-CM

## 2014-07-14 DIAGNOSIS — I4891 Unspecified atrial fibrillation: Secondary | ICD-10-CM

## 2014-07-14 DIAGNOSIS — Z7901 Long term (current) use of anticoagulants: Secondary | ICD-10-CM

## 2014-07-14 LAB — POCT INR: INR: 0

## 2014-07-14 MED ORDER — WARFARIN SODIUM 10 MG PO TABS: ORAL_TABLET | ORAL | Status: DC

## 2014-07-23 ENCOUNTER — Ambulatory Visit (INDEPENDENT_AMBULATORY_CARE_PROVIDER_SITE_OTHER): Payer: Medicare Other | Admitting: *Deleted

## 2014-07-23 DIAGNOSIS — I4891 Unspecified atrial fibrillation: Secondary | ICD-10-CM

## 2014-07-23 DIAGNOSIS — I482 Chronic atrial fibrillation, unspecified: Secondary | ICD-10-CM

## 2014-07-23 DIAGNOSIS — Z5181 Encounter for therapeutic drug level monitoring: Secondary | ICD-10-CM

## 2014-07-23 DIAGNOSIS — Z7901 Long term (current) use of anticoagulants: Secondary | ICD-10-CM

## 2014-07-23 LAB — POCT INR: INR: 1.8

## 2014-08-11 ENCOUNTER — Ambulatory Visit (INDEPENDENT_AMBULATORY_CARE_PROVIDER_SITE_OTHER): Payer: Medicare Other | Admitting: *Deleted

## 2014-08-11 DIAGNOSIS — Z5181 Encounter for therapeutic drug level monitoring: Secondary | ICD-10-CM

## 2014-08-11 DIAGNOSIS — I482 Chronic atrial fibrillation, unspecified: Secondary | ICD-10-CM

## 2014-08-11 DIAGNOSIS — Z7901 Long term (current) use of anticoagulants: Secondary | ICD-10-CM

## 2014-08-11 DIAGNOSIS — I4891 Unspecified atrial fibrillation: Secondary | ICD-10-CM

## 2014-08-11 LAB — POCT INR: INR: 2.2

## 2014-08-13 ENCOUNTER — Encounter (HOSPITAL_COMMUNITY): Payer: Self-pay | Admitting: Vascular Surgery

## 2014-09-08 ENCOUNTER — Ambulatory Visit (INDEPENDENT_AMBULATORY_CARE_PROVIDER_SITE_OTHER): Payer: Medicare Other | Admitting: *Deleted

## 2014-09-08 DIAGNOSIS — I482 Chronic atrial fibrillation, unspecified: Secondary | ICD-10-CM

## 2014-09-08 DIAGNOSIS — Z7901 Long term (current) use of anticoagulants: Secondary | ICD-10-CM

## 2014-09-08 DIAGNOSIS — Z5181 Encounter for therapeutic drug level monitoring: Secondary | ICD-10-CM

## 2014-09-08 DIAGNOSIS — I4891 Unspecified atrial fibrillation: Secondary | ICD-10-CM

## 2014-09-08 LAB — POCT INR: INR: 2.3

## 2014-10-08 ENCOUNTER — Ambulatory Visit (INDEPENDENT_AMBULATORY_CARE_PROVIDER_SITE_OTHER): Payer: Medicare Other | Admitting: *Deleted

## 2014-10-08 DIAGNOSIS — Z7901 Long term (current) use of anticoagulants: Secondary | ICD-10-CM

## 2014-10-08 DIAGNOSIS — I482 Chronic atrial fibrillation, unspecified: Secondary | ICD-10-CM

## 2014-10-08 DIAGNOSIS — I4891 Unspecified atrial fibrillation: Secondary | ICD-10-CM

## 2014-10-08 DIAGNOSIS — Z5181 Encounter for therapeutic drug level monitoring: Secondary | ICD-10-CM

## 2014-10-08 LAB — POCT INR: INR: 1.8

## 2014-10-27 ENCOUNTER — Encounter: Payer: Self-pay | Admitting: *Deleted

## 2014-11-10 ENCOUNTER — Telehealth: Payer: Self-pay | Admitting: Cardiology

## 2014-11-10 MED ORDER — WARFARIN SODIUM 10 MG PO TABS: ORAL_TABLET | ORAL | Status: DC

## 2014-11-10 NOTE — Telephone Encounter (Signed)
Received fax refill request  Rx # [id number] Medication:  Coumadin 10 mg tab Qty 30 Sig:  Take one tablet by mouth once daily except take one half tablet on Sundays and Wednesdays or as directed  Physician:  Wyline MoodBranch

## 2014-11-13 ENCOUNTER — Ambulatory Visit: Payer: Medicare Other | Admitting: Cardiology

## 2014-11-13 NOTE — Progress Notes (Unsigned)
Clinical Summary [name] is a 59 y.o.male seen today for follow up of the following medical problems.   1. Permanent afib  - denies any significant palpitations  - compliant with coumadin, no troubles with bleeding    2. Chronic systolic heart failure  - 03/2013 LVEF 40-45%.  - 02/25/14 Lexiscan MPI with fixed basal inferior defect, scar vs attenuation. LVEF 37%, basal inferior hypokinesis.  - denies any SOB or DOE, no orthopnea, occas LE edema.  - last visit increased Toprol XL to  bid. Notes some bad dreams he questions if related Toprol  bid. Otherwise no side effects.   3. Carotid stenosis  - followed by vascular, history of LICA chronic occlusion   4. Hyperlipidemia  - 02/2014 TC 184 TG 48 HDL 41 LDL 133  - recently changed to atorva  daily  5. OSA screen - + snore, + obesity. Denies somnolence. + afib, + chf Past Medical History  Diagnosis Date  . Hyperlipidemia   . Gilbert's syndrome   . Vertigo   . Diabetes mellitus     diet controlled  . Angina   . Hypertension   . Atrial fibrillation   . Atrial flutter 2007    h/o  . Stroke ~08/17/11    "lost vision left eye"  . Blood dyscrasia   . Coronary artery disease 08/19/11  . Peripheral vascular disease      Allergies  Allergen Reactions  . Other     Mayonnaise  . Penicillins Rash     Current Outpatient Prescriptions  Medication Sig Dispense Refill  . aspirin 81 MG tablet Take 81 mg by mouth daily.    Marland Kitchen atorvastatin (LIPITOR) 40 MG tablet Take 1 tablet (40 mg total) by mouth daily. 30 tablet 6  . cholecalciferol (VITAMIN D) 1000 UNITS tablet Take 1,000 Units by mouth daily.    . furosemide (LASIX) 20 MG tablet Take 1 tablet (20 mg total) by mouth daily. 30 tablet 6  . lisinopril (PRINIVIL,ZESTRIL) 2.5 MG tablet Take 1 tablet (2.5 mg total) by mouth daily. 30 tablet 6  . metoprolol succinate (TOPROL-XL) 50 MG 24 hr tablet Take 1.5 ( ) tablet twice a day. 90 tablet 6  . Omega-3  1000 MG CAPS Take by mouth.    . Omega-3 Fatty Acids (FISH OIL) 1000 MG CAPS Take 1,000 mg by mouth daily.    Marland Kitchen warfarin (COUMADIN) 10 MG tablet Take 1 tablet daily except 1/2 tablet on Sundays or as directed 30 tablet 3   No current facility-administered medications for this visit.     Past Surgical History  Procedure Laterality Date  . Achilles tendon repair  06/2002    Left  . Cardioversion  2007  . Cerebral angiogram N/A 08/19/2011    Procedure: CEREBRAL ANGIOGRAM;  Surgeon: Chuck Hint, MD;  Location: [facility] CATH LAB;  Service: Cardiovascular;  Laterality: N/A;  . Carotid angiogram  08/19/2011    Procedure: CAROTID ANGIOGRAM;  Surgeon: Chuck Hint, MD;  Location: [facility] CATH LAB;  Service: Cardiovascular;;     Allergies  Allergen Reactions  . Other     Mayonnaise  . Penicillins Rash      Family History  Problem Relation Age of Onset  . Diabetes Mother   . Hyperlipidemia Mother   . Hypertension Mother   . Emphysema Father   . Heart failure Father   . Heart attack Brother   . Aneurysm Sister   . Deep vein thrombosis Sister   .  Diabetes Sister   . Heart attack Sister      Social History [name] reports that he quit smoking about 3 years ago. His smoking use included Cigars. He started smoking about 18 years ago. He has never used smokeless tobacco. [name] reports that he drinks about 1.8 oz of alcohol per week.   Review of Systems CONSTITUTIONAL: No weight loss, fever, chills, weakness or fatigue.  HEENT: Eyes: No visual loss, blurred vision, double vision or yellow sclerae.No hearing loss, sneezing, congestion, runny nose or sore throat.  SKIN: No rash or itching.  CARDIOVASCULAR:  RESPIRATORY: No shortness of breath, cough or sputum.  GASTROINTESTINAL: No anorexia, nausea, vomiting or diarrhea. No abdominal pain or blood.  GENITOURINARY: No burning on urination, no polyuria NEUROLOGICAL: No headache, dizziness, syncope, paralysis, ataxia,  numbness or tingling in the extremities. No change in bowel or bladder control.  MUSCULOSKELETAL: No muscle, back pain, joint pain or stiffness.  LYMPHATICS: No enlarged nodes. No history of splenectomy.  PSYCHIATRIC: No history of depression or anxiety.  ENDOCRINOLOGIC: No reports of sweating, cold or heat intolerance. No polyuria or polydipsia.  Marland Kitchen.   Physical Examination There were no vitals filed for this visit. There were no vitals filed for this visit.  Gen: resting comfortably, no acute distress HEENT: no scleral icterus, pupils equal round and reactive, no palptable cervical adenopathy,  CV Resp: Clear to auscultation bilaterally GI: abdomen is soft, non-tender, non-distended, normal bowel sounds, no hepatosplenomegaly MSK: extremities are warm, no edema.  Skin: warm, no rash Neuro:  no focal deficits Psych: appropriate affect   Diagnostic Studies 03/2013 Echo  Study Conclusions  - Left ventricle: Technically difficult study. There was mild concentric hypertrophy. Systolic function was mildly to moderately reduced. The estimated ejection fraction was in the range of 40% to 45%. Diffuse hypokinesis. The study is not technically sufficient to allow evaluation of LV diastolic function. - Mitral valve: Mildly thickened leaflets . - Left atrium: Moderately dilated. - Right atrium: The atrium was mildly dilated.  02/2014 Lexiscan MPI  Analysis of the raw data did not demonstrate any significant  extracardiac radiotracer uptake. Analysis of the perfusion images  demonstrated a moderate size, moderately intense, basilar inferior  wall defect. Images were worse on rest than stress. Inferior soft  tissue attenuation also noted. Left ventricular systolic function  was moderately reduced, calculated LV EF 37%. The aforementioned  wall is hypokinetic.  IMPRESSION:  1. Abnormal exercise Cardiolite stress test.  2. Rapid atrial fibrillation noted with exercise.  3.  Fixed basal inferior wall defect. While this may represent  myocardial scar, interfering soft tissue attenuation cannot entirely  be excluded.  4. Moderately reduced LV systolic function, calculated LVEF 37%.    Assessment and Plan  1. Afib  - no significant symptoms, continue Toprol for rate control - he is interested in changing coumadin to eliquis. Will stop coumadin, in 3 days instructed to start eliquis 5mg  bid.   2. Chronic systolic heart failure  - last LVEF 40-45% by echo 03/2013, NYHA II.  - based on non-invasive testing appears to be a possible ischemic component with evidence of prior scar vs artifact, no active ischemia. Has had no chest pain.  - increase Toprol XL to 75 mg bid.   3. Carotid stenosis  - continue to follow with vascular   4. Hyperlipidemia  - recently changed to atorva 40mg  daily, continue current therapy.    5. OSA screen - several signs and symptoms of OSA,  will refer to sleep medicine      Antoine Poche, M.D.

## 2014-11-27 ENCOUNTER — Ambulatory Visit (INDEPENDENT_AMBULATORY_CARE_PROVIDER_SITE_OTHER): Payer: Medicare Other | Admitting: Cardiology

## 2014-11-27 VITALS — BP 112/68 | HR 83 | Ht 73.0 in | Wt 265.8 lb

## 2014-11-27 DIAGNOSIS — E785 Hyperlipidemia, unspecified: Secondary | ICD-10-CM

## 2014-11-27 DIAGNOSIS — I5022 Chronic systolic (congestive) heart failure: Secondary | ICD-10-CM

## 2014-11-27 DIAGNOSIS — I4891 Unspecified atrial fibrillation: Secondary | ICD-10-CM

## 2014-11-27 DIAGNOSIS — G4733 Obstructive sleep apnea (adult) (pediatric): Secondary | ICD-10-CM | POA: Diagnosis not present

## 2014-11-27 NOTE — Progress Notes (Signed)
Clinical Summary [name] is a 59 y.o.male seen today for follow up of the following medical problems.   1. Permanent afib  - one isolated episode of palpitations that lasted 5 minutes, otherwise no issues - compliant with coumadin, no troubles with bleeding    2. Chronic systolic heart failure  - 03/2013 LVEF 40-45%.  - 02/25/14 Lexiscan MPI with fixed basal inferior defect, scar vs attenuation. LVEF 37%, basal inferior hypokinesis.  - denies any SOB or DOE, no orthopnea, occas LE edema. No chest pain.   3. Carotid stenosis  - followed by vascular, history of LICA chronic occlusion   4. Hyperlipidemia  - 02/2014 TC 184 TG 48 HDL 41 LDL 133  - compliant with statin  5. OSA screen - + snore, + obesity. Denies somnolence. + afib, + chf - has not had sleep study since last visit    Past Medical History  Diagnosis Date  . Hyperlipidemia   . Gilbert's syndrome   . Vertigo   . Diabetes mellitus     diet controlled  . Angina   . Hypertension   . Atrial fibrillation   . Atrial flutter 2007    h/o  . Stroke ~08/17/11    "lost vision left eye"  . Blood dyscrasia   . Coronary artery disease 08/19/11  . Peripheral vascular disease      Allergies  Allergen Reactions  . Other     Mayonnaise  . Penicillins Rash     Current Outpatient Prescriptions  Medication Sig Dispense Refill  . aspirin 81 MG tablet Take 81 mg by mouth daily.    Marland Kitchen. atorvastatin (LIPITOR) 40 MG tablet Take 1 tablet (40 mg total) by mouth daily. 30 tablet 6  . cholecalciferol (VITAMIN D) 1000 UNITS tablet Take 1,000 Units by mouth daily.    . furosemide (LASIX) 20 MG tablet Take 1 tablet (20 mg total) by mouth daily. 30 tablet 6  . lisinopril (PRINIVIL,ZESTRIL) 2.5 MG tablet Take 1 tablet (2.5 mg total) by mouth daily. 30 tablet 6  . metoprolol succinate (TOPROL-XL) 50 MG 24 hr tablet Take 1.5 (75mg ) tablet twice a day. 90 tablet 6  . Omega-3 1000 MG CAPS Take by mouth.    . Omega-3  Fatty Acids (FISH OIL) 1000 MG CAPS Take 1,000 mg by mouth daily.    Marland Kitchen. warfarin (COUMADIN) 10 MG tablet Take 1 tablet daily except 1/2 tablet on Sundays or as directed 30 tablet 3   No current facility-administered medications for this visit.     Past Surgical History  Procedure Laterality Date  . Achilles tendon repair  06/2002    Left  . Cardioversion  2007  . Cerebral angiogram N/A 08/19/2011    Procedure: CEREBRAL ANGIOGRAM;  Surgeon: Chuck Hinthristopher S Dickson, MD;  [facility];  Service: Cardiovascular;  Laterality: N/A;  . Carotid angiogram  08/19/2011    Procedure: CAROTID ANGIOGRAM;  Surgeon: Chuck Hinthristopher S Dickson, MD;  [facility];  Service: Cardiovascular;;     Allergies  Allergen Reactions  . Other     Mayonnaise  . Penicillins Rash      Family History  Problem Relation Age of Onset  . Diabetes Mother   . Hyperlipidemia Mother   . Hypertension Mother   . Emphysema Father   . Heart failure Father   . Heart attack Brother   . Aneurysm Sister   . Deep vein thrombosis Sister   . Diabetes Sister   .  Heart attack Sister      Social History [name] reports that he quit smoking about 3 years ago. His smoking use included Cigars. He started smoking about 18 years ago. He has never used smokeless tobacco. [name] reports that he drinks about 1.8 oz of alcohol per week.   Review of Systems CONSTITUTIONAL: No weight loss, fever, chills, weakness or fatigue.  HEENT: Eyes: No visual loss, blurred vision, double vision or yellow sclerae.No hearing loss, sneezing, congestion, runny nose or sore throat.  SKIN: No rash or itching.  CARDIOVASCULAR: per HPI RESPIRATORY: No shortness of breath, cough or sputum.  GASTROINTESTINAL: No anorexia, nausea, vomiting or diarrhea. No abdominal pain or blood.  GENITOURINARY: No burning on urination, no polyuria NEUROLOGICAL: No headache, dizziness, syncope, paralysis, ataxia, numbness or tingling in the  extremities. No change in bowel or bladder control.  MUSCULOSKELETAL: No muscle, back pain, joint pain or stiffness.  LYMPHATICS: No enlarged nodes. No history of splenectomy.  PSYCHIATRIC: No history of depression or anxiety.  ENDOCRINOLOGIC: No reports of sweating, cold or heat intolerance. No polyuria or polydipsia.  Marland Kitchen   Physical Examination p 83 bp 112/68 Wt 265 lbs BMI 35 Gen: resting comfortably, no acute distress HEENT: no scleral icterus, pupils equal round and reactive, no palptable cervical adenopathy,  CV: RRR, no m/r/g, no JVD, no carotid bruits Resp: Clear to auscultation bilaterally GI: abdomen is soft, non-tender, non-distended, normal bowel sounds, no hepatosplenomegaly MSK: extremities are warm, no edema.  Skin: warm, no rash Neuro:  no focal deficits Psych: appropriate affect   Diagnostic Studies 03/2013 Echo  Study Conclusions  - Left ventricle: Technically difficult study. There was mild concentric hypertrophy. Systolic function was mildly to moderately reduced. The estimated ejection fraction was in the range of 40% to 45%. Diffuse hypokinesis. The study is not technically sufficient to allow evaluation of LV diastolic function. - Mitral valve: Mildly thickened leaflets . - Left atrium: Moderately dilated. - Right atrium: The atrium was mildly dilated.  02/2014 Lexiscan MPI  Analysis of the raw data did not demonstrate any significant  extracardiac radiotracer uptake. Analysis of the perfusion images  demonstrated a moderate size, moderately intense, basilar inferior  wall defect. Images were worse on rest than stress. Inferior soft  tissue attenuation also noted. Left ventricular systolic function  was moderately reduced, calculated LV EF 37%. The aforementioned  wall is hypokinetic.  IMPRESSION:  1. Abnormal exercise Cardiolite stress test.  2. Rapid atrial fibrillation noted with exercise.  3. Fixed basal inferior wall defect. While this  may represent  myocardial scar, interfering soft tissue attenuation cannot entirely  be excluded.  4. Moderately reduced LV systolic function, calculated LVEF 37%.    Assessment and Plan  1. Afib  - no significant symptoms, continue Toprol for rate control and coumadin for stroke prevention   2. Chronic systolic heart failure  - last LVEF 40-45% by echo 03/2013, NYHA II.  - based on non-invasive testing appears to be a possible ischemic component with evidence of prior scar vs artifact, no active ischemia. Has had no chest pain.  -continue current meds  3. Carotid stenosis  - continue to follow with vascular   4. Hyperlipidemia  - continue statin, repeat lipid panel   5. OSA screen - several signs and symptoms of OSA. He has not seen sleep medicine yet, will refer again.     F/u 6 months    Antoine Poche, M.D.

## 2014-11-27 NOTE — Patient Instructions (Signed)
   Stop Omega Red Continue all other medications.   Referral to Dr. Juanetta GoslingHawkins for sleep apnea. Your physician wants you to follow up in: 6 months.  You will receive a reminder letter in the mail one-two months in advance.  If you don't receive a letter, please call our office to schedule the follow up appointment.  [name]

## 2014-11-29 ENCOUNTER — Encounter: Payer: Self-pay | Admitting: Cardiology

## 2014-12-09 ENCOUNTER — Other Ambulatory Visit: Payer: Self-pay | Admitting: Adult Health

## 2014-12-10 ENCOUNTER — Other Ambulatory Visit: Payer: Self-pay | Admitting: *Deleted

## 2014-12-10 ENCOUNTER — Other Ambulatory Visit: Payer: Self-pay

## 2014-12-10 DIAGNOSIS — I10 Essential (primary) hypertension: Secondary | ICD-10-CM

## 2014-12-10 MED ORDER — ATORVASTATIN CALCIUM 40 MG PO TABS: 40.0000 mg | ORAL_TABLET | Freq: Every day | ORAL | Status: DC

## 2014-12-10 MED ORDER — WARFARIN SODIUM 10 MG PO TABS: ORAL_TABLET | ORAL | Status: DC

## 2014-12-10 MED ORDER — LISINOPRIL 2.5 MG PO TABS: 2.5000 mg | ORAL_TABLET | Freq: Every day | ORAL | Status: DC

## 2014-12-10 NOTE — Telephone Encounter (Signed)
Refill complete 

## 2015-01-06 ENCOUNTER — Encounter: Payer: Self-pay | Admitting: *Deleted

## 2015-05-29 ENCOUNTER — Encounter: Payer: Self-pay | Admitting: Pharmacist

## 2015-05-29 ENCOUNTER — Encounter: Payer: Self-pay | Admitting: Cardiology

## 2015-05-29 ENCOUNTER — Ambulatory Visit (INDEPENDENT_AMBULATORY_CARE_PROVIDER_SITE_OTHER): Payer: Medicare Other | Admitting: Pharmacist

## 2015-05-29 ENCOUNTER — Ambulatory Visit (INDEPENDENT_AMBULATORY_CARE_PROVIDER_SITE_OTHER): Payer: Medicare Other | Admitting: Cardiology

## 2015-05-29 VITALS — BP 110/68 | HR 82 | Ht 73.0 in | Wt 258.4 lb

## 2015-05-29 DIAGNOSIS — Z5181 Encounter for therapeutic drug level monitoring: Secondary | ICD-10-CM

## 2015-05-29 DIAGNOSIS — I4891 Unspecified atrial fibrillation: Secondary | ICD-10-CM | POA: Diagnosis not present

## 2015-05-29 DIAGNOSIS — G4730 Sleep apnea, unspecified: Secondary | ICD-10-CM | POA: Diagnosis not present

## 2015-05-29 DIAGNOSIS — I482 Chronic atrial fibrillation, unspecified: Secondary | ICD-10-CM

## 2015-05-29 DIAGNOSIS — I5022 Chronic systolic (congestive) heart failure: Secondary | ICD-10-CM

## 2015-05-29 DIAGNOSIS — I6529 Occlusion and stenosis of unspecified carotid artery: Secondary | ICD-10-CM

## 2015-05-29 LAB — POCT INR: INR: 1.3

## 2015-05-29 MED ORDER — METOPROLOL SUCCINATE ER 100 MG PO TB24: 100.0000 mg | ORAL_TABLET | Freq: Two times a day (BID) | ORAL | Status: DC

## 2015-05-29 NOTE — Patient Instructions (Signed)
Your physician wants you to follow-up in: 4 months Dr Lurena Joiner will receive a reminder letter in the mail two months in advance. If you don't receive a letter, please call our office to schedule the follow-up appointment.   INCREASE Toprol to 100 mg twice a day   You have been referred to Dr Kari Baars pulmonology    Thank you for choosing [facility] !

## 2015-05-29 NOTE — Progress Notes (Signed)
Patient ID: [name], male   DOB: September 05, 1956, 59 y.o.   MRN: [id number]     Clinical Summary [name] is a 59 y.o.male seen today for follow up of the following medical problems.   1. Permanent afib  - denies any recent palpitations.  - compliant with coumadin, no troubles with bleeding    2. Chronic systolic heart failure  - 03/2013 LVEF 40-45%.  - 02/25/14 Lexiscan MPI with fixed basal inferior defect, scar vs attenuation. LVEF 37%, basal inferior hypokinesis.   - denies any SOB or DOE, no orthopnea, occas LE edema.  - compliant with meds. Limiting sodium intake.   3. Carotid stenosis  - followed by vascular, history of LICA chronic occlusion   4. Hyperlipidemia  - 02/2014 TC 184 TG 48 HDL 41 LDL 133  - compliant with statin  5. OSA screen - + snore, + obesity. Denies somnolence. + afib, + chf - has not had sleep study since last visit Past Medical History  Diagnosis Date  . Hyperlipidemia   . Gilbert's syndrome   . Vertigo   . Diabetes mellitus     diet controlled  . Angina   . Hypertension   . Atrial fibrillation   . Atrial flutter 2007    h/o  . Stroke ~08/17/11    "lost vision left eye"  . Blood dyscrasia   . Coronary artery disease 08/19/11  . Peripheral vascular disease      Allergies  Allergen Reactions  . Other     Mayonnaise  . Penicillins Rash     Current Outpatient Prescriptions  Medication Sig Dispense Refill  . aspirin 81 MG tablet Take 81 mg by mouth daily.    Marland Kitchen atorvastatin (LIPITOR) 40 MG tablet Take 1 tablet (40 mg total) by mouth daily. 30 tablet 6  . cholecalciferol (VITAMIN D) 1000 UNITS tablet Take 1,000 Units by mouth daily.    . furosemide (LASIX) 20 MG tablet Take 1 tablet (20 mg total) by mouth daily. 30 tablet 6  . furosemide (LASIX) 20 MG tablet TAKE ONE TABLET BY MOUTH ONCE DAILY 30 tablet 11  . lisinopril (PRINIVIL,ZESTRIL) 2.5 MG tablet Take 1 tablet (2.5 mg total) by mouth daily. 30 tablet 6  . metoprolol  succinate (TOPROL-XL) 50 MG 24 hr tablet Take 1.5 ( ) tablet twice a day. 90 tablet 6  . Omega-3 Fatty Acids (FISH OIL) 1000 MG CAPS Take 1,000 mg by mouth daily.    Marland Kitchen warfarin (COUMADIN) 10 MG tablet Take 1 tablet daily except 1/2 tablet on Sundays or as directed 30 tablet 3   No current facility-administered medications for this visit.     Past Surgical History  Procedure Laterality Date  . Achilles tendon repair  06/2002    Left  . Cardioversion  2007  . Cerebral angiogram N/A 08/19/2011    Procedure: CEREBRAL ANGIOGRAM;  Surgeon: Chuck Hint, MD;  Location: [facility] CATH LAB;  Service: Cardiovascular;  Laterality: N/A;  . Carotid angiogram  08/19/2011    Procedure: CAROTID ANGIOGRAM;  Surgeon: Chuck Hint, MD;  Location: [facility] CATH LAB;  Service: Cardiovascular;;     Allergies  Allergen Reactions  . Other     Mayonnaise  . Penicillins Rash      Family History  Problem Relation Age of Onset  . Diabetes Mother   . Hyperlipidemia Mother   . Hypertension Mother   . Emphysema Father   . Heart failure Father   . Heart attack Brother   .  Aneurysm Sister   . Deep vein thrombosis Sister   . Diabetes Sister   . Heart attack Sister      Social History [name] reports that he quit smoking about 3 years ago. His smoking use included Cigars. He started smoking about 19 years ago. He has never used smokeless tobacco. [name] reports that he drinks about 1.8 oz of alcohol per week.   Review of Systems CONSTITUTIONAL: No weight loss, fever, chills, weakness or fatigue.  HEENT: Eyes: No visual loss, blurred vision, double vision or yellow sclerae.No hearing loss, sneezing, congestion, runny nose or sore throat.  SKIN: No rash or itching.  CARDIOVASCULAR: per HPI RESPIRATORY: No shortness of breath, cough or sputum.  GASTROINTESTINAL: No anorexia, nausea, vomiting or diarrhea. No abdominal pain or blood.  GENITOURINARY: No burning on urination, no  polyuria NEUROLOGICAL: No headache, dizziness, syncope, paralysis, ataxia, numbness or tingling in the extremities. No change in bowel or bladder control.  MUSCULOSKELETAL: No muscle, back pain, joint pain or stiffness.  LYMPHATICS: No enlarged nodes. No history of splenectomy.  PSYCHIATRIC: No history of depression or anxiety.  ENDOCRINOLOGIC: No reports of sweating, cold or heat intolerance. No polyuria or polydipsia.  Marland Kitchen   Physical Examination Filed Vitals:   05/29/15 0900  BP: 110/68  Pulse: 82   Filed Vitals:   05/29/15 0900  Height:  (1.854 m)  Weight: 258 lb 6.4 oz (117.209 kg)    Gen: resting comfortably, no acute distress HEENT: no scleral icterus, pupils equal round and reactive, no palptable cervical adenopathy,  CV: RRR, no m/r/g, no JVD Resp: Clear to auscultation bilaterally GI: abdomen is soft, non-tender, non-distended, normal bowel sounds, no hepatosplenomegaly MSK: extremities are warm, no edema.  Skin: warm, no rash Neuro:  no focal deficits Psych: appropriate affect   Diagnostic Studies 03/2013 Echo  Study Conclusions  - Left ventricle: Technically difficult study. There was mild concentric hypertrophy. Systolic function was mildly to moderately reduced. The estimated ejection fraction was in the range of 40% to 45%. Diffuse hypokinesis. The study is not technically sufficient to allow evaluation of LV diastolic function. - Mitral valve: Mildly thickened leaflets . - Left atrium: Moderately dilated. - Right atrium: The atrium was mildly dilated.  02/2014 Lexiscan MPI  Analysis of the raw data did not demonstrate any significant  extracardiac radiotracer uptake. Analysis of the perfusion images  demonstrated a moderate size, moderately intense, basilar inferior  wall defect. Images were worse on rest than stress. Inferior soft  tissue attenuation also noted. Left ventricular systolic function  was moderately reduced, calculated LV EF  37%. The aforementioned  wall is hypokinetic.  IMPRESSION:  1. Abnormal exercise Cardiolite stress test.  2. Rapid atrial fibrillation noted with exercise.  3. Fixed basal inferior wall defect. While this may represent  myocardial scar, interfering soft tissue attenuation cannot entirely  be excluded.  4. Moderately reduced LV systolic function, calculated LVEF 37%.      Assessment and Plan  1. Afib  - no significant symptoms, continue current meds  2. Chronic systolic heart failure  - last LVEF 40-45% by echo 03/2013, NYHA II.  - based on non-invasive testing appears to be a possible ischemic component with evidence of prior scar vs artifact, no active ischemia. Has had no chest pain.  -will increase Toprol XL to  bid  3. Carotid stenosis  - continue to follow with vascular  - he will call [facility] to schedule his f/u  4. Hyperlipidemia  -  continue statin, request most recent labs from pcp  5. OSA screen - several signs and symptoms of OSA. He has not seen sleep medicine yet since last visit, will send another referral.          Antoine Poche, M.D.

## 2015-05-29 NOTE — Progress Notes (Signed)
This encounter was created in error - please disregard.

## 2015-06-03 ENCOUNTER — Emergency Department (HOSPITAL_COMMUNITY)
Admission: EM | Admit: 2015-06-03 | Discharge: 2015-06-04 | Disposition: A | Discharge status: Home / Self Care | Payer: No Typology Code available for payment source | Attending: Emergency Medicine | Admitting: Emergency Medicine

## 2015-06-03 ENCOUNTER — Encounter (HOSPITAL_COMMUNITY): Payer: Self-pay | Admitting: Emergency Medicine

## 2015-06-03 ENCOUNTER — Emergency Department (HOSPITAL_COMMUNITY): Payer: No Typology Code available for payment source

## 2015-06-03 ENCOUNTER — Encounter: Payer: Self-pay | Admitting: *Deleted

## 2015-06-03 DIAGNOSIS — S80211A Abrasion, right knee, initial encounter: Secondary | ICD-10-CM | POA: Diagnosis not present

## 2015-06-03 DIAGNOSIS — Y998 Other external cause status: Secondary | ICD-10-CM | POA: Insufficient documentation

## 2015-06-03 DIAGNOSIS — S2231XA Fracture of one rib, right side, initial encounter for closed fracture: Secondary | ICD-10-CM

## 2015-06-03 DIAGNOSIS — Y92410 Unspecified street and highway as the place of occurrence of the external cause: Secondary | ICD-10-CM | POA: Insufficient documentation

## 2015-06-03 DIAGNOSIS — Z79899 Other long term (current) drug therapy: Secondary | ICD-10-CM | POA: Diagnosis not present

## 2015-06-03 DIAGNOSIS — Z87891 Personal history of nicotine dependence: Secondary | ICD-10-CM | POA: Diagnosis not present

## 2015-06-03 DIAGNOSIS — E785 Hyperlipidemia, unspecified: Secondary | ICD-10-CM | POA: Insufficient documentation

## 2015-06-03 DIAGNOSIS — E119 Type 2 diabetes mellitus without complications: Secondary | ICD-10-CM | POA: Insufficient documentation

## 2015-06-03 DIAGNOSIS — Z23 Encounter for immunization: Secondary | ICD-10-CM | POA: Diagnosis not present

## 2015-06-03 DIAGNOSIS — Z880 Allergy status to penicillin: Secondary | ICD-10-CM | POA: Diagnosis not present

## 2015-06-03 DIAGNOSIS — I10 Essential (primary) hypertension: Secondary | ICD-10-CM | POA: Diagnosis not present

## 2015-06-03 DIAGNOSIS — Z8673 Personal history of transient ischemic attack (TIA), and cerebral infarction without residual deficits: Secondary | ICD-10-CM | POA: Insufficient documentation

## 2015-06-03 DIAGNOSIS — S299XXA Unspecified injury of thorax, initial encounter: Secondary | ICD-10-CM | POA: Diagnosis present

## 2015-06-03 DIAGNOSIS — I4891 Unspecified atrial fibrillation: Secondary | ICD-10-CM | POA: Diagnosis not present

## 2015-06-03 DIAGNOSIS — Z7901 Long term (current) use of anticoagulants: Secondary | ICD-10-CM | POA: Insufficient documentation

## 2015-06-03 DIAGNOSIS — Z7982 Long term (current) use of aspirin: Secondary | ICD-10-CM | POA: Diagnosis not present

## 2015-06-03 DIAGNOSIS — I25119 Atherosclerotic heart disease of native coronary artery with unspecified angina pectoris: Secondary | ICD-10-CM | POA: Insufficient documentation

## 2015-06-03 DIAGNOSIS — Z862 Personal history of diseases of the blood and blood-forming organs and certain disorders involving the immune mechanism: Secondary | ICD-10-CM | POA: Insufficient documentation

## 2015-06-03 DIAGNOSIS — S4991XA Unspecified injury of right shoulder and upper arm, initial encounter: Secondary | ICD-10-CM | POA: Diagnosis not present

## 2015-06-03 DIAGNOSIS — Y9389 Activity, other specified: Secondary | ICD-10-CM | POA: Insufficient documentation

## 2015-06-03 LAB — I-STAT CHEM 8, ED
BUN: 25 mg/dL — AB (ref 6–20)
CALCIUM ION: 1.27 mmol/L — AB (ref 1.12–1.23)
CHLORIDE: 107 mmol/L (ref 101–111)
Creatinine, Ser: 1.5 mg/dL — ABNORMAL HIGH (ref 0.61–1.24)
GLUCOSE: 105 mg/dL — AB (ref 65–99)
HCT: 45 % (ref 39.0–52.0)
Hemoglobin: 15.3 g/dL (ref 13.0–17.0)
Potassium: 3.7 mmol/L (ref 3.5–5.1)
Sodium: 142 mmol/L (ref 135–145)
TCO2: 22 mmol/L (ref 0–100)

## 2015-06-03 LAB — PROTIME-INR
INR: 1.76 — ABNORMAL HIGH (ref 0.00–1.49)
Prothrombin Time: 20.5 seconds — ABNORMAL HIGH (ref 11.6–15.2)

## 2015-06-03 MED ORDER — IOHEXOL 300 MG/ML  SOLN
100.0000 mL | Freq: Once | INTRAMUSCULAR | Status: AC | PRN
  Administered 2015-06-03: 100 mL via INTRAVENOUS

## 2015-06-03 MED ORDER — HYDROCODONE-ACETAMINOPHEN 5-325 MG PO TABS: 1.0000 | ORAL_TABLET | ORAL | Status: DC | PRN

## 2015-06-03 MED ORDER — ACETAMINOPHEN 325 MG PO TABS
650.0000 mg | ORAL_TABLET | Freq: Once | ORAL | Status: AC
Start: 2015-06-04 — End: 2015-06-03
  Administered 2015-06-03: 650 mg via ORAL
  Filled 2015-06-03: qty 2

## 2015-06-03 MED ORDER — TETANUS-DIPHTH-ACELL PERTUSSIS 5-2.5-18.5 LF-MCG/0.5 IM SUSP
0.5000 mL | Freq: Once | INTRAMUSCULAR | Status: AC
  Administered 2015-06-03: 0.5 mL via INTRAMUSCULAR
  Filled 2015-06-03: qty 0.5

## 2015-06-03 MED ORDER — MORPHINE SULFATE (PF) 4 MG/ML IV SOLN
4.0000 mg | Freq: Once | INTRAVENOUS | Status: DC
  Filled 2015-06-03: qty 1
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
## 2015-06-03 NOTE — ED Notes (Signed)
Pt states he does not have pain until he sits up or stands and feels that morphine is too strong. Pt instructed to inform RN if he changes his mind and needs pain medication.

## 2015-06-03 NOTE — ED Notes (Signed)
EKG done and given to EDP 
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
## 2015-06-03 NOTE — ED Notes (Signed)
Pt and family updated on plan of care,  

## 2015-06-04 NOTE — ED Notes (Signed)
Pt ambulatory with no distress noted,

## 2015-06-17 ENCOUNTER — Ambulatory Visit (INDEPENDENT_AMBULATORY_CARE_PROVIDER_SITE_OTHER): Payer: Medicare Other | Admitting: Cardiology

## 2015-06-17 ENCOUNTER — Ambulatory Visit (INDEPENDENT_AMBULATORY_CARE_PROVIDER_SITE_OTHER): Payer: Medicare Other | Admitting: *Deleted

## 2015-06-17 ENCOUNTER — Encounter: Payer: Self-pay | Admitting: Cardiology

## 2015-06-17 VITALS — BP 112/74 | HR 74 | Ht 73.0 in | Wt 259.2 lb

## 2015-06-17 DIAGNOSIS — I6529 Occlusion and stenosis of unspecified carotid artery: Secondary | ICD-10-CM | POA: Diagnosis not present

## 2015-06-17 DIAGNOSIS — I4891 Unspecified atrial fibrillation: Secondary | ICD-10-CM | POA: Diagnosis not present

## 2015-06-17 DIAGNOSIS — Z5181 Encounter for therapeutic drug level monitoring: Secondary | ICD-10-CM

## 2015-06-17 DIAGNOSIS — I5022 Chronic systolic (congestive) heart failure: Secondary | ICD-10-CM

## 2015-06-17 LAB — POCT INR: INR: 2.1

## 2015-06-17 NOTE — Patient Instructions (Addendum)
Medication Instructions:  Your physician recommends that you continue on your current medications as directed. Please refer to the Current Medication list given to you today.  Labwork: None ordered  Testing/Procedures: Your physician has requested that you have an echocardiogram. Echocardiography is a painless test that uses sound waves to create images of your heart. It provides your doctor with information about the size and shape of your heart and how well your heart's chambers and valves are working. This procedure takes approximately one hour. There are no restrictions for this procedure.  Follow-Up: Your physician recommends that you schedule a follow-up appointment in: 4 weeks with Dr. Wyline MoodBranch.  Any Other Special Instructions Will Be Listed Below (If Applicable). Thank you for choosing [facility]!!
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
## 2015-06-23 ENCOUNTER — Ambulatory Visit (HOSPITAL_COMMUNITY): Payer: Medicare Other | Attending: Cardiology

## 2015-07-08 ENCOUNTER — Ambulatory Visit (INDEPENDENT_AMBULATORY_CARE_PROVIDER_SITE_OTHER): Payer: Medicare Other | Admitting: *Deleted

## 2015-07-08 DIAGNOSIS — Z5181 Encounter for therapeutic drug level monitoring: Secondary | ICD-10-CM

## 2015-07-08 DIAGNOSIS — I4891 Unspecified atrial fibrillation: Secondary | ICD-10-CM | POA: Diagnosis not present

## 2015-07-08 LAB — POCT INR: INR: 2.7

## 2015-07-10 ENCOUNTER — Ambulatory Visit (HOSPITAL_COMMUNITY)
Admission: RE | Admit: 2015-07-10 | Discharge: 2015-07-10 | Disposition: A | Discharge status: Home / Self Care | Payer: Medicare Other | Source: Ambulatory Visit | Attending: Cardiology | Admitting: Cardiology

## 2015-07-10 DIAGNOSIS — I10 Essential (primary) hypertension: Secondary | ICD-10-CM | POA: Insufficient documentation

## 2015-07-10 DIAGNOSIS — R079 Chest pain, unspecified: Secondary | ICD-10-CM

## 2015-07-10 NOTE — Addendum Note (Signed)
Addended by: Marlyn CorporalARLTON, CATHERINE A on: 07/10/2015 10:22 AM   Modules accepted: Orders

## 2015-08-03 ENCOUNTER — Encounter: Payer: Medicare Other | Admitting: Cardiology
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
## 2015-08-05 ENCOUNTER — Encounter: Payer: Self-pay | Admitting: Cardiology

## 2015-08-05 ENCOUNTER — Ambulatory Visit (INDEPENDENT_AMBULATORY_CARE_PROVIDER_SITE_OTHER): Payer: Medicare Other | Admitting: Cardiology

## 2015-08-05 ENCOUNTER — Ambulatory Visit (INDEPENDENT_AMBULATORY_CARE_PROVIDER_SITE_OTHER): Payer: Medicare Other | Admitting: *Deleted

## 2015-08-05 VITALS — BP 126/68 | HR 94 | Ht 73.0 in | Wt 261.0 lb

## 2015-08-05 DIAGNOSIS — I4891 Unspecified atrial fibrillation: Secondary | ICD-10-CM

## 2015-08-05 DIAGNOSIS — I5022 Chronic systolic (congestive) heart failure: Secondary | ICD-10-CM | POA: Diagnosis not present

## 2015-08-05 DIAGNOSIS — Z5181 Encounter for therapeutic drug level monitoring: Secondary | ICD-10-CM | POA: Diagnosis not present

## 2015-08-05 DIAGNOSIS — I6529 Occlusion and stenosis of unspecified carotid artery: Secondary | ICD-10-CM

## 2015-08-05 LAB — POCT INR: INR: 1.4

## 2015-08-05 MED ORDER — APIXABAN 5 MG PO TABS: 5.0000 mg | ORAL_TABLET | Freq: Two times a day (BID) | ORAL | Status: DC

## 2015-08-05 NOTE — Progress Notes (Signed)
Patient ID: [name], male   DOB: 04/08/1956, 59 y.o.   MRN: [id number]     Clinical Summary [name] is a 59 y.o.male seen today for follow up of the following medical problems.   1. Chronic systolic heart failure  - 03/2013 LVEF 40-45%.  - repeat echo 07/2015 LVEF 45-50%, no WMA - 02/25/14 Lexiscan MPI with fixed basal inferior defect, scar vs attenuation. LVEF 37%, basal inferior hypokinesis.   - denies any SOB or DOE, no orthopnea, occas LE edema.  - compliant with meds. Limiting sodium intake.  2. Permanent afib  - denies any recent palpitations.  - compliant with coumadin, no troubles with bleeding. He is not interested in NOACs due to cost.   3. Carotid stenosis  - followed by vascular, history of LICA chronic occlusion   4. Hyperlipidemia  - 02/2014 TC 184 TG 48 HDL 41 LDL 133  - compliant with statin  5. OSA screen - + snore, + obesity. Denies somnolence. + afib, + chf - he has not called to schedule his appt with Dr Juanetta GoslingHawkins for OSA evaluation since last visit   Past Medical History  Diagnosis Date  . Hyperlipidemia   . Gilbert's syndrome   . Vertigo   . Diabetes mellitus     diet controlled  . Angina   . Hypertension   . Atrial fibrillation (HCC)   . Atrial flutter (HCC) 2007    h/o  . Stroke [facility]) ~08/17/11    "lost vision left eye"  . Blood dyscrasia   . Coronary artery disease 08/19/11  . Peripheral vascular disease (HCC)      Allergies  Allergen Reactions  . Penicillins Rash     Current Outpatient Prescriptions  Medication Sig Dispense Refill  . aspirin 81 MG tablet Take 81 mg by mouth daily.    Marland Kitchen. atorvastatin (LIPITOR) 40 MG tablet Take 1 tablet (40 mg total) by mouth daily. 30 tablet 6  . cholecalciferol (VITAMIN D) 1000 UNITS tablet Take 2,000 Units by mouth daily.     . furosemide (LASIX) 20 MG tablet TAKE ONE TABLET BY MOUTH ONCE DAILY 30 tablet 11  . HYDROcodone-acetaminophen (NORCO/VICODIN) 5-325 MG tablet Take 1 tablet  by mouth every 4 (four) hours as needed. 10 tablet 0  . lisinopril (PRINIVIL,ZESTRIL) 2.5 MG tablet Take 1 tablet (2.5 mg total) by mouth daily. 30 tablet 6  . Menthol-Methyl Salicylate (MUSCLE RUB) 10-15 % CREA Apply 1 application topically as needed for muscle pain.    . metoprolol succinate (TOPROL-XL) 100 MG 24 hr tablet Take 1 tablet (100 mg total) by mouth 2 (two) times daily. Take with or immediately following a meal. 180 tablet 3  . Omega-3 Fatty Acids (FISH OIL) 1000 MG CAPS Take 1,000 mg by mouth daily.    Marland Kitchen. warfarin (COUMADIN) 10 MG tablet Take 1 tablet daily except 1/2 tablet on Sundays or as directed (Patient taking differently: Take 5-10 mg by mouth See admin instructions. Take 1 tablet daily except 1/2 tablet on Sundays or as directed) 30 tablet 3   No current facility-administered medications for this visit.     Past Surgical History  Procedure Laterality Date  . Achilles tendon repair  06/2002    Left  . Cardioversion  2007  . Cerebral angiogram N/A 08/19/2011    Procedure: CEREBRAL ANGIOGRAM;  Surgeon: Chuck Hinthristopher S Dickson, MD;  [facility];  Service: Cardiovascular;  Laterality: N/A;  . Carotid angiogram  08/19/2011    Procedure:  CAROTID ANGIOGRAM;  Surgeon: Chuck Hint, MD;  Location: [facility] CATH LAB;  Service: Cardiovascular;;     Allergies  Allergen Reactions  . Penicillins Rash      Family History  Problem Relation Age of Onset  . Diabetes Mother   . Hyperlipidemia Mother   . Hypertension Mother   . Emphysema Father   . Heart failure Father   . Heart attack Brother   . Aneurysm Sister   . Deep vein thrombosis Sister   . Diabetes Sister   . Heart attack Sister      Social History [name] reports that he quit smoking about 3 years ago. His smoking use included Cigars. He started smoking about 19 years ago. He has never used smokeless tobacco. [name] reports that he drinks about 1.8 oz of alcohol per week.   Review of  Systems CONSTITUTIONAL: No weight loss, fever, chills, weakness or fatigue.  HEENT: Eyes: No visual loss, blurred vision, double vision or yellow sclerae.No hearing loss, sneezing, congestion, runny nose or sore throat.  SKIN: No rash or itching.  CARDIOVASCULAR: no chest pain ,no palpitations.  RESPIRATORY: No shortness of breath, cough or sputum.  GASTROINTESTINAL: No anorexia, nausea, vomiting or diarrhea. No abdominal pain or blood.  GENITOURINARY: No burning on urination, no polyuria NEUROLOGICAL: No headache, dizziness, syncope, paralysis, ataxia, numbness or tingling in the extremities. No change in bowel or bladder control.  MUSCULOSKELETAL: No muscle, back pain, joint pain or stiffness.  LYMPHATICS: No enlarged nodes. No history of splenectomy.  PSYCHIATRIC: No history of depression or anxiety.  ENDOCRINOLOGIC: No reports of sweating, cold or heat intolerance. No polyuria or polydipsia.  Marland Kitchen   Physical Examination Filed Vitals:   08/05/15 1343  BP: 126/68  Pulse: 94   Filed Vitals:   08/05/15 1343  Height:  (1.854 m)  Weight: 261 lb (118.389 kg)    Gen: resting comfortably, no acute distress HEENT: no scleral icterus, pupils equal round and reactive, no palptable cervical adenopathy,  CV: irreg, no m/r/g, no jvd Resp: Clear to auscultation bilaterally GI: abdomen is soft, non-tender, non-distended, normal bowel sounds, no hepatosplenomegaly MSK: extremities are warm, no edema.  Skin: warm, no rash Neuro:  no focal deficits Psych: appropriate affect   Diagnostic Studies 03/2013 Echo  Study Conclusions  - Left ventricle: Technically difficult study. There was mild concentric hypertrophy. Systolic function was mildly to moderately reduced. The estimated ejection fraction was in the range of 40% to 45%. Diffuse hypokinesis. The study is not technically sufficient to allow evaluation of LV diastolic function. - Mitral valve: Mildly thickened leaflets . - Left  atrium: Moderately dilated. - Right atrium: The atrium was mildly dilated.  02/2014 Lexiscan MPI  Analysis of the raw data did not demonstrate any significant  extracardiac radiotracer uptake. Analysis of the perfusion images  demonstrated a moderate size, moderately intense, basilar inferior  wall defect. Images were worse on rest than stress. Inferior soft  tissue attenuation also noted. Left ventricular systolic function  was moderately reduced, calculated LV EF 37%. The aforementioned  wall is hypokinetic.  IMPRESSION:  1. Abnormal exercise Cardiolite stress test.  2. Rapid atrial fibrillation noted with exercise.  3. Fixed basal inferior wall defect. While this may represent  myocardial scar, interfering soft tissue attenuation cannot entirely  be excluded.  4. Moderately reduced LV systolic function, calculated LVEF 37%.  07/2015 echo Study Conclusions  - Left ventricle: The cavity size was normal. Wall thickness was normal. Systolic  function was mildly reduced. The estimated ejection fraction was in the range of 45% to 50%. Wall motion was normal; there were no regional wall motion abnormalities. - Aortic valve: Valve area (VTI): 2.39 cm^2. Valve area (Vmax): 2.29 cm^2. - Mitral valve: There was mild regurgitation. - Left atrium: The atrium was severely dilated. - Right atrium: The atrium was severely dilated. - Atrial septum: No defect or patent foramen ovale was identified. - Technically adequate study.     Assessment and Plan  1. Chronic systolic heart failure  - appears euvolemic, continue current meds  2. Afib  - no significant symptoms, continue current meds  3. Carotid stenosis  - continue to follow with vascular  - he was reminded again to call [facility] to schedule his f/u  4. Hyperlipidemia  - continue statin,  5. OSA screen - several signs and symptoms of OSA.  -reminded today to call and schedule his  evaluation     F/u 6 months  Antoine Poche, M.D

## 2015-08-05 NOTE — Patient Instructions (Signed)
Medication Instructions:  ELIQUIS 5 MG TWICE DAILY * I SENT IN A PRESCRIPTION TO SEE HOW MUCH IT COSTS, CALL US AND LET US KNOW IF IT IS AFFORDABLE*  Labwork: NONE  Testing/Procedures: NONE  Follow-Up: Your physician wants you to follow-up in: 6 MONTHS WITH DR. BRANCH.  You will receive a reminder letter in the mail two months in advance. If you don't receive a letter, please call our office to schedule the follow-up appointment.   Any Other Special Instructions Will Be Listed Below (If Applicable).     If you need a refill on your cardiac medications before your next appointment, please call your pharmacy.  ELI

## 2015-08-12 ENCOUNTER — Other Ambulatory Visit: Payer: Self-pay | Admitting: Cardiology

## 2015-08-19 ENCOUNTER — Ambulatory Visit (INDEPENDENT_AMBULATORY_CARE_PROVIDER_SITE_OTHER): Payer: Medicare Other | Admitting: *Deleted

## 2015-08-19 DIAGNOSIS — I4891 Unspecified atrial fibrillation: Secondary | ICD-10-CM

## 2015-08-19 DIAGNOSIS — Z5181 Encounter for therapeutic drug level monitoring: Secondary | ICD-10-CM

## 2015-08-19 LAB — POCT INR: INR: 2

## 2015-08-20 ENCOUNTER — Other Ambulatory Visit: Payer: Self-pay | Admitting: *Deleted

## 2015-08-20 MED ORDER — WARFARIN SODIUM 10 MG PO TABS: ORAL_TABLET | ORAL | Status: DC

## 2015-09-23 ENCOUNTER — Ambulatory Visit (INDEPENDENT_AMBULATORY_CARE_PROVIDER_SITE_OTHER): Payer: Medicare HMO | Admitting: Pharmacist

## 2015-09-23 DIAGNOSIS — I4891 Unspecified atrial fibrillation: Secondary | ICD-10-CM | POA: Diagnosis not present

## 2015-09-23 DIAGNOSIS — Z5181 Encounter for therapeutic drug level monitoring: Secondary | ICD-10-CM | POA: Diagnosis not present

## 2015-09-23 LAB — POCT INR: INR: 3

## 2015-09-23 MED ORDER — WARFARIN SODIUM 10 MG PO TABS: ORAL_TABLET | ORAL | Status: DC

## 2015-10-28 ENCOUNTER — Ambulatory Visit (INDEPENDENT_AMBULATORY_CARE_PROVIDER_SITE_OTHER): Payer: Medicare HMO | Admitting: *Deleted

## 2015-10-28 DIAGNOSIS — I4891 Unspecified atrial fibrillation: Secondary | ICD-10-CM | POA: Diagnosis not present

## 2015-10-28 DIAGNOSIS — Z5181 Encounter for therapeutic drug level monitoring: Secondary | ICD-10-CM | POA: Diagnosis not present

## 2015-10-28 LAB — POCT INR: INR: 1.4

## 2015-11-04 ENCOUNTER — Ambulatory Visit (INDEPENDENT_AMBULATORY_CARE_PROVIDER_SITE_OTHER): Payer: Medicare HMO | Admitting: Cardiology

## 2015-11-04 ENCOUNTER — Ambulatory Visit (INDEPENDENT_AMBULATORY_CARE_PROVIDER_SITE_OTHER): Payer: Medicare HMO | Admitting: *Deleted

## 2015-11-04 ENCOUNTER — Encounter: Payer: Self-pay | Admitting: Cardiology

## 2015-11-04 VITALS — BP 112/66 | HR 81 | Ht 73.0 in | Wt 268.0 lb

## 2015-11-04 DIAGNOSIS — I6523 Occlusion and stenosis of bilateral carotid arteries: Secondary | ICD-10-CM

## 2015-11-04 DIAGNOSIS — I4891 Unspecified atrial fibrillation: Secondary | ICD-10-CM

## 2015-11-04 DIAGNOSIS — G4730 Sleep apnea, unspecified: Secondary | ICD-10-CM

## 2015-11-04 DIAGNOSIS — I5022 Chronic systolic (congestive) heart failure: Secondary | ICD-10-CM

## 2015-11-04 DIAGNOSIS — Z5181 Encounter for therapeutic drug level monitoring: Secondary | ICD-10-CM

## 2015-11-04 LAB — POCT INR: INR: 3

## 2015-11-04 NOTE — Progress Notes (Signed)
Patient ID: [name], male   DOB: 10/06/1955, 60 y.o.   MRN: [id number]     Clinical Summary [name] is a 60 y.o.male seen today for follow up of the following medical problems.   1. Chronic systolic heart failure  - 03/2013 LVEF 40-45%.  - repeat echo 07/2015 LVEF 45-50%, no WMA  - 02/25/14 Lexiscan MPI with fixed basal inferior defect, scar vs attenuation. LVEF 37%, basal inferior hypokinesis.  -  - denies any SOB or DOE. Compliant with meds. Limiting sodium.   2. Permanent afib  - denies any palpitations. No bleeding on coumadin. He has not been interested in NOACs due to cost.   3. Carotid stenosis  - followed by vascular, history of LICA chronic occlusion  - he has not followed up with vascular in sometime  4. Hyperlipidemia  - 04/2015 TC 135 TG 63 HDL 40 LDL 82 - compliant with statin   5. OSA screen  - + snore, + obesity. Denies somnolence. + afib, + chf  - he has not called to schedule his appt with Dr Juanetta Gosling for OSA evaluation since last visit    SH: has with him today his son who is a Heritage manager, currently working at [employer]. He is interested in eventually getting his masters and pursuing a Medical illustrator.  Past Medical History  Diagnosis Date  . Hyperlipidemia   . Gilbert's syndrome   . Vertigo   . Diabetes mellitus     diet controlled  . Angina   . Hypertension   . Atrial fibrillation (HCC)   . Atrial flutter (HCC) 2007    h/o  . Stroke [facility]) ~08/17/11    "lost vision left eye"  . Blood dyscrasia   . Coronary artery disease 08/19/11  . Peripheral vascular disease (HCC)      Allergies  Allergen Reactions  . Penicillins Rash     Current Outpatient Prescriptions  Medication Sig Dispense Refill  . aspirin 81 MG tablet Take 81 mg by mouth daily.    Marland Kitchen atorvastatin (LIPITOR) 40 MG tablet TAKE ONE TABLET BY MOUTH ONCE DAILY **STOP  ZOCOR** 30 tablet 6  . cholecalciferol (VITAMIN D) 1000 UNITS tablet Take 2,000 Units by mouth daily.     .  furosemide (LASIX) 20 MG tablet TAKE ONE TABLET BY MOUTH ONCE DAILY 30 tablet 11  . HYDROcodone-acetaminophen (NORCO/VICODIN) 5-325 MG tablet Take 1 tablet by mouth every 4 (four) hours as needed. 10 tablet 0  . lisinopril (PRINIVIL,ZESTRIL) 2.5 MG tablet TAKE ONE TABLET BY MOUTH ONCE DAILY 30 tablet 6  . Menthol-Methyl Salicylate (MUSCLE RUB) 10-15 % CREA Apply 1 application topically as needed for muscle pain.    . metoprolol succinate (TOPROL-XL) 100 MG 24 hr tablet Take 1 tablet (100 mg total) by mouth 2 (two) times daily. Take with or immediately following a meal. 180 tablet 3  . Omega-3 Fatty Acids (FISH OIL) 1000 MG CAPS Take 1,000 mg by mouth daily.    Marland Kitchen warfarin (COUMADIN) 10 MG tablet Take 1 tablet daily except 1/2 tablet on Sundays and Wednesdays 30 tablet 3   No current facility-administered medications for this visit.     Past Surgical History  Procedure Laterality Date  . Achilles tendon repair  06/2002    Left  . Cardioversion  2007  . Cerebral angiogram N/A 08/19/2011    Procedure: CEREBRAL ANGIOGRAM;  Surgeon: Chuck Hint, MD;  Location: [facility] CATH LAB;  Service: Cardiovascular;  Laterality: N/A;  .  Carotid angiogram  08/19/2011    Procedure: CAROTID ANGIOGRAM;  Surgeon: Chuck Hint, MD;  Location: [facility] CATH LAB;  Service: Cardiovascular;;     Allergies  Allergen Reactions  . Penicillins Rash      Family History  Problem Relation Age of Onset  . Diabetes Mother   . Hyperlipidemia Mother   . Hypertension Mother   . Emphysema Father   . Heart failure Father   . Heart attack Brother   . Aneurysm Sister   . Deep vein thrombosis Sister   . Diabetes Sister   . Heart attack Sister      Social History [name] reports that he quit smoking about 4 years ago. His smoking use included Cigars. He started smoking about 19 years ago. He has never used smokeless tobacco. [name] reports that he drinks about 1.8 oz of alcohol per week.   Review of  Systems CONSTITUTIONAL: No weight loss, fever, chills, weakness or fatigue.  HEENT: Eyes: No visual loss, blurred vision, double vision or yellow sclerae.No hearing loss, sneezing, congestion, runny nose or sore throat.  SKIN: No rash or itching.  CARDIOVASCULAR: per hpi RESPIRATORY: No shortness of breath, cough or sputum.  GASTROINTESTINAL: No anorexia, nausea, vomiting or diarrhea. No abdominal pain or blood.  GENITOURINARY: No burning on urination, no polyuria NEUROLOGICAL: No headache, dizziness, syncope, paralysis, ataxia, numbness or tingling in the extremities. No change in bowel or bladder control.  MUSCULOSKELETAL: No muscle, back pain, joint pain or stiffness.  LYMPHATICS: No enlarged nodes. No history of splenectomy.  PSYCHIATRIC: No history of depression or anxiety.  ENDOCRINOLOGIC: No reports of sweating, cold or heat intolerance. No polyuria or polydipsia.  Marland Kitchen   Physical Examination Filed Vitals:   11/04/15 1537  BP: 112/66  Pulse: 81   Filed Vitals:   11/04/15 1537  Height: 6\' 1"  (1.854 m)  Weight: 268 lb (121.564 kg)    Gen: resting comfortably, no acute distress HEENT: no scleral icterus, pupils equal round and reactive, no palptable cervical adenopathy,  CV: irreg, no m/r/g, no jvd Resp: Clear to auscultation bilaterally GI: abdomen is soft, non-tender, non-distended, normal bowel sounds, no hepatosplenomegaly MSK: extremities are warm, no edema.  Skin: warm, no rash Neuro:  no focal deficits Psych: appropriate affect   Diagnostic Studies  03/2013 Echo  Study Conclusions  - Left ventricle: Technically difficult study. There was mild concentric hypertrophy. Systolic function was mildly to moderately reduced. The estimated ejection fraction was in the range of 40% to 45%. Diffuse hypokinesis. The study is not technically sufficient to allow evaluation of LV diastolic function. - Mitral valve: Mildly thickened leaflets . - Left atrium: Moderately  dilated. - Right atrium: The atrium was mildly dilated.  02/2014 Lexiscan MPI  Analysis of the raw data did not demonstrate any significant  extracardiac radiotracer uptake. Analysis of the perfusion images  demonstrated a moderate size, moderately intense, basilar inferior  wall defect. Images were worse on rest than stress. Inferior soft  tissue attenuation also noted. Left ventricular systolic function  was moderately reduced, calculated LV EF 37%. The aforementioned  wall is hypokinetic.  IMPRESSION:  1. Abnormal exercise Cardiolite stress test.  2. Rapid atrial fibrillation noted with exercise.  3. Fixed basal inferior wall defect. While this may represent  myocardial scar, interfering soft tissue attenuation cannot entirely  be excluded.  4. Moderately reduced LV systolic function, calculated LVEF 37%.  07/2015 echo Study Conclusions  - Left ventricle: The cavity size was normal.  Wall thickness was normal. Systolic function was mildly reduced. The estimated ejection fraction was in the range of 45% to 50%. Wall motion was normal; there were no regional wall motion abnormalities. - Aortic valve: Valve area (VTI): 2.39 cm^2. Valve area (Vmax): 2.29 cm^2. - Mitral valve: There was mild regurgitation. - Left atrium: The atrium was severely dilated. - Right atrium: The atrium was severely dilated. - Atrial septum: No defect or patent foramen ovale was identified. - Technically adequate study.    Assessment and Plan  1. Chronic systolic heart failure  - appears euvolemic, no current symptoms - continue current meds  2. Afib  - no significant symptoms, we will continue current meds  3. Carotid stenosis  - will order carotid US - asked him again to schedule his f/u with vascular, phone number provided to him.   4. Hyperlipidemia  - continue statin, lipids are controlled.   5. OSA screen - several signs and symptoms of OSA.  -reminded again  today to call and schedule his evaluation with Dr Juanetta Gosling      Antoine Poche, M.D.

## 2015-11-04 NOTE — Patient Instructions (Signed)
Your physician wants you to follow-up in: 6 Months with Dr. Wyline Mood. You will receive a reminder letter in the mail two months in advance. If you don't receive a letter, please call our office to schedule the follow-up appointment.  Your physician recommends that you continue on your current medications as directed. Please refer to the Current Medication list given to you today.  Your physician has requested that you have a carotid duplex. This test is an ultrasound of the carotid arteries in your neck. It looks at blood flow through these arteries that supply the brain with blood. Allow one hour for this exam. There are no restrictions or special instructions.  You have been referred to Dr. Juanetta Gosling  If you need a refill on your cardiac medications before your next appointment, please call your pharmacy.  Thank you for choosing [facility]!

## 2015-11-13 ENCOUNTER — Ambulatory Visit (HOSPITAL_COMMUNITY)
Admission: RE | Admit: 2015-11-13 | Discharge: 2015-11-13 | Disposition: A | Discharge status: Home / Self Care | Payer: Medicare HMO | Source: Ambulatory Visit | Attending: Cardiology | Admitting: Cardiology

## 2015-11-13 DIAGNOSIS — I6523 Occlusion and stenosis of bilateral carotid arteries: Secondary | ICD-10-CM | POA: Insufficient documentation

## 2015-11-16 ENCOUNTER — Telehealth: Payer: Self-pay

## 2015-11-16 DIAGNOSIS — I6523 Occlusion and stenosis of bilateral carotid arteries: Secondary | ICD-10-CM

## 2015-11-16 IMAGING — NM NM MYOCAR SINGLE W/SPECT W/WALL MOTION & EF
3 series · 18 of 18 positions shown · non-contrast
Comparison: None.

CLINICAL DATA: The patient has a history of atrial fibrillation and
chronic systolic heart failure with peripheral vascular disease, and
is referred for an ischemic evaluation.

EXAM:
MYOCARDIAL IMAGING WITH SPECT (REST AND EXERCISE)
GATED LEFT VENTRICULAR WALL MOTION STUDY
LEFT VENTRICULAR EJECTION FRACTION
TECHNIQUE: Standard myocardial SPECT imaging was performed after resting
intravenous injection of 10 mCi Zc-55m sestamibi. Subsequently,
exercise tolerance test was performed by the patient under the
supervision of the Cardiology staff. At peak-stress, 30 mCi Zc-55m
sestamibi was injected intravenously and standard myocardial SPECT
imaging was performed. Quantitative gated imaging was also performed
to evaluate left ventricular wall motion, and estimate left
ventricular ejection fraction.

[Series 1: rest · 8.28mm/px · 6 of 64 frames shown]
[frame 6/64]
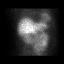
[frame 16/64]
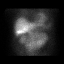
[frame 27/64]
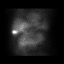
[frame 38/64]
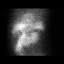
[frame 48/64]
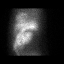
[frame 59/64]
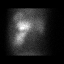

[Series 2: stress gated · 8.28mm/px · 6 of 64 frames shown (1 of 2)]
[frame 6/64]
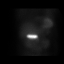
[frame 16/64]
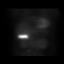
[frame 27/64]
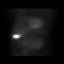
[frame 38/64]
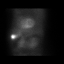
[frame 48/64]
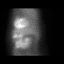
[frame 59/64]
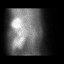

[Series 2: stress gated · 8.28mm/px · 6 of 512 frames shown (2 of 2)]
[frame 43/512]
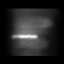
[frame 128/512]
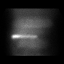
[frame 214/512]
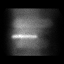
[frame 299/512]
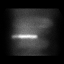
[frame 384/512]
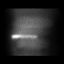
[frame 470/512]
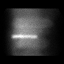

[18 of 18 positions shown; findings below may reference images not displayed]

FINDINGS: The patient was stressed according to the Bruce protocol for 5 min
18 seconds achieving a work level of 7 Mets. The resting heart rate
of 90 beats per min rose to a maximal heart rate of 203 beats per
min. This value represents 125% of the maximal, age predicted heart
rate. The resting blood pressure of 105/82 rose to a maximum blood
pressure of 196/88. The stress test was stopped due to rapid atrial
fibrillation.Resting ECG demonstrated atrial fibrillation, heart
rate 88 beats per min. With stress, rapid atrial fibrillation was
noted with a diffuse nonspecific ST segment abnormality few isolated
PVCs. No chest pain was reported.

Analysis of the raw data did not demonstrate any significant
extracardiac radiotracer uptake. Analysis of the perfusion images
demonstrated a moderate size, moderately intense, basilar inferior
wall defect. Images were worse on rest than stress. Inferior soft
tissue attenuation also noted. Left ventricular systolic function
was moderately reduced, calculated LV EF 37%. The aforementioned
wall is hypokinetic.
IMPRESSION: 1.  Abnormal exercise Cardiolite stress test.

2.  Rapid atrial fibrillation noted with exercise.

3. Fixed basal inferior wall defect. While this may represent
myocardial scar, interfering soft tissue attenuation cannot entirely
be excluded.

4.  Moderately reduced LV systolic function, calculated LVEF 37%.

## 2015-11-16 NOTE — Telephone Encounter (Signed)
Referral placed to [facility].

## 2015-11-16 NOTE — Telephone Encounter (Signed)
-----   Message from Jonathan F Branch, MD sent at 11/16/2015  2:08 PM EDT ----- Carotid US shows old blockaged on left side, right side blockage is moderate. Has he scheduled f/u with vascular yet?  J Branch MD 

## 2015-11-16 NOTE — Telephone Encounter (Signed)
-----   Message from Antoine PocheJonathan F Branch, MD sent at 11/16/2015  2:08 PM EDT ----- Carotid US shows old blockaged on left side, right side blockage is moderate. Has he scheduled f/u with vascular yet?  Dominga FerryJ Branch MD

## 2015-12-01 ENCOUNTER — Encounter: Payer: Self-pay | Admitting: Vascular Surgery

## 2015-12-02 ENCOUNTER — Encounter: Payer: Self-pay | Admitting: *Deleted

## 2015-12-09 ENCOUNTER — Encounter: Payer: Medicare HMO | Admitting: Vascular Surgery

## 2015-12-16 ENCOUNTER — Encounter: Payer: Self-pay | Admitting: Vascular Surgery

## 2015-12-24 ENCOUNTER — Encounter: Payer: Medicare HMO | Admitting: Vascular Surgery

## 2016-01-01 ENCOUNTER — Encounter: Payer: Self-pay | Admitting: Surgery

## 2016-01-11 ENCOUNTER — Encounter: Payer: Medicare HMO | Admitting: Surgery

## 2016-01-19 ENCOUNTER — Encounter: Payer: Self-pay | Admitting: Surgery

## 2016-01-25 ENCOUNTER — Encounter: Payer: Medicare HMO | Admitting: Surgery

## 2016-01-26 ENCOUNTER — Other Ambulatory Visit: Payer: Self-pay | Admitting: Cardiology

## 2016-01-26 ENCOUNTER — Encounter: Payer: Self-pay | Admitting: Vascular Surgery

## 2016-01-27 ENCOUNTER — Encounter: Payer: Self-pay | Admitting: Vascular Surgery

## 2016-01-28 ENCOUNTER — Ambulatory Visit (INDEPENDENT_AMBULATORY_CARE_PROVIDER_SITE_OTHER): Payer: Medicare HMO | Admitting: Vascular Surgery

## 2016-01-28 ENCOUNTER — Encounter: Payer: Self-pay | Admitting: Vascular Surgery

## 2016-01-28 VITALS — BP 140/84 | HR 82 | Ht 73.0 in | Wt 273.0 lb

## 2016-01-28 DIAGNOSIS — I6521 Occlusion and stenosis of right carotid artery: Secondary | ICD-10-CM | POA: Diagnosis not present

## 2016-01-28 NOTE — Progress Notes (Signed)
History of Present Illness:  Patient is a 60 y.o. year old male who presents for evaluation of carotid stenosis.  he was last seen for this 5 years ago. He was noted at that time to have a moderate right carotid stenosis and occluded left internal carotid artery. The patient denies symptoms of TIA, amaurosis, or stroke.  The patient is currently warfarin for atrial fibrillation .  The left side occlusion was confirmed by arteriogram by my partner Dr Edilia Bo in Dec 2012.  Other medical problems include hyperlipidemia, diabetes, hypertension, atrial fibrillation, coronary disease.  These are currently stable and followed by his primary care physician.      Past Medical History   Diagnosis  Date   .  Hyperlipidemia     .  Gilbert's syndrome     .  Vertigo     .  Diabetes mellitus         diet controlled   .  Angina     .  Hypertension     .  Atrial fibrillation     .  Atrial flutter  2007       h/o   .  Stroke  ~08/17/11       "lost vision left eye"   .  Blood dyscrasia     .  Coronary artery disease  08/19/11   .  Peripheral vascular disease         Past Surgical History   Procedure  Date   .  Achilles tendon repair  06/2002       Left   .  Cardioversion  2007      Social History History   Substance Use Topics   .  Smoking status:  Former Smoker -- 0.0 packs/day for 10 years       Types:  Cigars       Quit date:  08/16/2011   .  Smokeless tobacco:  Never Used   .  Alcohol Use:  1.8 oz/week       3 Cans of beer per week     Family History Family History   Problem  Relation  Age of Onset   .  Diabetes  Mother     .  Hyperlipidemia  Mother     .  Hypertension  Mother     .  Emphysema  Father     .  Heart failure  Father     .  Heart attack  Brother     .  Aneurysm  Sister     .  Deep vein thrombosis  Sister     .  Diabetes  Sister     .  Heart attack  Sister       Allergies    Allergies   Allergen  Reactions   .  Other         Mayonnaise   .  Penicillins   Rash    Current Outpatient Prescriptions on File Prior to Visit  Medication Sig Dispense Refill  . aspirin 81 MG tablet Take 81 mg by mouth daily.    Marland Kitchen atorvastatin (LIPITOR) 40 MG tablet TAKE ONE TABLET BY MOUTH ONCE DAILY **STOP  ZOCOR** 30 tablet 6  . cholecalciferol (VITAMIN D) 1000 UNITS tablet Take 2,000 Units by mouth daily.     . furosemide (LASIX) 20 MG tablet TAKE ONE TABLET BY MOUTH ONCE DAILY 30 tablet 11  . lisinopril (PRINIVIL,ZESTRIL) 2.5 MG tablet TAKE ONE TABLET BY MOUTH ONCE DAILY  30 tablet 6  . metoprolol succinate (TOPROL-XL) 100 MG 24 hr tablet Take 1 tablet (100 mg total) by mouth 2 (two) times daily. Take with or immediately following a meal. 180 tablet 3  . Omega-3 Fatty Acids (FISH OIL) 1000 MG CAPS Take 1,000 mg by mouth daily.    Marland Kitchen. warfarin (COUMADIN) 10 MG tablet TAKE ONE TABLET BY MOUTH ONCE DAILY EXCEPT ONE-HALF TABLET ON SUNDAYS AND WEDNESDAYS 15 tablet 0   No current facility-administered medications on file prior to visit.    ROS:    General:  No weight loss, Fever, chills  HEENT: No recent headaches, no nasal bleeding, no visual changes, no sore throat  Neurologic: No dizziness, blackouts, seizures. No recent symptoms of stroke or mini- stroke. No recent episodes of slurred speech, or temporary blindness.  Cardiac: No recent episodes of chest pain/pressure, no shortness of breath at rest.  No shortness of breath with exertion.  Denies history of atrial fibrillation or irregular heartbeat  Vascular: No history of rest pain in feet.  No history of claudication.  No history of non-healing ulcer, No history of DVT    Pulmonary: No home oxygen, no productive cough, no hemoptysis,  No asthma or wheezing  Musculoskeletal:  [ ]  Arthritis, [ ]  Low back pain,  [ ]  Joint pain  Hematologic:No history of hypercoagulable state.  No history of easy bleeding.  No history of anemia  Gastrointestinal: No hematochezia or melena,  No gastroesophageal reflux, no trouble  swallowing  Urinary: [ ]  chronic Kidney disease, [ ]  on HD - [ ]  MWF or [ ]  TTHS, [ ]  Burning with urination, [ ]  Frequent urination, [ ]  Difficulty urinating;    Skin: No rashes  Psychological: No history of anxiety,  No history of depression   Physical Examination    Filed Vitals:   01/28/16 1119 01/28/16 1122  BP: 138/97 140/84  Pulse: 82   Height: 6\' 1"  (1.854 m)   Weight: 273 lb (123.832 kg)   SpO2: 98%    General:  Alert and oriented, no acute distress HEENT: Normal Neck: No bruit or JVD Pulmonary: Clear to auscultation bilaterally Cardiac: Regular Rate and Rhythm without murmur Gastrointestinal: Soft, non-tender, non-distended, no mass, obese Skin: No rash Extremity Pulses:  2+ radial, brachial  pulses bilaterally Musculoskeletal: No deformity or edema     Neurologic: Upper and lower extremity motor 5/5 and symmetric, blind left eye  DATA: I reviewed his carotid duplex exam In about 3 weeks ago. This showed chronic left internal carotid artery occlusion and a moderate 50-70% right internal carotid artery stenosis.   ASSESSMENT: Chronic left internal carotid artery occlusion 50-70% right internal carotid artery stenosis currently asymptomatic   PLAN:  Repeat right internal carotid artery duplex scan in 6 months time. No intervention necessary on the left side. The patient is on warfarin for atrial fibrillation and this anticoagulation should be adequate for his carotid stenosis as well. He will return sooner if he develops any symptoms of TIA amaurosis or stroke.   Fabienne Brunsharles Erwin Nishiyama, MD [facility] [place] Office: [phone] Pager: [phone]

## 2016-02-12 ENCOUNTER — Other Ambulatory Visit: Payer: Self-pay | Admitting: Cardiology

## 2016-02-22 ENCOUNTER — Other Ambulatory Visit: Payer: Self-pay | Admitting: *Deleted

## 2016-02-22 MED ORDER — WARFARIN SODIUM 10 MG PO TABS: ORAL_TABLET | ORAL | Status: DC

## 2016-03-09 ENCOUNTER — Ambulatory Visit (INDEPENDENT_AMBULATORY_CARE_PROVIDER_SITE_OTHER): Payer: Medicare HMO | Admitting: *Deleted

## 2016-03-09 DIAGNOSIS — Z5181 Encounter for therapeutic drug level monitoring: Secondary | ICD-10-CM

## 2016-03-09 DIAGNOSIS — I4891 Unspecified atrial fibrillation: Secondary | ICD-10-CM

## 2016-03-09 LAB — POCT INR: INR: 1.1

## 2016-03-09 MED ORDER — WARFARIN SODIUM 10 MG PO TABS: ORAL_TABLET | ORAL | Status: DC

## 2016-03-21 ENCOUNTER — Encounter: Payer: Self-pay | Admitting: *Deleted

## 2016-04-01 NOTE — Addendum Note (Signed)
Addended by: Dannielle Karvonen on: 04/01/2016 01:58 PM   Modules accepted: Orders

## 2016-04-18 ENCOUNTER — Ambulatory Visit (INDEPENDENT_AMBULATORY_CARE_PROVIDER_SITE_OTHER): Payer: Medicare HMO | Admitting: *Deleted

## 2016-04-18 DIAGNOSIS — Z5181 Encounter for therapeutic drug level monitoring: Secondary | ICD-10-CM | POA: Diagnosis not present

## 2016-04-18 DIAGNOSIS — I4891 Unspecified atrial fibrillation: Secondary | ICD-10-CM

## 2016-04-18 LAB — POCT INR: INR: 1.4

## 2016-05-02 ENCOUNTER — Encounter: Payer: Self-pay | Admitting: *Deleted

## 2016-05-18 ENCOUNTER — Other Ambulatory Visit: Payer: Self-pay | Admitting: Cardiology

## 2016-05-26 NOTE — Progress Notes (Signed)
This encounter was created in error - please disregard.

## 2016-06-06 ENCOUNTER — Ambulatory Visit (INDEPENDENT_AMBULATORY_CARE_PROVIDER_SITE_OTHER): Payer: Medicare HMO | Admitting: *Deleted

## 2016-06-06 DIAGNOSIS — Z5181 Encounter for therapeutic drug level monitoring: Secondary | ICD-10-CM

## 2016-06-06 DIAGNOSIS — I4891 Unspecified atrial fibrillation: Secondary | ICD-10-CM

## 2016-06-06 LAB — POCT INR: INR: 1.1

## 2016-06-06 MED ORDER — WARFARIN SODIUM 10 MG PO TABS: ORAL_TABLET | ORAL | 2 refills | Status: DC

## 2016-06-08 ENCOUNTER — Ambulatory Visit: Payer: Medicare HMO | Admitting: Cardiology

## 2016-06-10 DIAGNOSIS — I110 Hypertensive heart disease with heart failure: Secondary | ICD-10-CM | POA: Diagnosis not present

## 2016-06-10 DIAGNOSIS — I4891 Unspecified atrial fibrillation: Secondary | ICD-10-CM | POA: Diagnosis not present

## 2016-06-10 DIAGNOSIS — Z0000 Encounter for general adult medical examination without abnormal findings: Secondary | ICD-10-CM | POA: Diagnosis not present

## 2016-06-10 DIAGNOSIS — E7800 Pure hypercholesterolemia, unspecified: Secondary | ICD-10-CM | POA: Diagnosis not present

## 2016-06-10 DIAGNOSIS — I5022 Chronic systolic (congestive) heart failure: Secondary | ICD-10-CM | POA: Diagnosis not present

## 2016-06-20 ENCOUNTER — Encounter: Payer: Self-pay | Admitting: *Deleted

## 2016-06-27 ENCOUNTER — Ambulatory Visit (INDEPENDENT_AMBULATORY_CARE_PROVIDER_SITE_OTHER): Payer: Medicare HMO | Admitting: *Deleted

## 2016-06-27 DIAGNOSIS — I4891 Unspecified atrial fibrillation: Secondary | ICD-10-CM

## 2016-06-27 DIAGNOSIS — Z5181 Encounter for therapeutic drug level monitoring: Secondary | ICD-10-CM | POA: Diagnosis not present

## 2016-06-27 LAB — POCT INR: INR: 1.9

## 2016-06-28 ENCOUNTER — Encounter: Payer: Self-pay | Admitting: *Deleted

## 2016-06-29 ENCOUNTER — Ambulatory Visit (INDEPENDENT_AMBULATORY_CARE_PROVIDER_SITE_OTHER): Payer: Medicare HMO | Admitting: Cardiology

## 2016-06-29 ENCOUNTER — Encounter: Payer: Self-pay | Admitting: Cardiology

## 2016-06-29 VITALS — BP 111/70 | HR 80 | Ht 73.0 in | Wt 271.0 lb

## 2016-06-29 DIAGNOSIS — Z23 Encounter for immunization: Secondary | ICD-10-CM | POA: Diagnosis not present

## 2016-06-29 DIAGNOSIS — G4730 Sleep apnea, unspecified: Secondary | ICD-10-CM | POA: Diagnosis not present

## 2016-06-29 DIAGNOSIS — I4891 Unspecified atrial fibrillation: Secondary | ICD-10-CM | POA: Diagnosis not present

## 2016-06-29 DIAGNOSIS — E785 Hyperlipidemia, unspecified: Secondary | ICD-10-CM | POA: Diagnosis not present

## 2016-06-29 DIAGNOSIS — E118 Type 2 diabetes mellitus with unspecified complications: Secondary | ICD-10-CM | POA: Diagnosis not present

## 2016-06-29 NOTE — Progress Notes (Addendum)
Clinical Summary [name] is a 60 y.o.male seen today for follow up of the following medical problems.   1. Chronic systolic heart failure  - 03/2013 LVEF 40-45%.  - repeat echo 07/2015 LVEF 45-50%, no WMA  - 02/25/14 Lexiscan MPI with fixed basal inferior defect, scar vs attenuation. LVEF 37%, basal inferior hypokinesis.    - denies any SOB or DOE since last visit. Compliant with meds. No recent LE edema   2. Permanent afib  - He has not been interested in NOACs due to cost.  - no recent palpitations. No bleeding troubles on coumadin. He does have history of prior TIA.   3. Carotid stenosis  - followed by vascular, history of LICA chronic occlusion    4. Hyperlipidemia  - 04/2015 TC 135 TG 63 HDL 40 LDL 82 - compliant with statin   5. OSA screen  - + snore, + obesity. Denies somnolence. + afib, + chf  - he has not called to schedule his appt with Dr Juanetta Gosling for OSA evaluation since last visit    SH: son who is a Heritage manager, currently working at [employer]. He is interested in eventually getting his masters and pursuing a Medical illustrator.    Past Medical History:  Diagnosis Date  . Angina   . Atrial fibrillation (HCC)   . Atrial flutter (HCC) 2007   h/o  . Blood dyscrasia   . Coronary artery disease 08/19/11  . Diabetes mellitus    diet controlled  . Gilbert's syndrome   . Hyperlipidemia   . Hypertension   . Peripheral vascular disease (HCC)   . Stroke [facility]) ~08/17/11   "lost vision left eye"  . Vertigo      Allergies  Allergen Reactions  . Penicillins Rash     Current Outpatient Prescriptions  Medication Sig Dispense Refill  . Cholecalciferol (VITAMIN D3) 400 units CAPS Take 1 capsule by mouth daily.    Marland Kitchen aspirin 81 MG tablet Take 81 mg by mouth daily.    Marland Kitchen atorvastatin (LIPITOR) 40 MG tablet TAKE ONE TABLET BY MOUTH ONCE DAILY **STOP  ZOCOR** 30 tablet 6  . furosemide (LASIX) 20 MG tablet TAKE ONE TABLET BY MOUTH ONCE DAILY 30 tablet  6  . lisinopril (PRINIVIL,ZESTRIL) 2.5 MG tablet TAKE ONE TABLET BY MOUTH ONCE DAILY 30 tablet 6  . metoprolol succinate (TOPROL-XL) 100 MG 24 hr tablet Take 1 tablet (100 mg total) by mouth 2 (two) times daily. Take with or immediately following a meal. 180 tablet 3  . Omega-3 Fatty Acids (FISH OIL) 1000 MG CAPS Take 1,000 mg by mouth daily.    Marland Kitchen warfarin (COUMADIN) 10 MG tablet Take 1 tablet daily except 1/2 tablet on Sundays and Wednesdays 30 tablet 2   No current facility-administered medications for this visit.      Past Surgical History:  Procedure Laterality Date  . ACHILLES TENDON REPAIR  06/2002   Left  . CARDIOVERSION  2007  . CAROTID ANGIOGRAM  08/19/2011   Procedure: CAROTID ANGIOGRAM;  Surgeon: Chuck Hint, MD;  Location: [facility] C[facility] LAB;  Service: Cardiovascular;;  . CEREBRAL ANGIOGRAM N/A 08/19/2011   Procedure: CEREBRAL ANGIOGRAM;  Surgeon: Chuck Hint, MD;  Location: [facility] CATH LAB;  Service: Cardiovascular;  Laterality: N/A;     Allergies  Allergen Reactions  . Penicillins Rash      Family History  Problem Relation Age of Onset  . Diabetes Mother   . Hyperlipidemia Mother   .  Hypertension Mother   . Emphysema Father   . Heart failure Father   . Heart attack Brother   . Aneurysm Sister   . Deep vein thrombosis Sister   . Diabetes Sister   . Heart attack Sister      Social History Mr. Goudeau reports that he quit smoking about 4 years ago. His smoking use included Cigars. He started smoking about 20 years ago. He has a 0.10 pack-year smoking history. He has never used smokeless tobacco. Mr. Dirocco reports that he drinks about 1.8 oz of alcohol per week .   Review of Systems CONSTITUTIONAL: No weight loss, fever, chills, weakness or fatigue.  HEENT: Eyes: No visual loss, blurred vision, double vision or yellow sclerae.No hearing loss, sneezing, congestion, runny nose or sore throat.  SKIN: No rash or itching.  CARDIOVASCULAR: per  HPI RESPIRATORY: No shortness of breath, cough or sputum.  GASTROINTESTINAL: No anorexia, nausea, vomiting or diarrhea. No abdominal pain or blood.  GENITOURINARY: No burning on urination, no polyuria NEUROLOGICAL: No headache, dizziness, syncope, paralysis, ataxia, numbness or tingling in the extremities. No change in bowel or bladder control.  MUSCULOSKELETAL: No muscle, back pain, joint pain or stiffness.  LYMPHATICS: No enlarged nodes. No history of splenectomy.  PSYCHIATRIC: No history of depression or anxiety.  ENDOCRINOLOGIC: No reports of sweating, cold or heat intolerance. No polyuria or polydipsia.  Marland Kitchen   Physical Examination There were no vitals filed for this visit. Filed Weights   06/29/16 0759  Weight: 271 lb (122.9 kg)    Gen: resting comfortably, no acute distress HEENT: no scleral icterus, pupils equal round and reactive, no palptable cervical adenopathy,  CV: RRR, no m/r/g, no jvd Resp: Clear to auscultation bilaterally GI: abdomen is soft, non-tender, non-distended, normal bowel sounds, no hepatosplenomegaly MSK: extremities are warm, no edema.  Skin: warm, no rash Neuro:  no focal deficits Psych: appropriate affect   Diagnostic Studies 03/2013 Echo  Study Conclusions  - Left ventricle: Technically difficult study. There was mild concentric hypertrophy. Systolic function was mildly to moderately reduced. The estimated ejection fraction was in the range of 40% to 45%. Diffuse hypokinesis. The study is not technically sufficient to allow evaluation of LV diastolic function. - Mitral valve: Mildly thickened leaflets . - Left atrium: Moderately dilated. - Right atrium: The atrium was mildly dilated.  02/2014 Lexiscan MPI  Analysis of the raw data did not demonstrate any significant  extracardiac radiotracer uptake. Analysis of the perfusion images  demonstrated a moderate size, moderately intense, basilar inferior  wall defect. Images were worse on rest  than stress. Inferior soft  tissue attenuation also noted. Left ventricular systolic function  was moderately reduced, calculated LV EF 37%. The aforementioned  wall is hypokinetic.  IMPRESSION:  1. Abnormal exercise Cardiolite stress test.  2. Rapid atrial fibrillation noted with exercise.  3. Fixed basal inferior wall defect. While this may represent  myocardial scar, interfering soft tissue attenuation cannot entirely  be excluded.  4. Moderately reduced LV systolic function, calculated LVEF 37%.  07/2015 echo Study Conclusions  - Left ventricle: The cavity size was normal. Wall thickness was normal. Systolic function was mildly reduced. The estimated ejection fraction was in the range of 45% to 50%. Wall motion was normal; there were no regional wall motion abnormalities. - Aortic valve: Valve area (VTI): 2.39 cm^2. Valve area (Vmax): 2.29 cm^2. - Mitral valve: There was mild regurgitation. - Left atrium: The atrium was severely dilated. - Right atrium: The atrium was  severely dilated. - Atrial septum: No defect or patent foramen ovale was identified. - Technically adequate study.   11/2015 Carotid US IMPRESSION: Complete occlusion of the left internal carotid artery near its origin.  Moderate eccentric calcified plaque is noted in the right carotid bulb and proximal right internal carotid artery consistent with 50-69% stenosis based on ultrasound and Doppler criteria.   Assessment and Plan  1. Chronic systolic heart failure  -no current symptoms - continue current meds  2. Afib  - no significant symptoms, we will continue current meds - CHADS2Vasc score is 6, including prior TIA. He reports upcoming dental procedure, would recommend lovenox bridge while coumadin on hold. He is to call us once surgery is scheduled.  - ekg in clinic shows afib rate controlled  3. Carotid stenosis  - continue to follow with vascular.   4. Hyperlipidemia   - continue statin, we will repeat labs  5. OSA screen - several signs and symptoms of OSA.  -encouraged again today to call and schedule his evaluation with Dr Juanetta GoslingHawkins   F/u 6 months   Antoine PocheJonathan F. Shontell Prosser, M.D.

## 2016-06-29 NOTE — Patient Instructions (Signed)
Your physician wants you to follow-up in: 6 MONTHS WITH DR. BRANCH You will receive a reminder letter in the mail two months in advance. If you don't receive a letter, please call our office to schedule the follow-up appointment.  Your physician recommends that you continue on your current medications as directed. Please refer to the Current Medication list given to you today.  Your physician recommends that you return for lab work CBC/MG/TSH/LIPIDS/HGBA1C/BMP - PLEASE FAST PRIOR TO LAB WORK   You have been referred to DR HAWKINS SLEEP APNEA   Thank you for choosing [facility]!!

## 2016-07-06 ENCOUNTER — Other Ambulatory Visit: Payer: Self-pay | Admitting: Cardiology

## 2016-07-25 ENCOUNTER — Encounter: Payer: Self-pay | Admitting: *Deleted

## 2016-07-27 ENCOUNTER — Encounter: Payer: Self-pay | Admitting: Family

## 2016-08-04 ENCOUNTER — Ambulatory Visit: Payer: Medicare HMO | Admitting: Family

## 2016-08-04 ENCOUNTER — Encounter (HOSPITAL_COMMUNITY): Payer: Medicare HMO

## 2016-08-09 ENCOUNTER — Other Ambulatory Visit: Payer: Self-pay | Admitting: Cardiology

## 2016-09-12 ENCOUNTER — Encounter: Payer: Self-pay | Admitting: *Deleted

## 2016-09-17 ENCOUNTER — Other Ambulatory Visit: Payer: Self-pay | Admitting: Cardiology

## 2016-10-10 ENCOUNTER — Ambulatory Visit (INDEPENDENT_AMBULATORY_CARE_PROVIDER_SITE_OTHER): Payer: Medicare HMO | Admitting: *Deleted

## 2016-10-10 DIAGNOSIS — Z5181 Encounter for therapeutic drug level monitoring: Secondary | ICD-10-CM | POA: Diagnosis not present

## 2016-10-10 DIAGNOSIS — I4891 Unspecified atrial fibrillation: Secondary | ICD-10-CM | POA: Diagnosis not present

## 2016-10-10 LAB — POCT INR: INR: 1.2

## 2016-10-10 MED ORDER — WARFARIN SODIUM 10 MG PO TABS: ORAL_TABLET | ORAL | 1 refills | Status: DC

## 2016-10-24 ENCOUNTER — Ambulatory Visit (INDEPENDENT_AMBULATORY_CARE_PROVIDER_SITE_OTHER): Payer: Medicare HMO | Admitting: *Deleted

## 2016-10-24 DIAGNOSIS — Z5181 Encounter for therapeutic drug level monitoring: Secondary | ICD-10-CM

## 2016-10-24 DIAGNOSIS — I4891 Unspecified atrial fibrillation: Secondary | ICD-10-CM | POA: Diagnosis not present

## 2016-10-24 LAB — POCT INR: INR: 2.5

## 2016-11-07 ENCOUNTER — Ambulatory Visit (INDEPENDENT_AMBULATORY_CARE_PROVIDER_SITE_OTHER): Payer: Medicare HMO | Admitting: *Deleted

## 2016-11-07 DIAGNOSIS — Z5181 Encounter for therapeutic drug level monitoring: Secondary | ICD-10-CM | POA: Diagnosis not present

## 2016-11-07 DIAGNOSIS — I4891 Unspecified atrial fibrillation: Secondary | ICD-10-CM | POA: Diagnosis not present

## 2016-11-07 LAB — POCT INR: INR: 2.8

## 2016-11-07 MED ORDER — APIXABAN 5 MG PO TABS: 5.0000 mg | ORAL_TABLET | Freq: Two times a day (BID) | ORAL | 6 refills | Status: DC

## 2016-11-29 NOTE — Addendum Note (Signed)
Addended by: Burton ApleyPETTY, Jahnavi Muratore A on: 11/29/2016 09:42 AM   Modules accepted: Orders

## 2016-12-05 ENCOUNTER — Other Ambulatory Visit: Payer: Self-pay | Admitting: Cardiology

## 2016-12-07 ENCOUNTER — Other Ambulatory Visit: Payer: Self-pay

## 2016-12-07 MED ORDER — LISINOPRIL 2.5 MG PO TABS: 2.5000 mg | ORAL_TABLET | Freq: Every day | ORAL | 3 refills | Status: DC

## 2016-12-07 MED ORDER — ATORVASTATIN CALCIUM 40 MG PO TABS: ORAL_TABLET | ORAL | 3 refills | Status: DC

## 2016-12-28 ENCOUNTER — Encounter: Payer: Self-pay | Admitting: Cardiology

## 2016-12-28 ENCOUNTER — Ambulatory Visit (INDEPENDENT_AMBULATORY_CARE_PROVIDER_SITE_OTHER): Payer: Medicare HMO | Admitting: Cardiology

## 2016-12-28 VITALS — BP 117/69 | HR 78 | Ht 73.0 in | Wt 271.0 lb

## 2016-12-28 DIAGNOSIS — I5022 Chronic systolic (congestive) heart failure: Secondary | ICD-10-CM | POA: Diagnosis not present

## 2016-12-28 DIAGNOSIS — E785 Hyperlipidemia, unspecified: Secondary | ICD-10-CM | POA: Diagnosis not present

## 2016-12-28 DIAGNOSIS — I4891 Unspecified atrial fibrillation: Secondary | ICD-10-CM | POA: Diagnosis not present

## 2016-12-28 DIAGNOSIS — R739 Hyperglycemia, unspecified: Secondary | ICD-10-CM | POA: Diagnosis not present

## 2016-12-28 NOTE — Progress Notes (Signed)
Clinical Summary Mr. Ureta is a 61 y.o.male seen today for follow up of the following medical problems.   1. Chronic systolic heart failure  - 03/2013 LVEF 40-45%.  - repeat echo 07/2015 LVEF 45-50%, no WMA  - 02/25/14 Lexiscan MPI with fixed basal inferior defect, scar vs attenuation. LVEF 37%, basal inferior hypokinesis.    - no recent SOB/DOE/LE edema - compliant with meds  2. Permanent afib  - He has not been interested in NOACs due to cost.  - isolated episode of palpitations about 2 months ago.    3. Carotid stenosis  - followed by vascular, history of LICA chronic occlusion  - has repeat US pending and appt with vascular next month  4. Hyperlipidemia  - compliant with statin   5. OSA screen  - + snore, + obesity. Denies somnolence. + afib, + chf  - working to schedule OSA test.      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 Angina   . Atrial fibrillation (HCC)   . Atrial flutter (HCC) 2007   h/o  . Blood dyscrasia   . Coronary artery disease 08/19/11  . Diabetes mellitus    diet controlled  . Gilbert's syndrome   . Hyperlipidemia   . Hypertension   . Peripheral vascular disease (HCC)   . Stroke [facility]) ~08/17/11   "lost vision left eye"  . Vertigo      Allergies  Allergen Reactions  . Penicillins Rash     Current Outpatient Prescriptions  Medication Sig Dispense Refill  . apixaban (ELIQUIS) 5 MG TABS tablet Take 1 tablet (5 mg total) by mouth 2 (two) times daily. 60 tablet 6  . aspirin 81 MG tablet Take 81 mg by mouth daily.    Marland Kitchen atorvastatin (LIPITOR) 40 MG tablet TAKE ONE TABLET BY MOUTH ONCE DAILY 90 tablet 3  . Cholecalciferol (VITAMIN D3) 400 units CAPS Take 1 capsule by mouth daily.    . furosemide (LASIX) 20 MG tablet TAKE ONE TABLET BY MOUTH ONCE DAILY 30 tablet 1  . lisinopril (PRINIVIL,ZESTRIL) 2.5 MG tablet Take 1 tablet (2.5 mg total) by mouth daily. 90 tablet 3  . metoprolol  succinate (TOPROL-XL) 100 MG 24 hr tablet TAKE ONE TABLET BY MOUTH TWICE DAILY WITH OR IMMEDIATELY FOLLOWING A MEAL **DOSE INCREASE** 180 tablet 3  . Omega-3 Fatty Acids (FISH OIL) 1200 MG CAPS Take 1 capsule by mouth daily.    Marland Kitchen warfarin (COUMADIN) 10 MG tablet TAKE ONE TABLET BY MOUTH ONCE DAILY EXCEPT ONE-HALF TABLET ON SUNDAYS AND WEDNESDAYS 30 tablet 1   No current facility-administered medications for this visit.      Past Surgical History:  Procedure Laterality Date  . ACHILLES TENDON REPAIR  06/2002   Left  . CARDIOVERSION  2007  . CAROTID ANGIOGRAM  08/19/2011   Procedure: CAROTID ANGIOGRAM;  Surgeon: Chuck Hint, MD;  Location: [facility] CATH LAB;  Service: Cardiovascular;;  . CEREBRAL ANGIOGRAM N/A 08/19/2011   Procedure: CEREBRAL ANGIOGRAM;  Surgeon: Chuck Hint, MD;  Location: [facility] CATH LAB;  Service: Cardiovascular;  Laterality: N/A;     Allergies  Allergen Reactions  . Penicillins Rash      Family History  Problem Relation Age of Onset  . Diabetes Mother   . Hyperlipidemia Mother   . Hypertension Mother   . Emphysema Father   . Heart failure Father   . Heart attack Brother   .  Aneurysm Sister   . Deep vein thrombosis Sister   . Diabetes Sister   . Heart attack Sister      Social History Mr. Rho reports that he quit smoking about 5 years ago. His smoking use included Cigars. He started smoking about 20 years ago. He has a 0.10 pack-year smoking history. He has never used smokeless tobacco. Mr. Lezcano reports that he drinks about 1.8 oz of alcohol per week .   Review of Systems CONSTITUTIONAL: No weight loss, fever, chills, weakness or fatigue.  HEENT: Eyes: No visual loss, blurred vision, double vision or yellow sclerae.No hearing loss, sneezing, congestion, runny nose or sore throat.  SKIN: No rash or itching.  CARDIOVASCULAR: per hpi RESPIRATORY: No shortness of breath, cough or sputum.  GASTROINTESTINAL: No anorexia, nausea, vomiting  or diarrhea. No abdominal pain or blood.  GENITOURINARY: No burning on urination, no polyuria NEUROLOGICAL: No headache, dizziness, syncope, paralysis, ataxia, numbness or tingling in the extremities. No change in bowel or bladder control.  MUSCULOSKELETAL: No muscle, back pain, joint pain or stiffness.  LYMPHATICS: No enlarged nodes. No history of splenectomy.  PSYCHIATRIC: No history of depression or anxiety.  ENDOCRINOLOGIC: No reports of sweating, cold or heat intolerance. No polyuria or polydipsia.  Marland Kitchen   Physical Examination Vitals:   12/28/16 1146  BP: 117/69  Pulse: 78   Vitals:   12/28/16 1146  Weight: 271 lb (122.9 kg)  Height:  (1.854 m)    Gen: resting comfortably, no acute distress HEENT: no scleral icterus, pupils equal round and reactive, no palptable cervical adenopathy,  CV: RRR, no m/r/g, no jvd Resp: Clear to auscultation bilaterally GI: abdomen is soft, non-tender, non-distended, normal bowel sounds, no hepatosplenomegaly MSK: extremities are warm, no edema.  Skin: warm, no rash Neuro:  no focal deficits Psych: appropriate affect   Diagnostic Studies 03/2013 Echo Study Conclusions  - Left ventricle: Technically difficult study. There was mild concentric hypertrophy. Systolic function was mildly to moderately reduced. The estimated ejection fraction was in the range of 40% to 45%. Diffuse hypokinesis. The study is not technically sufficient to allow evaluation of LV diastolic function. - Mitral valve: Mildly thickened leaflets . - Left atrium: Moderately dilated. - Right atrium: The atrium was mildly dilated. 02/2014 Lexiscan MPI Analysis of the raw data did not demonstrate any significant  extracardiac radiotracer uptake. Analysis of the perfusion images  demonstrated a moderate size, moderately intense, basilar inferior  wall defect. Images were worse on rest than stress. Inferior soft  tissue attenuation also noted. Left ventricular  systolic function  was moderately reduced, calculated LV EF 37%. The aforementioned  wall is hypokinetic.  IMPRESSION:  1. Abnormal exercise Cardiolite stress test.  2. Rapid atrial fibrillation noted with exercise.  3. Fixed basal inferior wall defect. While this may represent  myocardial scar, interfering soft tissue attenuation cannot entirely  be excluded.  4. Moderately reduced LV systolic function, calculated LVEF 37%.  07/2015 echo Study Conclusions  - Left ventricle: The cavity size was normal. Wall thickness was normal. Systolic function was mildly reduced. The estimated ejection fraction was in the range of 45% to 50%. Wall motion was normal; there were no regional wall motion abnormalities. - Aortic valve: Valve area (VTI): 2.39 cm^2. Valve area (Vmax): 2.29 cm^2. - Mitral valve: There was mild regurgitation. - Left atrium: The atrium was severely dilated. - Right atrium: The atrium was severely dilated. - Atrial septum: No defect or patent foramen ovale was identified. - Technically  adequate study.   11/2015 Carotid US IMPRESSION: Complete occlusion of the left internal carotid artery near its origin.  Moderate eccentric calcified plaque is noted in the right carotid bulb and proximal right internal carotid artery consistent with 50-69% stenosis based on ultrasound and Doppler criteria.     Assessment and Plan  1. Chronic systolic heart failure  -no current symptoms - he will continue current meds  2. Afib  - no significant symptoms - CHADS2Vasc score is 6, including prior TIA. Continue anticoag   3. Carotid stenosis  - continue to follow with vascular.   4. Hyperlipidemia  - continue statin, we will order a lipid panel  5. OSA screen -encouraged again today to call and schedule his evaluation with Dr Juanetta Gosling  6. DM2 - repeat HgbA1c  F/u 6 months      Antoine Poche, M.D.

## 2016-12-28 NOTE — Patient Instructions (Signed)
Your physician wants you to follow-up in: 6 MONTHS WITH DR [facility] You will receive a reminder letter in the mail two months in advance. If you don't receive a letter, please call our office to schedule the follow-up appointment.  Your physician recommends that you continue on your current medications as directed. Please refer to the Current Medication list given to you today.  Your physician recommends that you return for lab work CBC/TSH/HGBA1C/TSH/MG/LIPIDS - PLEASE FAST PRIOR TO LAB WORK   Thank you for choosing [facility]!!

## 2017-01-11 ENCOUNTER — Ambulatory Visit (INDEPENDENT_AMBULATORY_CARE_PROVIDER_SITE_OTHER): Payer: Medicare HMO | Admitting: *Deleted

## 2017-01-11 DIAGNOSIS — Z5181 Encounter for therapeutic drug level monitoring: Secondary | ICD-10-CM | POA: Diagnosis not present

## 2017-01-11 DIAGNOSIS — I4891 Unspecified atrial fibrillation: Secondary | ICD-10-CM

## 2017-01-11 LAB — POCT INR: INR: 2.1

## 2017-01-11 MED ORDER — WARFARIN SODIUM 10 MG PO TABS: ORAL_TABLET | ORAL | 1 refills | Status: DC

## 2017-01-20 ENCOUNTER — Encounter: Payer: Self-pay | Admitting: Family

## 2017-01-26 ENCOUNTER — Ambulatory Visit (HOSPITAL_COMMUNITY): Admission: RE | Admit: 2017-01-26 | Payer: Medicare HMO | Source: Ambulatory Visit

## 2017-01-26 ENCOUNTER — Ambulatory Visit: Payer: Medicare HMO | Admitting: Family

## 2017-02-08 ENCOUNTER — Ambulatory Visit (INDEPENDENT_AMBULATORY_CARE_PROVIDER_SITE_OTHER): Payer: Medicare HMO | Admitting: *Deleted

## 2017-02-08 DIAGNOSIS — Z5181 Encounter for therapeutic drug level monitoring: Secondary | ICD-10-CM

## 2017-02-08 DIAGNOSIS — I4891 Unspecified atrial fibrillation: Secondary | ICD-10-CM

## 2017-02-08 LAB — POCT INR: INR: 1.8
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
## 2017-02-20 ENCOUNTER — Other Ambulatory Visit: Payer: Self-pay

## 2017-02-20 MED ORDER — ATORVASTATIN CALCIUM 40 MG PO TABS: ORAL_TABLET | ORAL | 3 refills | Status: DC

## 2017-02-20 MED ORDER — LISINOPRIL 2.5 MG PO TABS: 2.5000 mg | ORAL_TABLET | Freq: Every day | ORAL | 3 refills | Status: DC

## 2017-02-27 ENCOUNTER — Encounter: Payer: Self-pay | Admitting: *Deleted

## 2017-03-21 ENCOUNTER — Other Ambulatory Visit: Payer: Self-pay | Admitting: Cardiology

## 2017-04-03 ENCOUNTER — Ambulatory Visit (INDEPENDENT_AMBULATORY_CARE_PROVIDER_SITE_OTHER): Payer: Medicare HMO | Admitting: *Deleted

## 2017-04-03 DIAGNOSIS — Z5181 Encounter for therapeutic drug level monitoring: Secondary | ICD-10-CM | POA: Diagnosis not present

## 2017-04-03 DIAGNOSIS — I4891 Unspecified atrial fibrillation: Secondary | ICD-10-CM

## 2017-04-03 LAB — POCT INR: INR: 3.2

## 2017-06-09 ENCOUNTER — Encounter: Payer: Self-pay | Admitting: Cardiology

## 2017-06-09 ENCOUNTER — Ambulatory Visit (INDEPENDENT_AMBULATORY_CARE_PROVIDER_SITE_OTHER): Payer: Medicare HMO | Admitting: Cardiology

## 2017-06-09 VITALS — BP 138/78 | HR 84 | Ht 73.0 in | Wt 269.0 lb

## 2017-06-09 DIAGNOSIS — I4891 Unspecified atrial fibrillation: Secondary | ICD-10-CM

## 2017-06-09 DIAGNOSIS — Z23 Encounter for immunization: Secondary | ICD-10-CM | POA: Diagnosis not present

## 2017-06-09 DIAGNOSIS — E782 Mixed hyperlipidemia: Secondary | ICD-10-CM | POA: Diagnosis not present

## 2017-06-09 DIAGNOSIS — I5022 Chronic systolic (congestive) heart failure: Secondary | ICD-10-CM | POA: Diagnosis not present

## 2017-06-09 NOTE — Progress Notes (Signed)
Clinical Summary Mr. Deskins is a 61 y.o.male  seen today for follow up of the following medical problems.   1. Chronic systolic heart failure  - 03/2013 LVEF 40-45%.  - repeat echo 07/2015 LVEF 45-50%, no WMA  - 02/25/14 Lexiscan MPI with fixed basal inferior defect, scar vs attenuation. LVEF 37%, basal inferior hypokinesis.     - no recent edema. No SOB or DOE. No chest pain.   2. Permanent afib  - He has not been interested in NOACs due to cost.  - isolated episode of palpitations about 2 months ago.   - no recent palpitations - no troubles on coumadin  3. Carotid stenosis  - followed by vascular, history of LICA chronic occlusion    4. Hyperlipidemia  -he remains compliant with statin  5. OSA screen  - + snore, + obesity. Denies somnolence. + afib, + chf  - he has not scheduled his sleep eval     SH: son working on masters at [employer]   Past Medical History:  Diagnosis Date  . Angina   . Atrial fibrillation (HCC)   . Atrial flutter (HCC) 2007   h/o  . Blood dyscrasia   . Coronary artery disease 08/19/11  . Diabetes mellitus    diet controlled  . Gilbert's syndrome   . Hyperlipidemia   . Hypertension   . Peripheral vascular disease (HCC)   . Stroke [facility]) ~08/17/11   "lost vision left eye"  . Vertigo      Allergies  Allergen Reactions  . Other Other (See Comments)    Mayonnaise - irritation in nasal passages if breathe around it   . Penicillins Rash     Current Outpatient Prescriptions  Medication Sig Dispense Refill  . aspirin 81 MG tablet Take 81 mg by mouth daily.    Marland Kitchen atorvastatin (LIPITOR) 40 MG tablet TAKE ONE TABLET BY MOUTH ONCE DAILY 90 tablet 3  . Cholecalciferol (VITAMIN D3) 400 units CAPS Take 1 capsule by mouth daily.    . furosemide (LASIX) 20 MG tablet TAKE 1 TABLET BY MOUTH ONCE DAILY 30 tablet 1  . lisinopril (PRINIVIL,ZESTRIL) 2.5 MG tablet Take 1 tablet (2.5 mg total) by mouth daily. 90 tablet 3  . metoprolol  succinate (TOPROL-XL) 100 MG 24 hr tablet TAKE ONE TABLET BY MOUTH TWICE DAILY WITH OR IMMEDIATELY FOLLOWING A MEAL **DOSE INCREASE** 180 tablet 3  . Omega-3 Fatty Acids (FISH OIL) 1200 MG CAPS Take 1 capsule by mouth daily.    Marland Kitchen warfarin (COUMADIN) 10 MG tablet TAKE ONE TABLET BY MOUTH ONCE DAILY EXCEPT ONE-HALF TABLET ON SUNDAYS AND WEDNESDAYS per Misty Stanley 90 tablet 1   No current facility-administered medications for this visit.      Past Surgical History:  Procedure Laterality Date  . ACHILLES TENDON REPAIR  06/2002   Left  . CARDIOVERSION  2007  . CAROTID ANGIOGRAM  08/19/2011   Procedure: CAROTID ANGIOGRAM;  Surgeon: Chuck Hint, MD;  Location: [facility] CATH LAB;  Service: Cardiovascular;;  . CEREBRAL ANGIOGRAM N/A 08/19/2011   Procedure: CEREBRAL ANGIOGRAM;  Surgeon: Chuck Hint, MD;  Location[facility] CATH LAB;  Service: Cardiovascular;  Laterality: N/A;     Allergies  Allergen Reactions  . Other Other (See Comments)    Mayonnaise - irritation in nasal passages if breathe around it   . Penicillins Rash      Family History  Problem Relation Age of Onset  . Diabetes Mother   . Hyperlipidemia Mother   .  Hypertension Mother   . Emphysema Father   . Heart failure Father   . Heart attack Brother   . Aneurysm Sister   . Deep vein thrombosis Sister   . Diabetes Sister   . Heart attack Sister      Social History Mr. Sharpley reports that he quit smoking about 5 years ago. His smoking use included Cigars. He started smoking about 21 years ago. He has a 0.10 pack-year smoking history. He has never used smokeless tobacco. Mr. Erny reports that he drinks about 1.8 oz of alcohol per week .   Review of Systems CONSTITUTIONAL: No weight loss, fever, chills, weakness or fatigue.  HEENT: Eyes: No visual loss, blurred vision, double vision or yellow sclerae.No hearing loss, sneezing, congestion, runny nose or sore throat.  SKIN: No rash or itching.  CARDIOVASCULAR: per  hpi RESPIRATORY: No shortness of breath, cough or sputum.  GASTROINTESTINAL: No anorexia, nausea, vomiting or diarrhea. No abdominal pain or blood.  GENITOURINARY: No burning on urination, no polyuria NEUROLOGICAL: No headache, dizziness, syncope, paralysis, ataxia, numbness or tingling in the extremities. No change in bowel or bladder control.  MUSCULOSKELETAL: No muscle, back pain, joint pain or stiffness.  LYMPHATICS: No enlarged nodes. No history of splenectomy.  PSYCHIATRIC: No history of depression or anxiety.  ENDOCRINOLOGIC: No reports of sweating, cold or heat intolerance. No polyuria or polydipsia.  Marland Kitchen   Physical Examination Vitals:   06/09/17 1149  BP: 138/78  Pulse: 84  SpO2: 98%   Vitals:   06/09/17 1149  Weight: 269 lb (122 kg)  Height:  (1.854 m)    Gen: resting comfortably, no acute distress HEENT: no scleral icterus, pupils equal round and reactive, no palptable cervical adenopathy,  CV: RRR, no m/r/g, no jvd Resp: Clear to auscultation bilaterally GI: abdomen is soft, non-tender, non-distended, normal bowel sounds, no hepatosplenomegaly MSK: extremities are warm, no edema.  Skin: warm, no rash Neuro:  no focal deficits Psych: appropriate affect   Diagnostic Studies 03/2013 Echo Study Conclusions  - Left ventricle: Technically difficult study. There was mild concentric hypertrophy. Systolic function was mildly to moderately reduced. The estimated ejection fraction was in the range of 40% to 45%. Diffuse hypokinesis. The study is not technically sufficient to allow evaluation of LV diastolic function. - Mitral valve: Mildly thickened leaflets . - Left atrium: Moderately dilated. - Right atrium: The atrium was mildly dilated. 02/2014 Lexiscan MPI Analysis of the raw data did not demonstrate any significant  extracardiac radiotracer uptake. Analysis of the perfusion images  demonstrated a moderate size, moderately intense, basilar inferior   wall defect. Images were worse on rest than stress. Inferior soft  tissue attenuation also noted. Left ventricular systolic function  was moderately reduced, calculated LV EF 37%. The aforementioned  wall is hypokinetic.  IMPRESSION:  1. Abnormal exercise Cardiolite stress test.  2. Rapid atrial fibrillation noted with exercise.  3. Fixed basal inferior wall defect. While this may represent  myocardial scar, interfering soft tissue attenuation cannot entirely  be excluded.  4. Moderately reduced LV systolic function, calculated LVEF 37%.  07/2015 echo Study Conclusions  - Left ventricle: The cavity size was normal. Wall thickness was normal. Systolic function was mildly reduced. The estimated ejection fraction was in the range of 45% to 50%. Wall motion was normal; there were no regional wall motion abnormalities. - Aortic valve: Valve area (VTI): 2.39 cm^2. Valve area (Vmax): 2.29 cm^2. - Mitral valve: There was mild regurgitation. - Left atrium: The  atrium was severely dilated. - Right atrium: The atrium was severely dilated. - Atrial septum: No defect or patent foramen ovale was identified. - Technically adequate study.   11/2015 Carotid US IMPRESSION: Complete occlusion of the left internal carotid artery near its origin.  Moderate eccentric calcified plaque is noted in the right carotid bulb and proximal right internal carotid artery consistent with 50-69% stenosis based on ultrasound and Doppler criteria.    Assessment and Plan   1. Chronic systolic heart failure  - no symptoms, he will conitnue currnent meds  2. Afib  - he is asymptomatic - CHADS2Vasc score is 6, including prior TIA. Continue anticoag - continue current meds  3. Carotid stenosis  - continue to follow with vascular.   4. Hyperlipidemia  - he will continue statin, order fastling lipid panel  5. OSA screen -encouragedagain today to call and schedule his  evaluation with Dr Juanetta Gosling    F/u 6 months     Antoine Poche, M.D.,

## 2017-06-09 NOTE — Patient Instructions (Signed)
Medication Instructions:  Your physician recommends that you continue on your current medications as directed. Please refer to the Current Medication list given to you today.   Labwork: asap    Testing/Procedures: none  Follow-Up: Your physician wants you to follow-up in: 6 months  You will receive a reminder letter in the mail two months in advance. If you don't receive a letter, please call our office to schedule the follow-up appointment.\  Any Other Special Instructions Will Be Listed Below (If Applicable).     If you need a refill on your cardiac medications before your next appointment, please call your pharmacy.   

## 2017-06-21 ENCOUNTER — Encounter: Payer: Self-pay | Admitting: *Deleted

## 2017-07-12 ENCOUNTER — Other Ambulatory Visit: Payer: Self-pay | Admitting: Cardiology

## 2017-07-12 ENCOUNTER — Encounter: Payer: Medicare HMO | Admitting: *Deleted

## 2017-07-14 ENCOUNTER — Ambulatory Visit (INDEPENDENT_AMBULATORY_CARE_PROVIDER_SITE_OTHER): Payer: Medicare HMO | Admitting: *Deleted

## 2017-07-14 DIAGNOSIS — Z5181 Encounter for therapeutic drug level monitoring: Secondary | ICD-10-CM | POA: Diagnosis not present

## 2017-07-14 DIAGNOSIS — I4891 Unspecified atrial fibrillation: Secondary | ICD-10-CM

## 2017-07-14 LAB — POCT INR: INR: 1.7

## 2017-07-14 MED ORDER — WARFARIN SODIUM 10 MG PO TABS: ORAL_TABLET | ORAL | 2 refills | Status: DC

## 2017-07-14 NOTE — Progress Notes (Signed)
Take coumadin 1 1/2 tablets today and tomorrow then reseume 1 tablet daily except 1/2 tablet on Sundays Recheck in 3 weeks

## 2017-07-26 ENCOUNTER — Other Ambulatory Visit: Payer: Self-pay | Admitting: Cardiology

## 2017-08-10 ENCOUNTER — Other Ambulatory Visit: Payer: Self-pay | Admitting: Cardiology

## 2017-08-18 ENCOUNTER — Encounter: Payer: Self-pay | Admitting: *Deleted

## 2017-10-11 ENCOUNTER — Ambulatory Visit (INDEPENDENT_AMBULATORY_CARE_PROVIDER_SITE_OTHER): Payer: Medicare HMO | Admitting: *Deleted

## 2017-10-11 DIAGNOSIS — I4891 Unspecified atrial fibrillation: Secondary | ICD-10-CM | POA: Diagnosis not present

## 2017-10-11 DIAGNOSIS — Z5181 Encounter for therapeutic drug level monitoring: Secondary | ICD-10-CM | POA: Diagnosis not present

## 2017-10-11 LAB — POCT INR: INR: 1.6

## 2017-10-11 MED ORDER — WARFARIN SODIUM 10 MG PO TABS: ORAL_TABLET | ORAL | 0 refills | Status: DC

## 2017-10-11 NOTE — Patient Instructions (Signed)
Take coumadin 1 1/2 tablets today and tomorrow then increase dose to 1 tablet daily  Recheck in 3 weeks

## 2017-10-12 ENCOUNTER — Other Ambulatory Visit: Payer: Self-pay | Admitting: Cardiology

## 2017-11-06 ENCOUNTER — Ambulatory Visit (INDEPENDENT_AMBULATORY_CARE_PROVIDER_SITE_OTHER): Payer: Medicare HMO | Admitting: *Deleted

## 2017-11-06 DIAGNOSIS — Z5181 Encounter for therapeutic drug level monitoring: Secondary | ICD-10-CM | POA: Diagnosis not present

## 2017-11-06 DIAGNOSIS — I4891 Unspecified atrial fibrillation: Secondary | ICD-10-CM

## 2017-11-06 LAB — POCT INR: INR: 2.6

## 2017-11-06 NOTE — Patient Instructions (Signed)
Continue coumadin 1 tablet daily Recheck in 4 weeks 

## 2017-12-06 ENCOUNTER — Encounter: Payer: Self-pay | Admitting: Cardiology

## 2017-12-06 ENCOUNTER — Ambulatory Visit (INDEPENDENT_AMBULATORY_CARE_PROVIDER_SITE_OTHER): Payer: Medicare HMO | Admitting: *Deleted

## 2017-12-06 ENCOUNTER — Ambulatory Visit (INDEPENDENT_AMBULATORY_CARE_PROVIDER_SITE_OTHER): Payer: Medicare HMO | Admitting: Cardiology

## 2017-12-06 VITALS — BP 152/88 | HR 97 | Ht 73.0 in | Wt 279.0 lb

## 2017-12-06 DIAGNOSIS — I6523 Occlusion and stenosis of bilateral carotid arteries: Secondary | ICD-10-CM

## 2017-12-06 DIAGNOSIS — G4730 Sleep apnea, unspecified: Secondary | ICD-10-CM | POA: Diagnosis not present

## 2017-12-06 DIAGNOSIS — I5022 Chronic systolic (congestive) heart failure: Secondary | ICD-10-CM

## 2017-12-06 DIAGNOSIS — Z5181 Encounter for therapeutic drug level monitoring: Secondary | ICD-10-CM

## 2017-12-06 DIAGNOSIS — I4891 Unspecified atrial fibrillation: Secondary | ICD-10-CM

## 2017-12-06 LAB — POCT INR: INR: 4.2

## 2017-12-06 NOTE — Patient Instructions (Signed)
Medication Instructions:  Your physician recommends that you continue on your current medications as directed. Please refer to the Current Medication list given to you today.   Labwork: none  Testing/Procedures: none  Follow-Up: Your physician wants you to follow-up in: 6 months .  You will receive a reminder letter in the mail two months in advance. If you don't receive a letter, please call our office to schedule the follow-up appointment.   Any Other Special Instructions Will Be Listed Below (If Applicable).   You have been referred to Dr Juanetta GoslingHawkins for sleep apnea    If you need a refill on your cardiac medications before your next appointment, please call your pharmacy.

## 2017-12-06 NOTE — Progress Notes (Signed)
Clinical Summary [name] is a 62 y.o.male seen today for follow up of the following medical problems.   1. Chronic systolic heart failure  - 03/2013 LVEF 40-45%.  - repeat echo 07/2015 LVEF 45-50%, no WMA  - 02/25/14 Lexiscan MPI with fixed basal inferior defect, scar vs attenuation. LVEF 37%, basal inferior hypokinesis.    - no recent SOB/DOE. No recent LE edema - compliant with meds  2. Permanent afib   - mild palpitations at times. He reports increased stress as the trigger - no bleeding on coumadin.    3. Carotid stenosis  - followed by vascular, history of LICA chronic occlusion  - no recent symptoms. Compliant with meds   4. Hyperlipidemia  - has labs next week with pcp - compliant with statin    5. OSA screen  - + snore, + obesity. Denies somnolence. + afib, + chf  - he has not scheduled his sleep eval      SH: son working on masters at [employer]    Past Medical History:  Diagnosis Date  . Angina   . Atrial fibrillation (HCC)   . Atrial flutter (HCC) 2007   h/o  . Blood dyscrasia   . Coronary artery disease 08/19/11  . Diabetes mellitus    diet controlled  . Gilbert's syndrome   . Hyperlipidemia   . Hypertension   . Peripheral vascular disease (HCC)   . Stroke [facility](HCC) ~08/17/11   "lost vision left eye"  . Vertigo      Allergies  Allergen Reactions  . Other Other (See Comments)    Mayonnaise - irritation in nasal passages if breathe around it   . Penicillins Rash     Current Outpatient Medications  Medication Sig Dispense Refill  . aspirin 81 MG tablet Take 81 mg by mouth daily.    Marland Kitchen. atorvastatin (LIPITOR) 40 MG tablet TAKE ONE TABLET BY MOUTH ONCE DAILY 90 tablet 3  . cholecalciferol (VITAMIN D) 1000 units tablet Take 1,000 Units by mouth daily.    . furosemide (LASIX) 20 MG tablet TAKE 1 TABLET BY MOUTH ONCE DAILY 30 tablet 6  . lisinopril (PRINIVIL,ZESTRIL) 2.5 MG tablet Take 1 tablet (2.5 mg total) by mouth daily. 90  tablet 3  . metoprolol succinate (TOPROL-XL) 100 MG 24 hr tablet TAKE ONE TABLET BY MOUTH TWICE DAILY WITH  OR  IMMEDIATELY  FOLLOWING  A  MEAL  **  DOSE  INCREASE** 60 tablet 0  . metoprolol succinate (TOPROL-XL) 100 MG 24 hr tablet TAKE ONE TABLET BY MOUTH TWICE DAILY WITH  OR  IMMEDIATELY  FOLLOWING  A  MEAL  **  DOSE  INCREASE** 180 tablet 3  . Omega-3 Fatty Acids (FISH OIL) 1200 MG CAPS Take 1 capsule by mouth daily.    Marland Kitchen. warfarin (COUMADIN) 10 MG tablet TAKE ONE TABLET BY MOUTH ONCE DAILY 90 tablet 0   No current facility-administered medications for this visit.      Past Surgical History:  Procedure Laterality Date  . ACHILLES TENDON REPAIR  06/2002   Left  . CARDIOVERSION  2007  . CAROTID ANGIOGRAM  08/19/2011   Procedure: CAROTID ANGIOGRAM;  Surgeon: Chuck Hinthristopher S Dickson, MD;  [facility];  Service: Cardiovascular;;  . CEREBRAL ANGIOGRAM N/A 08/19/2011   Procedure: CEREBRAL ANGIOGRAM;  Surgeon: Chuck Hinthristopher S Dickson, MD;  [facility];  Service: Cardiovascular;  Laterality: N/A;     Allergies  Allergen Reactions  . Other Other (See Comments)  Mayonnaise - irritation in nasal passages if breathe around it   . Penicillins Rash      Family History  Problem Relation Age of Onset  . Diabetes Mother   . Hyperlipidemia Mother   . Hypertension Mother   . Emphysema Father   . Heart failure Father   . Heart attack Brother   . Aneurysm Sister   . Deep vein thrombosis Sister   . Diabetes Sister   . Heart attack Sister      Social History [name] reports that he quit smoking about 6 years ago. His smoking use included cigars. He started smoking about 21 years ago. He has a 0.10 pack-year smoking history. He has never used smokeless tobacco. [name] reports that he drinks about 1.8 oz of alcohol per week.   Review of Systems CONSTITUTIONAL: No weight loss, fever, chills, weakness or fatigue.  HEENT: Eyes: No visual loss, blurred vision, double  vision or yellow sclerae.No hearing loss, sneezing, congestion, runny nose or sore throat.  SKIN: No rash or itching.  CARDIOVASCULAR: per hpi RESPIRATORY: No shortness of breath, cough or sputum.  GASTROINTESTINAL: No anorexia, nausea, vomiting or diarrhea. No abdominal pain or blood.  GENITOURINARY: No burning on urination, no polyuria NEUROLOGICAL: No headache, dizziness, syncope, paralysis, ataxia, numbness or tingling in the extremities. No change in bowel or bladder control.  MUSCULOSKELETAL: No muscle, back pain, joint pain or stiffness.  LYMPHATICS: No enlarged nodes. No history of splenectomy.  PSYCHIATRIC: No history of depression or anxiety.  ENDOCRINOLOGIC: No reports of sweating, cold or heat intolerance. No polyuria or polydipsia.  Marland Kitchen   Physical Examination Vitals:   12/06/17 1108  BP: (!) 152/88  Pulse: 97  SpO2: 99%   Vitals:   12/06/17 1108  Weight: 279 lb (126.6 kg)  Height: 6\' 1"  (1.854 m)    Gen: resting comfortably, no acute distress HEENT: no scleral icterus, pupils equal round and reactive, no palptable cervical adenopathy,  CV: RRR, no m/r/g, no jvd Resp: Clear to auscultation bilaterally GI: abdomen is soft, non-tender, non-distended, normal bowel sounds, no hepatosplenomegaly MSK: extremities are warm, no edema.  Skin: warm, no rash Neuro:  no focal deficits Psych: appropriate affect   Diagnostic Studies 03/2013 Echo Study Conclusions  - Left ventricle: Technically difficult study. There was mild concentric hypertrophy. Systolic function was mildly to moderately reduced. The estimated ejection fraction was in the range of 40% to 45%. Diffuse hypokinesis. The study is not technically sufficient to allow evaluation of LV diastolic function. - Mitral valve: Mildly thickened leaflets . - Left atrium: Moderately dilated. - Right atrium: The atrium was mildly dilated. 02/2014 Lexiscan MPI Analysis of the raw data did not demonstrate any  significant  extracardiac radiotracer uptake. Analysis of the perfusion images  demonstrated a moderate size, moderately intense, basilar inferior  wall defect. Images were worse on rest than stress. Inferior soft  tissue attenuation also noted. Left ventricular systolic function  was moderately reduced, calculated LV EF 37%. The aforementioned  wall is hypokinetic.  IMPRESSION:  1. Abnormal exercise Cardiolite stress test.  2. Rapid atrial fibrillation noted with exercise.  3. Fixed basal inferior wall defect. While this may represent  myocardial scar, interfering soft tissue attenuation cannot entirely  be excluded.  4. Moderately reduced LV systolic function, calculated LVEF 37%.  07/2015 echo Study Conclusions  - Left ventricle: The cavity size was normal. Wall thickness was normal. Systolic function was mildly reduced. The estimated ejection fraction was in the  range of 45% to 50%. Wall motion was normal; there were no regional wall motion abnormalities. - Aortic valve: Valve area (VTI): 2.39 cm^2. Valve area (Vmax): 2.29 cm^2. - Mitral valve: There was mild regurgitation. - Left atrium: The atrium was severely dilated. - Right atrium: The atrium was severely dilated. - Atrial septum: No defect or patent foramen ovale was identified. - Technically adequate study.   11/2015 Carotid US IMPRESSION: Complete occlusion of the left internal carotid artery near its origin.  Moderate eccentric calcified plaque is noted in the right carotid bulb and proximal right internal carotid artery consistent with 50-69% stenosis based on ultrasound and Doppler criteria.     Assessment and Plan  1. Chronic systolic heart failure  - no symptoms, continue current meds  2. Afib  - CHADS2Vasc score is 6, including prior TIA. Continue anticoag - no recent symptoms, continue current meds  3. Carotid stenosis  - continue medical therapy - continue to  follow with vascular.   4. Hyperlipidemia  -continue statin  5. OSA screen - I encouraged him to schedule his appt with Dr Juanetta Gosling for evaluation  6. HTN - elevated in clinilc, has not takenmeds yet today. Previously controlled, continue current meds       Antoine Poche, M.D.

## 2017-12-06 NOTE — Patient Instructions (Signed)
Hold coumadin tonight then decrease dose to 1 tablet daily except 1/2 tablet on Saturdays Recheck in 3 weeks

## 2017-12-08 ENCOUNTER — Telehealth: Payer: Self-pay | Admitting: *Deleted

## 2017-12-08 NOTE — Telephone Encounter (Signed)
Patient notified that [employer] forms are complete.

## 2017-12-10 ENCOUNTER — Encounter: Payer: Self-pay | Admitting: Cardiology

## 2017-12-28 NOTE — Progress Notes (Signed)
Order(s) created erroneously. Erroneous order ID: [id number] ° Order moved by: GIORDANO, PAUL A ° Order move date/time: 12/28/2017 10:42 AM ° Source Patient: [id number] ° Source Contact: 12/21/2017 ° Destination Patient: [id number] ° Destination Contact: 02/11/2015 °

## 2018-01-15 ENCOUNTER — Other Ambulatory Visit: Payer: Self-pay | Admitting: Cardiology

## 2018-01-31 ENCOUNTER — Ambulatory Visit (INDEPENDENT_AMBULATORY_CARE_PROVIDER_SITE_OTHER): Payer: Medicare HMO | Admitting: *Deleted

## 2018-01-31 DIAGNOSIS — Z5181 Encounter for therapeutic drug level monitoring: Secondary | ICD-10-CM

## 2018-01-31 DIAGNOSIS — I4891 Unspecified atrial fibrillation: Secondary | ICD-10-CM

## 2018-01-31 LAB — POCT INR: INR: 2 (ref 2.0–3.0)

## 2018-01-31 MED ORDER — WARFARIN SODIUM 10 MG PO TABS: ORAL_TABLET | ORAL | 3 refills | Status: DC

## 2018-01-31 NOTE — Patient Instructions (Signed)
Continue coumadin 1 tablet daily except 1/2 tablet on Saturdays Recheck in 4 weeks 

## 2018-03-01 ENCOUNTER — Encounter: Payer: Self-pay | Admitting: *Deleted

## 2018-03-16 ENCOUNTER — Other Ambulatory Visit: Payer: Self-pay | Admitting: Cardiology

## 2018-03-22 DIAGNOSIS — E782 Mixed hyperlipidemia: Secondary | ICD-10-CM | POA: Diagnosis not present

## 2018-03-22 DIAGNOSIS — I10 Essential (primary) hypertension: Secondary | ICD-10-CM | POA: Diagnosis not present

## 2018-03-22 DIAGNOSIS — Z7901 Long term (current) use of anticoagulants: Secondary | ICD-10-CM | POA: Diagnosis not present

## 2018-03-22 DIAGNOSIS — Z5181 Encounter for therapeutic drug level monitoring: Secondary | ICD-10-CM | POA: Diagnosis not present

## 2018-03-22 DIAGNOSIS — H5440 Blindness, one eye, unspecified eye: Secondary | ICD-10-CM | POA: Diagnosis not present

## 2018-03-22 DIAGNOSIS — Z720 Tobacco use: Secondary | ICD-10-CM | POA: Diagnosis not present

## 2018-03-22 DIAGNOSIS — E559 Vitamin D deficiency, unspecified: Secondary | ICD-10-CM | POA: Diagnosis not present

## 2018-03-22 DIAGNOSIS — Z131 Encounter for screening for diabetes mellitus: Secondary | ICD-10-CM | POA: Diagnosis not present

## 2018-03-22 DIAGNOSIS — Z024 Encounter for examination for driving license: Secondary | ICD-10-CM | POA: Diagnosis not present

## 2018-03-22 DIAGNOSIS — I482 Chronic atrial fibrillation: Secondary | ICD-10-CM | POA: Diagnosis not present

## 2018-03-23 ENCOUNTER — Telehealth: Payer: Self-pay | Admitting: *Deleted

## 2018-03-23 ENCOUNTER — Ambulatory Visit (INDEPENDENT_AMBULATORY_CARE_PROVIDER_SITE_OTHER): Payer: Medicare HMO | Admitting: *Deleted

## 2018-03-23 DIAGNOSIS — Z5181 Encounter for therapeutic drug level monitoring: Secondary | ICD-10-CM | POA: Diagnosis not present

## 2018-03-23 DIAGNOSIS — I4891 Unspecified atrial fibrillation: Secondary | ICD-10-CM

## 2018-03-23 LAB — POCT INR: INR: 1.3 — AB (ref 2.0–3.0)

## 2018-03-23 NOTE — Telephone Encounter (Signed)
Done.  See coumadin note. 

## 2018-03-23 NOTE — Telephone Encounter (Signed)
Being established at her clinic. INR checked yesterday was 1.3. Dr. Linton Rumpalbert is requesting that we adjust dosage and contact patient this time and afterward she has all his records for review, she will start managing his INR's. Records faxed per her request.

## 2018-03-23 NOTE — Patient Instructions (Signed)
Take coumadin 1 1/2 tablets tonight and tomorrow night then resume 1 tablet daily except 1/2 tablet on Saturdays. LM on cell phone for pt with instructions and need to get INR recheck in 7 - 10 days either here or at Dr Missy Sabinsalbert's office.  He has requested to have coumadin managed there since it is closer to his home. No answer on home phone and voice mail not set up. Recheck in 4 weeks

## 2018-03-28 ENCOUNTER — Encounter: Payer: Self-pay | Admitting: Nurse Practitioner

## 2018-03-29 ENCOUNTER — Encounter: Payer: Self-pay | Admitting: Nurse Practitioner

## 2018-05-01 DIAGNOSIS — E119 Type 2 diabetes mellitus without complications: Secondary | ICD-10-CM | POA: Diagnosis not present

## 2018-05-01 DIAGNOSIS — H43391 Other vitreous opacities, right eye: Secondary | ICD-10-CM | POA: Diagnosis not present

## 2018-05-01 DIAGNOSIS — H2513 Age-related nuclear cataract, bilateral: Secondary | ICD-10-CM | POA: Diagnosis not present

## 2018-06-29 ENCOUNTER — Ambulatory Visit: Payer: Medicare HMO | Admitting: Gastroenterology

## 2018-06-29 ENCOUNTER — Telehealth: Payer: Self-pay | Admitting: Gastroenterology

## 2018-06-29 ENCOUNTER — Encounter: Payer: Self-pay | Admitting: Gastroenterology

## 2018-06-29 NOTE — Telephone Encounter (Signed)
Patient was a no show and letter sent  °

## 2018-07-02 ENCOUNTER — Ambulatory Visit: Payer: Medicare HMO | Admitting: Nurse Practitioner

## 2018-07-05 ENCOUNTER — Other Ambulatory Visit: Payer: Self-pay | Admitting: Cardiology

## 2018-07-07 ENCOUNTER — Other Ambulatory Visit: Payer: Self-pay | Admitting: Cardiology

## 2018-07-12 ENCOUNTER — Other Ambulatory Visit: Payer: Self-pay | Admitting: Cardiology

## 2018-07-13 ENCOUNTER — Other Ambulatory Visit: Payer: Self-pay | Admitting: Cardiology

## 2018-07-16 ENCOUNTER — Other Ambulatory Visit: Payer: Self-pay | Admitting: Cardiology

## 2018-07-16 MED ORDER — ATORVASTATIN CALCIUM 40 MG PO TABS: 40.0000 mg | ORAL_TABLET | Freq: Every day | ORAL | 1 refills | Status: DC

## 2018-07-16 MED ORDER — FUROSEMIDE 20 MG PO TABS: 20.0000 mg | ORAL_TABLET | Freq: Every day | ORAL | 6 refills | Status: DC

## 2018-07-16 MED ORDER — METOPROLOL SUCCINATE ER 100 MG PO TB24: ORAL_TABLET | ORAL | 0 refills | Status: DC

## 2018-07-16 MED ORDER — LISINOPRIL 2.5 MG PO TABS: 2.5000 mg | ORAL_TABLET | Freq: Every day | ORAL | 0 refills | Status: DC

## 2018-07-16 NOTE — Telephone Encounter (Signed)
Per pt phone call-- he's out of all his meds and needing refills. Has an apt scheduled in [place] w/ [facility] on 12/23

## 2018-07-16 NOTE — Telephone Encounter (Signed)
Refilled meds accept coumadin, pt has apt with [name] in 2 days

## 2018-07-18 ENCOUNTER — Ambulatory Visit (INDEPENDENT_AMBULATORY_CARE_PROVIDER_SITE_OTHER): Payer: Medicare HMO | Admitting: *Deleted

## 2018-07-18 DIAGNOSIS — I4891 Unspecified atrial fibrillation: Secondary | ICD-10-CM

## 2018-07-18 DIAGNOSIS — Z5181 Encounter for therapeutic drug level monitoring: Secondary | ICD-10-CM

## 2018-07-18 LAB — POCT INR: INR: 1.1 — AB (ref 2.0–3.0)

## 2018-07-18 MED ORDER — WARFARIN SODIUM 10 MG PO TABS: ORAL_TABLET | ORAL | 1 refills | Status: DC

## 2018-07-18 NOTE — Patient Instructions (Addendum)
Has been out of coumadin x 4 days Restart coumadin 10mg  daily except 5mg  on Saturdays Wants to come back here for coumadin checks. Recheck in 3 weeks

## 2018-08-15 ENCOUNTER — Ambulatory Visit (INDEPENDENT_AMBULATORY_CARE_PROVIDER_SITE_OTHER): Payer: Medicare HMO | Admitting: *Deleted

## 2018-08-15 DIAGNOSIS — Z5181 Encounter for therapeutic drug level monitoring: Secondary | ICD-10-CM | POA: Diagnosis not present

## 2018-08-15 DIAGNOSIS — I4891 Unspecified atrial fibrillation: Secondary | ICD-10-CM

## 2018-08-15 LAB — POCT INR: INR: 1.7 — AB (ref 2.0–3.0)

## 2018-08-15 NOTE — Patient Instructions (Signed)
Take coumadin extra 1/2 tablet today then increase dose to to 1 tablet daily (10mg ) Recheck in 4 weeks

## 2018-08-27 ENCOUNTER — Ambulatory Visit: Payer: Medicare HMO | Admitting: Cardiology

## 2018-09-12 ENCOUNTER — Ambulatory Visit (INDEPENDENT_AMBULATORY_CARE_PROVIDER_SITE_OTHER): Payer: Medicare HMO | Admitting: Pharmacist

## 2018-09-12 DIAGNOSIS — I4891 Unspecified atrial fibrillation: Secondary | ICD-10-CM

## 2018-09-12 DIAGNOSIS — Z5181 Encounter for therapeutic drug level monitoring: Secondary | ICD-10-CM

## 2018-09-12 LAB — POCT INR: INR: 2 (ref 2.0–3.0)

## 2018-09-12 MED ORDER — WARFARIN SODIUM 10 MG PO TABS: ORAL_TABLET | ORAL | 1 refills | Status: DC

## 2018-09-12 NOTE — Patient Instructions (Signed)
Description   Continue 1 tablet daily (10mg ).  Recheck in 4 weeks

## 2018-09-13 ENCOUNTER — Other Ambulatory Visit: Payer: Self-pay | Admitting: Cardiology

## 2018-09-20 ENCOUNTER — Ambulatory Visit: Payer: Medicare HMO | Admitting: Cardiology

## 2018-09-20 NOTE — Progress Notes (Deleted)
Clinical Summary [name] is a 63 y.o.male seen today for follow up of the following medical problems.   1. Chronic systolic heart failure  - 03/2013 LVEF 40-45%.  - repeat echo 07/2015 LVEF 45-50%, no WMA  - 02/25/14 Lexiscan MPI with fixed basal inferior defect, scar vs attenuation. LVEF 37%, basal inferior hypokinesis.    - no recent SOB/DOE. No recent LE edema - compliant with meds  2. Permanent afib   - mild palpitations at times. He reports increased stress as the trigger - no bleeding on coumadin.    3. Carotid stenosis  - followed by vascular, history of LICA chronic occlusion  - no recent symptoms. Compliant with meds   4. Hyperlipidemia  - has labs next week with pcp - compliant with statin    5. OSA screen  - + snore, + obesity. Denies somnolence. + afib, + chf - he has not scheduled his sleep eval      SH: son working on masters at [employer]   Past Medical History:  Diagnosis Date  . Angina   . Atrial fibrillation (HCC)   . Atrial flutter (HCC) 2007   h/o  . Blood dyscrasia   . Coronary artery disease 08/19/11  . Diabetes mellitus    diet controlled  . Gilbert's syndrome   . Hyperlipidemia   . Hypertension   . Peripheral vascular disease (HCC)   . Stroke [facility](HCC) ~08/17/11   "lost vision left eye"  . Vertigo      Allergies  Allergen Reactions  . Other Other (See Comments)    Mayonnaise - irritation in nasal passages if breathe around it   . Penicillins Rash     Current Outpatient Medications  Medication Sig Dispense Refill  . aspirin 81 MG tablet Take 81 mg by mouth daily.    Marland Kitchen. atorvastatin (LIPITOR) 40 MG tablet Take 1 tablet (40 mg total) by mouth daily. 90 tablet 1  . cholecalciferol (VITAMIN D) 1000 units tablet Take 1,000 Units by mouth daily.    . furosemide (LASIX) 20 MG tablet Take 1 tablet (20 mg total) by mouth daily. 30 tablet 6  . lisinopril (PRINIVIL,ZESTRIL) 2.5 MG tablet Take 1 tablet (2.5 mg total)  by mouth daily. 90 tablet 0  . metoprolol succinate (TOPROL-XL) 100 MG 24 hr tablet TAKE ONE TABLET BY MOUTH TWICE DAILY WITH  OR  IMMEDIATELY  FOLLOWING  A  MEAL  **  DOSE  INCREASE** 180 tablet 0  . Omega-3 Fatty Acids (FISH OIL) 1200 MG CAPS Take 1 capsule by mouth daily.    Marland Kitchen. warfarin (COUMADIN) 10 MG tablet TAKE 1 TABLET BY MOUTH ONCE DAILY EXCEPT  1/2  TABLET  ON  SATURDAYS 30 tablet 3   No current facility-administered medications for this visit.      Past Surgical History:  Procedure Laterality Date  . ACHILLES TENDON REPAIR  06/2002   Left  . CARDIOVERSION  2007  . CAROTID ANGIOGRAM  08/19/2011   Procedure: CAROTID ANGIOGRAM;  Surgeon: Chuck Hinthristopher S Dickson, MD;  [facility];  Service: Cardiovascular;;  . CEREBRAL ANGIOGRAM N/A 08/19/2011   Procedure: CEREBRAL ANGIOGRAM;  Surgeon: Chuck Hinthristopher S Dickson, MD;  [facility];  Service: Cardiovascular;  Laterality: N/A;     Allergies  Allergen Reactions  . Other Other (See Comments)    Mayonnaise - irritation in nasal passages if breathe around it   . Penicillins Rash      Family History  Problem Relation Age of Onset  . Diabetes Mother   . Hyperlipidemia Mother   . Hypertension Mother   . Emphysema Father   . Heart failure Father   . Heart attack Brother   . Aneurysm Sister   . Deep vein thrombosis Sister   . Diabetes Sister   . Heart attack Sister      Social History [name] reports that he quit smoking about 7 years ago. His smoking use included cigars. He started smoking about 22 years ago. He has a 0.10 pack-year smoking history. He has never used smokeless tobacco. [name] reports current alcohol use of about 3.0 standard drinks of alcohol per week.   Review of Systems CONSTITUTIONAL: No weight loss, fever, chills, weakness or fatigue.  HEENT: Eyes: No visual loss, blurred vision, double vision or yellow sclerae.No hearing loss, sneezing, congestion, runny nose or sore throat.    SKIN: No rash or itching.  CARDIOVASCULAR:  RESPIRATORY: No shortness of breath, cough or sputum.  GASTROINTESTINAL: No anorexia, nausea, vomiting or diarrhea. No abdominal pain or blood.  GENITOURINARY: No burning on urination, no polyuria NEUROLOGICAL: No headache, dizziness, syncope, paralysis, ataxia, numbness or tingling in the extremities. No change in bowel or bladder control.  MUSCULOSKELETAL: No muscle, back pain, joint pain or stiffness.  LYMPHATICS: No enlarged nodes. No history of splenectomy.  PSYCHIATRIC: No history of depression or anxiety.  ENDOCRINOLOGIC: No reports of sweating, cold or heat intolerance. No polyuria or polydipsia.  Marland Kitchen.   Physical Examination There were no vitals filed for this visit. There were no vitals filed for this visit.  Gen: resting comfortably, no acute distress HEENT: no scleral icterus, pupils equal round and reactive, no palptable cervical adenopathy,  CV Resp: Clear to auscultation bilaterally GI: abdomen is soft, non-tender, non-distended, normal bowel sounds, no hepatosplenomegaly MSK: extremities are warm, no edema.  Skin: warm, no rash Neuro:  no focal deficits Psych: appropriate affect   Diagnostic Studies 03/2013 Echo Study Conclusions  - Left ventricle: Technically difficult study. There was mild concentric hypertrophy. Systolic function was mildly to moderately reduced. The estimated ejection fraction was in the range of 40% to 45%. Diffuse hypokinesis. The study is not technically sufficient to allow evaluation of LV diastolic function. - Mitral valve: Mildly thickened leaflets . - Left atrium: Moderately dilated. - Right atrium: The atrium was mildly dilated. 02/2014 Lexiscan MPI Analysis of the raw data did not demonstrate any significant  extracardiac radiotracer uptake. Analysis of the perfusion images  demonstrated a moderate size, moderately intense, basilar inferior  wall defect. Images were worse on rest  than stress. Inferior soft  tissue attenuation also noted. Left ventricular systolic function  was moderately reduced, calculated LV EF 37%. The aforementioned  wall is hypokinetic.  IMPRESSION:  1. Abnormal exercise Cardiolite stress test.  2. Rapid atrial fibrillation noted with exercise.  3. Fixed basal inferior wall defect. While this may represent  myocardial scar, interfering soft tissue attenuation cannot entirely  be excluded.  4. Moderately reduced LV systolic function, calculated LVEF 37%.  07/2015 echo Study Conclusions  - Left ventricle: The cavity size was normal. Wall thickness was normal. Systolic function was mildly reduced. The estimated ejection fraction was in the range of 45% to 50%. Wall motion was normal; there were no regional wall motion abnormalities. - Aortic valve: Valve area (VTI): 2.39 cm^2. Valve area (Vmax): 2.29 cm^2. - Mitral valve: There was mild regurgitation. - Left atrium: The atrium was severely dilated. - Right  atrium: The atrium was severely dilated. - Atrial septum: No defect or patent foramen ovale was identified. - Technically adequate study.   11/2015 Carotid US IMPRESSION: Complete occlusion of the left internal carotid artery near its origin.  Moderate eccentric calcified plaque is noted in the right carotid bulb and proximal right internal carotid artery consistent with 50-69% stenosis based on ultrasound and Doppler criteria.    Assessment and Plan  1. Chronic systolic heart failure  - no symptoms, continue current meds  2. Afib  - CHADS2Vasc score is 6, including prior TIA. Continue anticoag - no recent symptoms, continue current meds  3. Carotid stenosis  - continue medical therapy - continue to follow with vascular.   4. Hyperlipidemia  -continue statin  5. OSA screen - I encouraged him to schedule his appt with Dr Juanetta Gosling for evaluation  6. HTN - elevated in clinilc, has not  takenmeds yet today. Previously controlled, continue current meds      Antoine Poche, M.D., F.A.C.C.

## 2018-10-10 ENCOUNTER — Ambulatory Visit (INDEPENDENT_AMBULATORY_CARE_PROVIDER_SITE_OTHER): Payer: Medicare HMO | Admitting: Pharmacist

## 2018-10-10 DIAGNOSIS — I4891 Unspecified atrial fibrillation: Secondary | ICD-10-CM | POA: Diagnosis not present

## 2018-10-10 DIAGNOSIS — Z5181 Encounter for therapeutic drug level monitoring: Secondary | ICD-10-CM | POA: Diagnosis not present

## 2018-10-10 LAB — POCT INR: INR: 2.3 (ref 2.0–3.0)

## 2018-10-10 MED ORDER — WARFARIN SODIUM 10 MG PO TABS: ORAL_TABLET | ORAL | 3 refills | Status: DC

## 2018-10-10 NOTE — Patient Instructions (Signed)
Description   Continue 1 tablet daily (10mg ).  Recheck in 6 weeks

## 2018-11-02 ENCOUNTER — Ambulatory Visit (INDEPENDENT_AMBULATORY_CARE_PROVIDER_SITE_OTHER): Payer: Medicare HMO | Admitting: Cardiology

## 2018-11-02 ENCOUNTER — Encounter: Payer: Self-pay | Admitting: *Deleted

## 2018-11-02 ENCOUNTER — Encounter: Payer: Self-pay | Admitting: Cardiology

## 2018-11-02 VITALS — BP 119/73 | HR 78 | Ht 73.0 in | Wt 274.0 lb

## 2018-11-02 DIAGNOSIS — I4891 Unspecified atrial fibrillation: Secondary | ICD-10-CM

## 2018-11-02 DIAGNOSIS — I6529 Occlusion and stenosis of unspecified carotid artery: Secondary | ICD-10-CM

## 2018-11-02 DIAGNOSIS — E782 Mixed hyperlipidemia: Secondary | ICD-10-CM

## 2018-11-02 DIAGNOSIS — I5022 Chronic systolic (congestive) heart failure: Secondary | ICD-10-CM

## 2018-11-02 DIAGNOSIS — I10 Essential (primary) hypertension: Secondary | ICD-10-CM

## 2018-11-02 NOTE — Progress Notes (Signed)
Clinical Summary [name] is a 63 y.o.male seen today for follow up of the following medical problems.   1. Chronic systolic heart failure  - 03/2013 LVEF 40-45%.  - repeat echo 07/2015 LVEF 45-50%, no WMA  - 02/25/14 Lexiscan MPI with fixed basal inferior defect, scar vs attenuation. LVEF 37%, basal inferior hypokinesis.    - no recent SOB/DOE. No recent LE edema - compliant with meds   No sob/doe. No recent edema - compliant with meds  2. Permanent afib   - mild palpitations at times. He reports increased stress as the trigger - no bleeding on coumadin.   - denies any palpitations - no bleeding on coumadin. DOACs have been to expensive.    3. Carotid stenosis  - followed by vascular, history of LICA chronic occlusion  - no recent symptoms. Compliant with meds  - upcoming follow up.  - on ASA with coumadin due to carotid disease  4. Hyperlipidemia  - has labs next week with pcp - compliant with statin    5. OSA screen  - + snore, + obesity. Denies somnolence. + afib, + chf - he has not scheduled his sleep eval   6. CVA - stroke 08/2011, aspirin started at that time. Of not his INR was subtherapeutic at that time.    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 Works at [employer]   Past Medical History:  Diagnosis Date  . Angina   . Atrial fibrillation (HCC)   . Atrial flutter (HCC) 2007   h/o  . Blood dyscrasia   . Coronary artery disease 08/19/11  . Diabetes mellitus    diet controlled  . Gilbert's syndrome   . Hyperlipidemia   . Hypertension   . Peripheral vascular disease (HCC)   . Stroke [facility]) ~08/17/11   "lost vision left eye"  . Vertigo      Allergies  Allergen Reactions  . Other Other (See Comments)    Mayonnaise - irritation in nasal passages if breathe around it   . Penicillins Rash     Current Outpatient Medications  Medication Sig Dispense Refill  . aspirin 81 MG tablet Take 81 mg by mouth daily.    Marland Kitchen  atorvastatin (LIPITOR) 40 MG tablet Take 1 tablet (40 mg total) by mouth daily. 90 tablet 1  . cholecalciferol (VITAMIN D) 1000 units tablet Take 1,000 Units by mouth daily.    . furosemide (LASIX) 20 MG tablet Take 1 tablet (20 mg total) by mouth daily. 30 tablet 6  . lisinopril (PRINIVIL,ZESTRIL) 2.5 MG tablet Take 1 tablet (2.5 mg total) by mouth daily. 90 tablet 0  . metoprolol succinate (TOPROL-XL) 100 MG 24 hr tablet TAKE ONE TABLET BY MOUTH TWICE DAILY WITH  OR  IMMEDIATELY  FOLLOWING  A  MEAL  **  DOSE  INCREASE** 180 tablet 0  . Omega-3 Fatty Acids (FISH OIL) 1200 MG CAPS Take 1 capsule by mouth daily.    Marland Kitchen warfarin (COUMADIN) 10 MG tablet Take 1 tablet daily or as directed by Coumadin clinic 30 tablet 3   No current facility-administered medications for this visit.      Past Surgical History:  Procedure Laterality Date  . ACHILLES TENDON REPAIR  06/2002   Left  . CARDIOVERSION  2007  . CAROTID ANGIOGRAM  08/19/2011   Procedure: CAROTID ANGIOGRAM;  Surgeon: Chuck Hint, MD;  Location: [facility] CATH LAB;  Service: Cardiovascular;;  . CEREBRAL ANGIOGRAM N/A 08/19/2011   Procedure: CEREBRAL ANGIOGRAM;  Surgeon: Chuck Hint, MD;  Location: [facility] CATH LAB;  Service: Cardiovascular;  Laterality: N/A;     Allergies  Allergen Reactions  . Other Other (See Comments)    Mayonnaise - irritation in nasal passages if breathe around it   . Penicillins Rash      Family History  Problem Relation Age of Onset  . Diabetes Mother   . Hyperlipidemia Mother   . Hypertension Mother   . Emphysema Father   . Heart failure Father   . Heart attack Brother   . Aneurysm Sister   . Deep vein thrombosis Sister   . Diabetes Sister   . Heart attack Sister      Social History [name] reports that he quit smoking about 7 years ago. His smoking use included cigars. He started smoking about 22 years ago. He has a 0.10 pack-year smoking history. He has never used smokeless  tobacco. [name] reports current alcohol use of about 3.0 standard drinks of alcohol per week.   Review of Systems CONSTITUTIONAL: No weight loss, fever, chills, weakness or fatigue.  HEENT: Eyes: No visual loss, blurred vision, double vision or yellow sclerae.No hearing loss, sneezing, congestion, runny nose or sore throat.  SKIN: No rash or itching.  CARDIOVASCULAR: per hpi RESPIRATORY: No shortness of breath, cough or sputum.  GASTROINTESTINAL: No anorexia, nausea, vomiting or diarrhea. No abdominal pain or blood.  GENITOURINARY: No burning on urination, no polyuria NEUROLOGICAL: No headache, dizziness, syncope, paralysis, ataxia, numbness or tingling in the extremities. No change in bowel or bladder control.  MUSCULOSKELETAL: No muscle, back pain, joint pain or stiffness.  LYMPHATICS: No enlarged nodes. No history of splenectomy.  PSYCHIATRIC: No history of depression or anxiety.  ENDOCRINOLOGIC: No reports of sweating, cold or heat intolerance. No polyuria or polydipsia.  Marland Kitchen   Physical Examination Vitals:   11/02/18 0927  BP: 119/73  Pulse: 78  SpO2: 97%   Vitals:   11/02/18 0927  Weight: 274 lb (124.3 kg)  Height: 6\' 1"  (1.854 m)    Gen: resting comfortably, no acute distress HEENT: no scleral icterus, pupils equal round and reactive, no palptable cervical adenopathy,  CV: irreg, no m/r/g, no jvd Resp: Clear to auscultation bilaterally GI: abdomen is soft, non-tender, non-distended, normal bowel sounds, no hepatosplenomegaly MSK: extremities are warm, no edema.  Skin: warm, no rash Neuro:  no focal deficits Psych: appropriate affect   Diagnostic Studies  03/2013 Echo Study Conclusions  - Left ventricle: Technically difficult study. There was mild concentric hypertrophy. Systolic function was mildly to moderately reduced. The estimated ejection fraction was in the range of 40% to 45%. Diffuse hypokinesis. The study is not technically sufficient to allow  evaluation of LV diastolic function. - Mitral valve: Mildly thickened leaflets . - Left atrium: Moderately dilated. - Right atrium: The atrium was mildly dilated. 02/2014 Lexiscan MPI Analysis of the raw data did not demonstrate any significant  extracardiac radiotracer uptake. Analysis of the perfusion images  demonstrated a moderate size, moderately intense, basilar inferior  wall defect. Images were worse on rest than stress. Inferior soft  tissue attenuation also noted. Left ventricular systolic function  was moderately reduced, calculated LV EF 37%. The aforementioned  wall is hypokinetic.  IMPRESSION:  1. Abnormal exercise Cardiolite stress test.  2. Rapid atrial fibrillation noted with exercise.  3. Fixed basal inferior wall defect. While this may represent  myocardial scar, interfering soft tissue attenuation cannot entirely  be excluded.  4. Moderately reduced LV  systolic function, calculated LVEF 37%.  07/2015 echo Study Conclusions  - Left ventricle: The cavity size was normal. Wall thickness was normal. Systolic function was mildly reduced. The estimated ejection fraction was in the range of 45% to 50%. Wall motion was normal; there were no regional wall motion abnormalities. - Aortic valve: Valve area (VTI): 2.39 cm^2. Valve area (Vmax): 2.29 cm^2. - Mitral valve: There was mild regurgitation. - Left atrium: The atrium was severely dilated. - Right atrium: The atrium was severely dilated. - Atrial septum: No defect or patent foramen ovale was identified. - Technically adequate study.   11/2015 Carotid US IMPRESSION: Complete occlusion of the left internal carotid artery near its origin.  Moderate eccentric calcified plaque is noted in the right carotid bulb and proximal right internal carotid artery consistent with 50-69% stenosis based on ultrasound and Doppler criteria.     Assessment and Plan   1. Chronic systolic heart  failure  - euvolemic without symptoms, continue current meds  2. Afib  -no symptoms, continue current meds - has been on coumadin with ASA since 2012 when admitted with CVA while on coumadin - EKG today shows rate controlled afib  3. Carotid stenosis  - repeat carotid US, continue medical therapy  4. Hyperlipidemia  - request labs from pcp - continue statin   5. HTN - at goal, continue current meds    Antoine Poche, M.D.

## 2018-11-02 NOTE — Patient Instructions (Signed)
Medication Instructions:  Continue all current medications.  Testing/Procedures:  Your physician has requested that you have a carotid duplex. This test is an ultrasound of the carotid arteries in your neck. It looks at blood flow through these arteries that supply the brain with blood. Allow one hour for this exam. There are no restrictions or special instructions.  Office will contact with results via phone or letter.    Follow-Up: Your physician wants you to follow up in: 6 months.  You will receive a reminder letter in the mail one-two months in advance.  If you don't receive a letter, please call our office to schedule the follow up appointment   Any Other Special Instructions Will Be Listed Below (If Applicable).  If you need a refill on your cardiac medications before your next appointment, please call your pharmacy.

## 2018-11-22 ENCOUNTER — Telehealth: Payer: Self-pay | Admitting: *Deleted

## 2018-11-22 NOTE — Telephone Encounter (Signed)
1. Have you recently travelled abroad or to [place], [place], or [place]?  2. Do you currently have a fever?  3. Have you been in contact with someone that is currently pending confirmation of COVID19 testing or has been confirmed to have the COVID19 virus?  4. Are you currently experiencing fatigue or cough?  5. Are you currently experiencing new or worsening shortness of breath at rest or with minimal activity?  6. Have you been in contact with someone that was recently sick with fever/cough/fatigue?    **A score of 4 or more should result in cancellation of the pts cardiology appt  **A score of 2 should be provided a mask prior to admission into the lobby  **TRAVEL to a high risk area or contact with a confirmed case should stay at home, away from confirmed patient, monitor symptoms, and reach out to PCP for evisit, additional testing.   **ALL PTS WITH FEVER SHOULD BE REFERRED TO PCP FOR EVISIT  Pt. Advised that we are restricting visitors at this time and request that only patients present for check-in prior to their appointment. All other visitors should remain in their car. If necessary, only one visitor may come with the patient into the building. For everyone's safety, all patients and visitors entering our practice area should expect to be screened again prior to entering our waiting area.    LEFT MESSAGE FOR PT WITH ABOVE MESSAGE AND FOR PT TO CALL BACK TO CONFIRM OR RESCHEDULE APPOINTMENT.

## 2018-12-12 ENCOUNTER — Telehealth: Payer: Self-pay | Admitting: Cardiology

## 2018-12-12 NOTE — Telephone Encounter (Signed)
° ° ° °  COVID-19 Pre-Screening Questions: ° °• Do you currently have a fever? No °•  °• Have you recently travelled on a cruise, internationally, or to [place], [place], [place], [place], [place], or [place], [place] ([place]) ? No °•  °• Have you been in contact with someone that is currently pending confirmation of Covid19 testing or has been confirmed to have the Covid19 virus? No °•  °• Are you currently experiencing fatigue or cough? No  ° ° °   ° ° ° ° ° °

## 2018-12-13 ENCOUNTER — Ambulatory Visit (INDEPENDENT_AMBULATORY_CARE_PROVIDER_SITE_OTHER): Payer: Medicare HMO | Admitting: *Deleted

## 2018-12-13 DIAGNOSIS — Z5181 Encounter for therapeutic drug level monitoring: Secondary | ICD-10-CM

## 2018-12-13 DIAGNOSIS — I4891 Unspecified atrial fibrillation: Secondary | ICD-10-CM | POA: Diagnosis not present

## 2018-12-13 LAB — POCT INR: INR: 2.3 (ref 2.0–3.0)

## 2018-12-13 NOTE — Patient Instructions (Signed)
Continue 1 tablet daily (10mg).  Recheck in 6 weeks 

## 2018-12-20 ENCOUNTER — Telehealth: Payer: Self-pay | Admitting: Cardiology

## 2018-12-20 NOTE — Telephone Encounter (Signed)
°  Precert needed for: Carotid   Location: [facility]   Date: February 07, 2019

## 2019-01-26 ENCOUNTER — Other Ambulatory Visit: Payer: Self-pay | Admitting: Cardiology

## 2019-02-06 ENCOUNTER — Telehealth: Payer: Self-pay | Admitting: Cardiology

## 2019-02-06 NOTE — Telephone Encounter (Signed)

## 2019-02-07 ENCOUNTER — Other Ambulatory Visit: Payer: Self-pay | Admitting: Cardiology

## 2019-02-07 ENCOUNTER — Ambulatory Visit (INDEPENDENT_AMBULATORY_CARE_PROVIDER_SITE_OTHER): Payer: Medicare HMO

## 2019-02-07 DIAGNOSIS — I6529 Occlusion and stenosis of unspecified carotid artery: Secondary | ICD-10-CM

## 2019-02-19 ENCOUNTER — Telehealth: Payer: Self-pay | Admitting: *Deleted

## 2019-02-19 NOTE — Telephone Encounter (Signed)
LM  To return call  

## 2019-02-19 NOTE — Telephone Encounter (Signed)
-----   Message from Arnoldo Lenis, MD sent at 02/18/2019  3:27 PM EDT ----- Carotid US shows moderate blockage on the right, we will continue to monitor  Zandra Abts MD

## 2019-03-05 ENCOUNTER — Encounter: Payer: Self-pay | Admitting: *Deleted

## 2019-03-05 ENCOUNTER — Telehealth: Payer: Self-pay | Admitting: *Deleted

## 2019-03-05 NOTE — Telephone Encounter (Signed)
Mailed results letter - LM X 4 no return call   Carotid US shows moderate blockage on the right, we will continue to monitor   Zandra Abts MD

## 2019-03-15 NOTE — Telephone Encounter (Signed)
Left multiple messages to return call - mailed results letter

## 2019-03-29 ENCOUNTER — Other Ambulatory Visit: Payer: Self-pay | Admitting: Cardiology

## 2019-03-29 NOTE — Telephone Encounter (Signed)
Prescription refill request received for Coumadin. Pt is overdue for an appointment. Attempted to call pt, however pt's voice mailbox is not set up.

## 2019-04-02 ENCOUNTER — Other Ambulatory Visit: Payer: Self-pay | Admitting: *Deleted

## 2019-04-02 MED ORDER — FUROSEMIDE 20 MG PO TABS: 20.0000 mg | ORAL_TABLET | Freq: Every day | ORAL | 2 refills | Status: DC

## 2019-05-07 ENCOUNTER — Other Ambulatory Visit: Payer: Self-pay | Admitting: *Deleted

## 2019-05-07 MED ORDER — WARFARIN SODIUM 10 MG PO TABS: ORAL_TABLET | ORAL | 0 refills | Status: DC

## 2019-05-08 ENCOUNTER — Encounter: Payer: Self-pay | Admitting: Cardiology

## 2019-05-08 ENCOUNTER — Other Ambulatory Visit: Payer: Self-pay

## 2019-05-08 ENCOUNTER — Ambulatory Visit (INDEPENDENT_AMBULATORY_CARE_PROVIDER_SITE_OTHER): Payer: Medicare HMO | Admitting: Cardiology

## 2019-05-08 ENCOUNTER — Encounter: Payer: Self-pay | Admitting: *Deleted

## 2019-05-08 VITALS — BP 125/64 | HR 83 | Temp 98.1°F | Ht 73.0 in | Wt 272.6 lb

## 2019-05-08 DIAGNOSIS — I5022 Chronic systolic (congestive) heart failure: Secondary | ICD-10-CM

## 2019-05-08 DIAGNOSIS — I4891 Unspecified atrial fibrillation: Secondary | ICD-10-CM | POA: Diagnosis not present

## 2019-05-08 DIAGNOSIS — I6529 Occlusion and stenosis of unspecified carotid artery: Secondary | ICD-10-CM | POA: Diagnosis not present

## 2019-05-08 DIAGNOSIS — I10 Essential (primary) hypertension: Secondary | ICD-10-CM

## 2019-05-08 DIAGNOSIS — E782 Mixed hyperlipidemia: Secondary | ICD-10-CM

## 2019-05-08 NOTE — Patient Instructions (Addendum)

## 2019-05-08 NOTE — Progress Notes (Signed)
Clinical Summary Mr. Croft is a 63 y.o.male seen today for follow up of the following medical problems.   1. Chronic systolic heart failure  - 03/2013 LVEF 40-45%.  - repeat echo 07/2015 LVEF 45-50%, no WMA  - 02/25/14 Lexiscan MPI with fixed basal inferior defect, scar vs attenuation. LVEF 37%, basal inferior hypokinesis.     - no recent SOb/DOE. No recent edema - compliant with meds    2. Permanent afib  - DOACs have been to expensive.   - no recent bleeding on coumadin - no recent palpitations.   3. Carotid stenosis  - followed by vascular, history of LICA chronic occlusion - 04/1190 [place] LICA occluded, RICA moderate  - no recent neuro symptoms  4. Hyperlipidemia - compliant with statin, labs followed by pcp    5. OSA screen  - + snore, + obesity. Denies somnolence. + afib, + chf - he has not scheduled his sleep eval   6. CVA - stroke 08/2011, aspirin started at that time. Of not his INR was subtherapeutic at that time.    SH: son working on Oceanographer at [employer]. Works at [employer]   Past Medical History:  Diagnosis Date  . Angina   . Atrial fibrillation ([place])   . Atrial flutter ([place]) 2007   h/o  . Blood dyscrasia   . Coronary artery disease 08/19/11  . Diabetes mellitus    diet controlled  . Gilbert's syndrome   . Hyperlipidemia   . Hypertension   . Peripheral vascular disease ([place])   . Stroke [facility]) ~08/17/11   "lost vision left eye"  . Vertigo      Allergies  Allergen Reactions  . Other Other (See Comments)    Mayonnaise - irritation in nasal passages if breathe around it   . Penicillins Rash     Current Outpatient Medications  Medication Sig Dispense Refill  . aspirin 81 MG tablet Take 81 mg by mouth daily.    Marland Kitchen atorvastatin (LIPITOR) 40 MG tablet Take 1 tablet (40 mg total) by mouth daily. 90 tablet 1  . cholecalciferol (VITAMIN D) 1000 units tablet Take 1,000 Units by mouth daily.    . furosemide (LASIX) 20 MG tablet Take  1 tablet (20 mg total) by mouth daily. 30 tablet 2  . lisinopril (ZESTRIL) 2.5 MG tablet Take 1 tablet by mouth once daily 90 tablet 2  . metoprolol succinate (TOPROL-XL) 100 MG 24 hr tablet TAKE ONE TABLET BY MOUTH TWICE DAILY WITH  OR  IMMEDIATELY  FOLLOWING  A  MEAL  **  DOSE  INCREASE** 180 tablet 0  . Omega-3 Fatty Acids (FISH OIL) 1200 MG CAPS Take 1 capsule by mouth daily.    Marland Kitchen warfarin (COUMADIN) 10 MG tablet TAKE 1 TABLET BY MOUTH ONCE DAILY AS DIRECTED BY  COUMADIN  CLINIC 15 tablet 0   No current facility-administered medications for this visit.      Past Surgical History:  Procedure Laterality Date  . ACHILLES TENDON REPAIR  06/2002   Left  . CARDIOVERSION  2007  . CAROTID ANGIOGRAM  08/19/2011   Procedure: CAROTID ANGIOGRAM;  Surgeon: Angelia Mould, MD;  Location[facility] CATH LAB;  Service: Cardiovascular;;  . CEREBRAL ANGIOGRAM N/A 08/19/2011   Procedure: CEREBRAL ANGIOGRAM;  Surgeon: Angelia Mould, MD;  Location: [facility] CATH LAB;  Service: Cardiovascular;  Laterality: N/A;     Allergies  Allergen Reactions  . Other Other (See Comments)    Mayonnaise - irritation in  nasal passages if breathe around it   . Penicillins Rash      Family History  Problem Relation Age of Onset  . Diabetes Mother   . Hyperlipidemia Mother   . Hypertension Mother   . Emphysema Father   . Heart failure Father   . Heart attack Brother   . Aneurysm Sister   . Deep vein thrombosis Sister   . Diabetes Sister   . Heart attack Sister      Social History Mr. Swaringen reports that he quit smoking about 7 years ago. His smoking use included cigars. He started smoking about 23 years ago. He has a 0.10 pack-year smoking history. He has never used smokeless tobacco. Mr. Mcquown reports current alcohol use of about 3.0 standard drinks of alcohol per week.   Review of Systems CONSTITUTIONAL: No weight loss, fever, chills, weakness or fatigue.  HEENT: Eyes: No visual loss, blurred  vision, double vision or yellow sclerae.No hearing loss, sneezing, congestion, runny nose or sore throat.  SKIN: No rash or itching.  CARDIOVASCULAR: per hpi RESPIRATORY: No shortness of breath, cough or sputum.  GASTROINTESTINAL: No anorexia, nausea, vomiting or diarrhea. No abdominal pain or blood.  GENITOURINARY: No burning on urination, no polyuria NEUROLOGICAL: No headache, dizziness, syncope, paralysis, ataxia, numbness or tingling in the extremities. No change in bowel or bladder control.  MUSCULOSKELETAL: No muscle, back pain, joint pain or stiffness.  LYMPHATICS: No enlarged nodes. No history of splenectomy.  PSYCHIATRIC: No history of depression or anxiety.  ENDOCRINOLOGIC: No reports of sweating, cold or heat intolerance. No polyuria or polydipsia.  Marland Kitchen   Physical Examination Vitals:   05/08/19 1440  BP: 125/64  Pulse: 83  Temp: 98.1 F (36.7 C)  SpO2: 98%   Filed Weights   05/08/19 1440  Weight: 272 lb 9.6 oz (123.7 kg)    Gen: resting comfortably, no acute distress HEENT: no scleral icterus, pupils equal round and reactive, no palptable cervical adenopathy,  CV: irreg, no m/r/g, no jvd Resp: Clear to auscultation bilaterally GI: abdomen is soft, non-tender, non-distended, normal bowel sounds, no hepatosplenomegaly MSK: extremities are warm, no edema.  Skin: warm, no rash Neuro:  no focal deficits Psych: appropriate affect   Diagnostic Studies 03/2013 Echo Study Conclusions  - Left ventricle: Technically difficult study. There was mild concentric hypertrophy. Systolic function was mildly to moderately reduced. The estimated ejection fraction was in the range of 40% to 45%. Diffuse hypokinesis. The study is not technically sufficient to allow evaluation of LV diastolic function. - Mitral valve: Mildly thickened leaflets . - Left atrium: Moderately dilated. - Right atrium: The atrium was mildly dilated. 02/2014 Lexiscan MPI Analysis of the raw data did  not demonstrate any significant  extracardiac radiotracer uptake. Analysis of the perfusion images  demonstrated a moderate size, moderately intense, basilar inferior  wall defect. Images were worse on rest than stress. Inferior soft  tissue attenuation also noted. Left ventricular systolic function  was moderately reduced, calculated LV EF 37%. The aforementioned  wall is hypokinetic.  IMPRESSION:  1. Abnormal exercise Cardiolite stress test.  2. Rapid atrial fibrillation noted with exercise.  3. Fixed basal inferior wall defect. While this may represent  myocardial scar, interfering soft tissue attenuation cannot entirely  be excluded.  4. Moderately reduced LV systolic function, calculated LVEF 37%.  07/2015 echo Study Conclusions  - Left ventricle: The cavity size was normal. Wall thickness was normal. Systolic function was mildly reduced. The estimated ejection fraction was in the  range of 45% to 50%. Wall motion was normal; there were no regional wall motion abnormalities. - Aortic valve: Valve area (VTI): 2.39 cm^2. Valve area (Vmax): 2.29 cm^2. - Mitral valve: There was mild regurgitation. - Left atrium: The atrium was severely dilated. - Right atrium: The atrium was severely dilated. - Atrial septum: No defect or patent foramen ovale was identified. - Technically adequate study.   11/2015 Carotid US IMPRESSION: Complete occlusion of the left internal carotid artery near its origin.  Moderate eccentric calcified plaque is noted in the right carotid bulb and proximal right internal carotid artery consistent with 50-69% stenosis based on ultrasound and Doppler criteria.    Assessment and Plan  1. Chronic systolic heart failure  -- no recent symptoms, continue current meds  2. Afib  - has been on coumadin with ASA since 2012 when admitted with CVA while on coumadin - no symptoms, continue current meds  3. Carotid stenosis  -  continue medical therapy, continue to monitor.   4. Hyperlipidemia  - continue statin, request labs from pcp   5. HTN - at goal, continue current meds  F/u 6 months, request labs from pcp      Antoine PocheJonathan F. , M.D

## 2019-05-20 ENCOUNTER — Ambulatory Visit (INDEPENDENT_AMBULATORY_CARE_PROVIDER_SITE_OTHER): Payer: Medicare HMO | Admitting: *Deleted

## 2019-05-20 DIAGNOSIS — I4891 Unspecified atrial fibrillation: Secondary | ICD-10-CM | POA: Diagnosis not present

## 2019-05-20 DIAGNOSIS — Z5181 Encounter for therapeutic drug level monitoring: Secondary | ICD-10-CM

## 2019-05-20 LAB — POCT INR: INR: 2.7 (ref 2.0–3.0)

## 2019-05-20 MED ORDER — WARFARIN SODIUM 10 MG PO TABS: ORAL_TABLET | ORAL | 2 refills | Status: DC

## 2019-05-20 NOTE — Patient Instructions (Signed)
Continue 1 tablet daily (10mg ).  Recheck in 6 weeks

## 2019-05-24 ENCOUNTER — Other Ambulatory Visit: Payer: Self-pay | Admitting: Cardiology

## 2019-08-07 ENCOUNTER — Other Ambulatory Visit: Payer: Self-pay | Admitting: Cardiology

## 2019-08-20 ENCOUNTER — Ambulatory Visit (INDEPENDENT_AMBULATORY_CARE_PROVIDER_SITE_OTHER): Payer: Medicare HMO | Admitting: *Deleted

## 2019-08-20 ENCOUNTER — Other Ambulatory Visit: Payer: Self-pay

## 2019-08-20 DIAGNOSIS — Z5181 Encounter for therapeutic drug level monitoring: Secondary | ICD-10-CM

## 2019-08-20 DIAGNOSIS — I4891 Unspecified atrial fibrillation: Secondary | ICD-10-CM | POA: Diagnosis not present

## 2019-08-20 DIAGNOSIS — I6529 Occlusion and stenosis of unspecified carotid artery: Secondary | ICD-10-CM

## 2019-08-20 LAB — POCT INR: INR: 1.9 — AB (ref 2.0–3.0)

## 2019-08-20 NOTE — Patient Instructions (Signed)
Take warfarin 1 1/2 tablets today then resume 1 tablet daily (10mg ).  Recheck in 6 weeks

## 2019-09-09 ENCOUNTER — Other Ambulatory Visit: Payer: Self-pay | Admitting: Cardiology

## 2019-10-15 ENCOUNTER — Other Ambulatory Visit: Payer: Self-pay | Admitting: *Deleted

## 2019-10-15 MED ORDER — WARFARIN SODIUM 10 MG PO TABS: ORAL_TABLET | ORAL | 1 refills | Status: DC

## 2019-11-12 ENCOUNTER — Ambulatory Visit: Payer: Medicare HMO | Admitting: Cardiology

## 2019-11-20 ENCOUNTER — Other Ambulatory Visit: Payer: Self-pay

## 2019-11-20 ENCOUNTER — Ambulatory Visit (INDEPENDENT_AMBULATORY_CARE_PROVIDER_SITE_OTHER): Payer: Medicare HMO | Admitting: *Deleted

## 2019-11-20 DIAGNOSIS — I4891 Unspecified atrial fibrillation: Secondary | ICD-10-CM | POA: Diagnosis not present

## 2019-11-20 DIAGNOSIS — Z5181 Encounter for therapeutic drug level monitoring: Secondary | ICD-10-CM

## 2019-11-20 LAB — POCT INR: INR: 3.2 — AB (ref 2.0–3.0)

## 2019-11-20 NOTE — Patient Instructions (Signed)
Take warfarin 1/2 tablet tomorrow then resume 1 tablet daily (10mg ).  Recheck in 6 weeks

## 2019-12-19 ENCOUNTER — Other Ambulatory Visit: Payer: Self-pay | Admitting: Cardiology

## 2019-12-20 ENCOUNTER — Ambulatory Visit: Payer: Medicare HMO | Admitting: Cardiology

## 2020-02-19 ENCOUNTER — Ambulatory Visit: Payer: Medicare HMO | Admitting: Cardiology

## 2020-02-19 NOTE — Progress Notes (Deleted)
Clinical Summary Mr. Guderian is a 64 y.o.male  seen today for follow up of the following medical problems.   1. Chronic systolic heart failure  - 03/2013 LVEF 40-45%.  - repeat echo 07/2015 LVEF 45-50%, no WMA  - 02/25/14 Lexiscan MPI with fixed basal inferior defect, scar vs attenuation. LVEF 37%, basal inferior hypokinesis.     - no recent SOb/DOE. No recent edema - compliant with meds    2. Permanent afib  - DOACs have been to expensive.  - no recent bleeding on coumadin - no recent palpitations.   3. Carotid stenosis  - followed by vascular, history of LICA chronic occlusion - 02/2019 [place] LICA occluded, RICA moderate  - no recent neuro symptoms  4. Hyperlipidemia - compliant with statin, labs followed by pcp    5. OSA screen  - + snore, + obesity. Denies somnolence. + afib, + chf - he has not scheduled his sleep eval   6. CVA - stroke 08/2011, aspirin started at that time.Of not his INR was subtherapeutic at that time.   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 Works at [employer]   Past Medical History:  Diagnosis Date  . Angina   . Atrial fibrillation ([facility])   . Atrial flutter ([facility]) 2007   h/o  . Blood dyscrasia   . Coronary artery disease 08/19/11  . Diabetes mellitus    diet controlled  . Gilbert's syndrome   . Hyperlipidemia   . Hypertension   . Peripheral vascular disease ([facility])   . Stroke [facility]) ~08/17/11   "lost vision left eye"  . Vertigo      Allergies  Allergen Reactions  . Other Other (See Comments)    Mayonnaise - irritation in nasal passages if breathe around it   . Penicillins Rash     Current Outpatient Medications  Medication Sig Dispense Refill  . aspirin 81 MG tablet Take 81 mg by mouth daily.    Marland Kitchen atorvastatin (LIPITOR) 40 MG tablet Take 1 tablet by mouth once daily 90 tablet 2  . cholecalciferol (VITAMIN D) 1000 units tablet Take 1,000 Units by mouth daily.    . furosemide (LASIX) 20 MG tablet Take 1  tablet by mouth once daily 90 tablet 2  . lisinopril (ZESTRIL) 2.5 MG tablet Take 1 tablet by mouth once daily 90 tablet 0  . metoprolol succinate (TOPROL-XL) 100 MG 24 hr tablet TAKE 1 TABLET BY MOUTH TWICE DAILY WITH OR IMMEDIATELY FOLLOWING A MEAL 180 tablet 1  . Omega-3 Fatty Acids (FISH OIL) 1200 MG CAPS Take 1 capsule by mouth daily.    Marland Kitchen warfarin (COUMADIN) 10 MG tablet Take 1 tablet daily or as directed 30 tablet 1   No current facility-administered medications for this visit.     Past Surgical History:  Procedure Laterality Date  . ACHILLES TENDON REPAIR  06/2002   Left  . CARDIOVERSION  2007  . CAROTID ANGIOGRAM  08/19/2011   Procedure: CAROTID ANGIOGRAM;  Surgeon: Chuck Hint, MD;  Location: [facility] CATH LAB;  Service: Cardiovascular;;  . CEREBRAL ANGIOGRAM N/A 08/19/2011   Procedure: CEREBRAL ANGIOGRAM;  Surgeon: Chuck Hint, MD;  Location: [facility] CATH LAB;  Service: Cardiovascular;  Laterality: N/A;     Allergies  Allergen Reactions  . Other Other (See Comments)    Mayonnaise - irritation in nasal passages if breathe around it   . Penicillins Rash      Family History  Problem Relation Age of Onset  .  Diabetes Mother   . Hyperlipidemia Mother   . Hypertension Mother   . Emphysema Father   . Heart failure Father   . Heart attack Brother   . Aneurysm Sister   . Deep vein thrombosis Sister   . Diabetes Sister   . Heart attack Sister      Social History Mr. Ormiston reports that he quit smoking about 8 years ago. His smoking use included cigars. He started smoking about 24 years ago. He has a 0.10 pack-year smoking history. He has never used smokeless tobacco. Mr. Lienhard reports current alcohol use of about 3.0 standard drinks of alcohol per week.   Review of Systems CONSTITUTIONAL: No weight loss, fever, chills, weakness or fatigue.  HEENT: Eyes: No visual loss, blurred vision, double vision or yellow sclerae.No hearing loss, sneezing, congestion,  runny nose or sore throat.  SKIN: No rash or itching.  CARDIOVASCULAR:  RESPIRATORY: No shortness of breath, cough or sputum.  GASTROINTESTINAL: No anorexia, nausea, vomiting or diarrhea. No abdominal pain or blood.  GENITOURINARY: No burning on urination, no polyuria NEUROLOGICAL: No headache, dizziness, syncope, paralysis, ataxia, numbness or tingling in the extremities. No change in bowel or bladder control.  MUSCULOSKELETAL: No muscle, back pain, joint pain or stiffness.  LYMPHATICS: No enlarged nodes. No history of splenectomy.  PSYCHIATRIC: No history of depression or anxiety.  ENDOCRINOLOGIC: No reports of sweating, cold or heat intolerance. No polyuria or polydipsia.  Marland Kitchen   Physical Examination There were no vitals filed for this visit. There were no vitals filed for this visit.  Gen: resting comfortably, no acute distress HEENT: no scleral icterus, pupils equal round and reactive, no palptable cervical adenopathy,  CV Resp: Clear to auscultation bilaterally GI: abdomen is soft, non-tender, non-distended, normal bowel sounds, no hepatosplenomegaly MSK: extremities are warm, no edema.  Skin: warm, no rash Neuro:  no focal deficits Psych: appropriate affect   Diagnostic Studies 03/2013 Echo Study Conclusions  - Left ventricle: Technically difficult study. There was mild concentric hypertrophy. Systolic function was mildly to moderately reduced. The estimated ejection fraction was in the range of 40% to 45%. Diffuse hypokinesis. The study is not technically sufficient to allow evaluation of LV diastolic function. - Mitral valve: Mildly thickened leaflets . - Left atrium: Moderately dilated. - Right atrium: The atrium was mildly dilated. 02/2014 Lexiscan MPI Analysis of the raw data did not demonstrate any significant  extracardiac radiotracer uptake. Analysis of the perfusion images  demonstrated a moderate size, moderately intense, basilar inferior  wall  defect. Images were worse on rest than stress. Inferior soft  tissue attenuation also noted. Left ventricular systolic function  was moderately reduced, calculated LV EF 37%. The aforementioned  wall is hypokinetic.  IMPRESSION:  1. Abnormal exercise Cardiolite stress test.  2. Rapid atrial fibrillation noted with exercise.  3. Fixed basal inferior wall defect. While this may represent  myocardial scar, interfering soft tissue attenuation cannot entirely  be excluded.  4. Moderately reduced LV systolic function, calculated LVEF 37%.  07/2015 echo Study Conclusions  - Left ventricle: The cavity size was normal. Wall thickness was normal. Systolic function was mildly reduced. The estimated ejection fraction was in the range of 45% to 50%. Wall motion was normal; there were no regional wall motion abnormalities. - Aortic valve: Valve area (VTI): 2.39 cm^2. Valve area (Vmax): 2.29 cm^2. - Mitral valve: There was mild regurgitation. - Left atrium: The atrium was severely dilated. - Right atrium: The atrium was severely dilated. - Atrial  septum: No defect or patent foramen ovale was identified. - Technically adequate study.   11/2015 Carotid US IMPRESSION: Complete occlusion of the left internal carotid artery near its origin.  Moderate eccentric calcified plaque is noted in the right carotid bulb and proximal right internal carotid artery consistent with 50-69% stenosis based on ultrasound and Doppler criteria.    Assessment and Plan  1. Chronic systolic heart failure  -- no recent symptoms, continue current meds  2. Afib  - has been on coumadin with ASA since 2012 when admitted with CVA while on coumadin - no symptoms, continue current meds  3. Carotid stenosis  - continue medical therapy, continue to monitor.   4. Hyperlipidemia  - continue statin, request labs from pcp   5. HTN - at goal, continue current meds  F/u 6 months,  request labs from pcp      Antoine Poche, M.D.

## 2020-02-21 ENCOUNTER — Ambulatory Visit: Payer: Medicare HMO | Admitting: Cardiology

## 2020-02-24 ENCOUNTER — Telehealth: Payer: Self-pay | Admitting: Cardiology

## 2020-02-24 NOTE — Telephone Encounter (Signed)
[name] patient regarding recall appointment, patient notified our office they did not wish to keep this appointment at this time.  Deleted recall from system.  [name] [[id number]] 05/08/2019 3:00 PM New [10]   [name] [[id number]] 06/28/2019 1:47 PM Scheduled/Linked [30]   [name] [[id number]] 11/11/2019 9:45 AM New [10]   [name] [[id number]] 11/11/2019 9:45 AM Scheduled/Linked [30]   [name] [[id number]] 12/19/2019 11:02 AM New [10]   [name] [[id number]] 12/19/2019 11:02 AM Scheduled/Linked [30]   [name] [[id number]] 01/03/2020 11:52 AM New [10]   [name] [[id number]] 01/03/2020 11:52 AM Scheduled/Linked [30]   [System]

## 2020-03-22 ENCOUNTER — Other Ambulatory Visit: Payer: Self-pay | Admitting: Cardiology

## 2020-04-21 ENCOUNTER — Ambulatory Visit: Payer: Medicare HMO | Admitting: Family Medicine

## 2020-04-24 ENCOUNTER — Ambulatory Visit: Payer: Medicare HMO | Admitting: Family Medicine

## 2020-04-29 NOTE — Progress Notes (Signed)
Cardiology Office Note  Date: 04/30/2020   ID: [name], DOB 1955-12-31, MRN [id number]  PCP:  Tanna Furry, MD  Cardiologist:  Dina Rich, MD Electrophysiologist:  None   Chief Complaint: Follow-up chronic systolic heart failure.  History of Present Illness: [name] is a 64 y.o. male with a history of chronic systolic heart failure, permanent atrial fib, carotid stenosis, HLD, OSA, CVA.  Last saw Dr. Wyline Mood from 05/08/2019.  His chronic systolic heart failure was symptomatically stable.  No recent shortness of breath or DOE.  No recent edema.  He was compliant with medications.  Atrial fibrillation was well controlled.  No recent bleeding on Coumadin.  Is compliant with his statin medications.  He was positive for snoring positive for obesity.  Denied somnolence.  Had not scheduled his sleep evaluation.  Previous CVA in 2012 when his INR was subtherapeutic.  History of L ICA chronic occlusion.  He was followed by vascular.  Ultrasound in 7616 showed LICA occluded.  R ICA had moderate disease.  No recent neuro symptoms.  Here today for 50-month follow-up.  He denies any recent anginal or exertional symptoms, palpitations or arrhythmias, orthostatic symptoms, CVA or TIA symptoms.  No PND, orthopnea, bleeding, claudication, DVT or PE-like symptoms, lower extremity edema.  States has been doing very well.  EKG today shows atrial fibrillation with controlled rate of 80.  He goes to the Coumadin clinic for management of his warfarin.  His blood pressure is well controlled today.  Blood pressure 114/80.   Past Medical History:  Diagnosis Date  . Angina   . Atrial fibrillation (HCC)   . Atrial flutter (HCC) 2007   h/o  . Blood dyscrasia   . Coronary artery disease 08/19/11  . Diabetes mellitus    diet controlled  . Gilbert's syndrome   . Hyperlipidemia   . Hypertension   . Peripheral vascular disease (HCC)   . Stroke [facility]) ~08/17/11   "lost vision left eye"   . Vertigo     Past Surgical History:  Procedure Laterality Date  . ACHILLES TENDON REPAIR  06/2002   Left  . CARDIOVERSION  2007  . CAROTID ANGIOGRAM  08/19/2011   Procedure: CAROTID ANGIOGRAM;  Surgeon: Chuck Hint, MD;  Location: [facility] CATH LAB;  Service: Cardiovascular;;  . CEREBRAL ANGIOGRAM N/A 08/19/2011   Procedure: CEREBRAL ANGIOGRAM;  Surgeon: Chuck Hint, MD;  Location: [facility] CATH LAB;  Service: Cardiovascular;  Laterality: N/A;    Current Outpatient Medications  Medication Sig Dispense Refill  . atorvastatin (LIPITOR) 40 MG tablet Take 1 tablet by mouth once daily 90 tablet 2  . Cholecalciferol (VITAMIN D3) 125 MCG (5000 UT) CAPS Take 1 capsule by mouth daily.    . furosemide (LASIX) 20 MG tablet Take 1 tablet by mouth once daily 90 tablet 2  . lisinopril (ZESTRIL) 2.5 MG tablet Take 1 tablet by mouth once daily 90 tablet 0  . metoprolol succinate (TOPROL-XL) 100 MG 24 hr tablet TAKE 1 TABLET BY MOUTH TWICE DAILY WITH OR IMMEDIATELY FOLLOWING A MEAL 180 tablet 1  . Omega-3 Fatty Acids (FISH OIL) 1000 MG CAPS Take 1 capsule by mouth daily.    Marland Kitchen warfarin (COUMADIN) 10 MG tablet Take 1 tablet daily or as directed 30 tablet 3   No current facility-administered medications for this visit.   Allergies:  Other and Penicillins   Social History: The patient  reports that he quit smoking about 8 years ago. His smoking  use included cigars. He started smoking about 24 years ago. He has a 0.10 pack-year smoking history. He has never used smokeless tobacco. He reports current alcohol use of about 3.0 standard drinks of alcohol per week. He reports that he does not use drugs.   Family History: The patient's family history includes Aneurysm in his sister; Deep vein thrombosis in his sister; Diabetes in his mother and sister; Emphysema in his father; Heart attack in his brother and sister; Heart failure in his father; Hyperlipidemia in his mother; Hypertension in his mother.    ROS:  Please see the history of present illness. Otherwise, complete review of systems is positive for none.  All other systems are reviewed and negative.   Physical Exam: VS:  BP 114/80   Pulse 98   Ht 6\' 1"  (1.854 m)   Wt 271 lb 9.6 oz (123.2 kg)   SpO2 97%   BMI 35.83 kg/m , BMI Body mass index is 35.83 kg/m.  Wt Readings from Last 3 Encounters:  04/30/20 271 lb 9.6 oz (123.2 kg)  05/08/19 272 lb 9.6 oz (123.7 kg)  11/02/18 274 lb (124.3 kg)    General: Patient appears comfortable at rest.  Neck: Supple, no elevated JVP or carotid bruits, no thyromegaly. Lungs: Clear to auscultation, nonlabored breathing at rest. Cardiac: Irregularly irregular and rhythm, no S3 or significant systolic murmur, no pericardial rub. Extremities: No pitting edema, distal pulses 2+. Skin: Warm and dry. Musculoskeletal: No kyphosis. Neuropsychiatric: Alert and oriented x3, affect grossly appropriate.  ECG:  An ECG dated 04/30/2020 was personally reviewed today and demonstrated:  Atrial fibrillation with a rate of 80.  Recent Labwork: No results found for requested labs within last 8760 hours.     Component Value Date/Time   CHOL 188 10/27/2011 1015   TRIG 81 10/27/2011 1015   HDL 37 (L) 10/27/2011 1015   CHOLHDL 5.1 10/27/2011 1015   VLDL 16 10/27/2011 1015   LDLCALC 135 (H) 10/27/2011 1015    Other Studies Reviewed Today: Diagnostic Studies 03/2013 Echo Study Conclusions  - Left ventricle: Technically difficult study. There was mild concentric hypertrophy. Systolic function was mildly to moderately reduced. The estimated ejection fraction was in the range of 40% to 45%. Diffuse hypokinesis. The study is not technically sufficient to allow evaluation of LV diastolic function. - Mitral valve: Mildly thickened leaflets . - Left atrium: Moderately dilated. - Right atrium: The atrium was mildly dilated. 02/2014 Lexiscan MPI Analysis of the raw data did not demonstrate any  significant  extracardiac radiotracer uptake. Analysis of the perfusion images  demonstrated a moderate size, moderately intense, basilar inferior  wall defect. Images were worse on rest than stress. Inferior soft  tissue attenuation also noted. Left ventricular systolic function  was moderately reduced, calculated LV EF 37%. The aforementioned  wall is hypokinetic.  IMPRESSION:  1. Abnormal exercise Cardiolite stress test.  2. Rapid atrial fibrillation noted with exercise.  3. Fixed basal inferior wall defect. While this may represent  myocardial scar, interfering soft tissue attenuation cannot entirely  be excluded.  4. Moderately reduced LV systolic function, calculated LVEF 37%.  07/2015 echo Study Conclusions  - Left ventricle: The cavity size was normal. Wall thickness was normal. Systolic function was mildly reduced. The estimated ejection fraction was in the range of 45% to 50%. Wall motion was normal; there were no regional wall motion abnormalities. - Aortic valve: Valve area (VTI): 2.39 cm^2. Valve area (Vmax): 2.29 cm^2. - Mitral valve: There was  mild regurgitation. - Left atrium: The atrium was severely dilated. - Right atrium: The atrium was severely dilated. - Atrial septum: No defect or patent foramen ovale was identified. - Technically adequate study.   11/2015 Carotid US IMPRESSION: Complete occlusion of the left internal carotid artery near its origin.  Moderate eccentric calcified plaque is noted in the right carotid bulb and proximal right internal carotid artery consistent with 50-69% stenosis based on ultrasound and Doppler criteria.    Assessment and Plan:  1. Chronic systolic heart failure (HCC)   2. Permanent atrial fibrillation (HCC)   3. Carotid occlusion, left   4. Mixed hyperlipidemia   5. Essential hypertension    1. Chronic systolic heart failure (HCC) Denies any dyspnea on exertion, weight gain, or lower  extremity edema.  Last echo in 2016 showed EF of 45 to 50%, mild MR, severe LA dilation, severe RA dilation.  Continue furosemide 20 mg p.o. daily.  2. Permanent atrial fibrillation (HCC) Atrial fibrillation rate is well controlled.  EKG today shows atrial fibrillation with a rate of 80.  Continue Toprol-XL 100 mg p.o. twice daily.  Continue Coumadin 10 mg daily.  Continue to follow with Coumadin clinic  3. Carotid occlusion, left Status post left internal carotid artery occlusion, moderate calcified plaque in right internal carotid artery consistent with 50 to 69% stenosis  4. Mixed hyperlipidemia Continue atorvastatin 40 mg daily.Last lipid panel 02-2019 showed total cholesterol 141, HDL 39, triglycerides 65, LDL 87.  5. Essential hypertension Blood pressure well controlled on current therapy.  Continue lisinopril 2.5 mg daily.  Medication Adjustments/Labs and Tests Ordered: Current medicines are reviewed at length with the patient today.  Concerns regarding medicines are outlined above.   Disposition: Follow-up with Dr. Wyline Mood or APP 6 months.  Signed, Rennis Harding, NP 04/30/2020 11:16 AM    [facility] at [facility] [address] [place], [place], [place] [address] Phone: [phone]; Fax: [phone]

## 2020-04-30 ENCOUNTER — Encounter: Payer: Self-pay | Admitting: Family Medicine

## 2020-04-30 ENCOUNTER — Ambulatory Visit (INDEPENDENT_AMBULATORY_CARE_PROVIDER_SITE_OTHER): Payer: Medicare HMO | Admitting: *Deleted

## 2020-04-30 ENCOUNTER — Ambulatory Visit (INDEPENDENT_AMBULATORY_CARE_PROVIDER_SITE_OTHER): Payer: Medicare HMO | Admitting: Family Medicine

## 2020-04-30 VITALS — BP 114/80 | HR 98 | Ht 73.0 in | Wt 271.6 lb

## 2020-04-30 DIAGNOSIS — I4891 Unspecified atrial fibrillation: Secondary | ICD-10-CM

## 2020-04-30 DIAGNOSIS — I10 Essential (primary) hypertension: Secondary | ICD-10-CM

## 2020-04-30 DIAGNOSIS — I6522 Occlusion and stenosis of left carotid artery: Secondary | ICD-10-CM | POA: Diagnosis not present

## 2020-04-30 DIAGNOSIS — E782 Mixed hyperlipidemia: Secondary | ICD-10-CM | POA: Diagnosis not present

## 2020-04-30 DIAGNOSIS — I5022 Chronic systolic (congestive) heart failure: Secondary | ICD-10-CM

## 2020-04-30 DIAGNOSIS — Z5181 Encounter for therapeutic drug level monitoring: Secondary | ICD-10-CM | POA: Diagnosis not present

## 2020-04-30 DIAGNOSIS — I4821 Permanent atrial fibrillation: Secondary | ICD-10-CM

## 2020-04-30 LAB — POCT INR: INR: 1.2 — AB (ref 2.0–3.0)

## 2020-04-30 MED ORDER — WARFARIN SODIUM 10 MG PO TABS: ORAL_TABLET | ORAL | 3 refills | Status: DC

## 2020-04-30 NOTE — Patient Instructions (Signed)
Take warfarin 1 1/2 tablet tonight and tomorrow night then resume 1 tablet daily (10mg ).  Recheck in 3 weeks

## 2020-04-30 NOTE — Patient Instructions (Signed)
Medication Instructions:  Continue all current medications.   Labwork: none  Testing/Procedures: none  Follow-Up: 6 months   Any Other Special Instructions Will Be Listed Below (If Applicable).   If you need a refill on your cardiac medications before your next appointment, please call your pharmacy.  

## 2020-05-01 NOTE — Addendum Note (Signed)
Addended by: Lesle Chris on: 05/01/2020 11:30 AM   Modules accepted: Orders

## 2020-07-04 ENCOUNTER — Other Ambulatory Visit: Payer: Self-pay | Admitting: Cardiology

## 2020-07-20 ENCOUNTER — Encounter: Payer: Self-pay | Admitting: Internal Medicine

## 2020-07-20 NOTE — Progress Notes (Addendum)
Cardiology Office Note  Date: 07/21/2020   ID: [name], DOB [birth year], MRN [id number]  PCP:  Tanna Furry, MD  Cardiologist:  Dina Rich, MD Electrophysiologist:  None   Chief Complaint: Follow-up chronic systolic heart failure.  History of Present Illness: [name] is a 64 y.o. male with a history of chronic systolic heart failure, permanent atrial fib, carotid stenosis, HLD, OSA, CVA.  Last saw Dr. Wyline Mood from 05/08/2019.  His chronic systolic heart failure was symptomatically stable.  No recent shortness of breath or DOE.  No recent edema.  He was compliant with medications.  Atrial fibrillation was well controlled.  No recent bleeding on Coumadin.  Is compliant with his statin medications.  He was positive for snoring positive for obesity.  Denied somnolence.  Had not scheduled his sleep evaluation.  Previous CVA in 2012 when his INR was subtherapeutic.  History of LICA chronic occlusion.  He was followed by vascular.  Ultrasound in 1761 showed LICA occluded.  RICA had moderate disease.  No recent neuro symptoms.  Was here last visit for 34-month follow-up.  He denied any recent anginal or exertional symptoms, palpitations or arrhythmias, orthostatic symptoms, CVA or TIA symptoms.  No PND, orthopnea, bleeding, claudication, DVT or PE-like symptoms, lower extremity edema.  Stated he had been doing very well.  EKG showed atrial fibrillation with controlled rate of 80.  He was going to the Coumadin clinic for management of his warfarin.  His blood pressure was well controlled.    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  He denies any recent acute illnesses or hospitalizations.  No recent anginal or exertional symptoms, palpitations or arrhythmias although he is in permanent atrial fibrillation.  Denies any orthostatic symptoms, CVA or TIA-like symptoms, PND, orthopnea, bleeding, claudication, DVT or  PE-like symptoms, lower extremity edema.  He states his PCP has switched him from Coumadin to Eliquis recently.   Past Medical History:  Diagnosis Date  . Angina   . Atrial fibrillation (HCC)   . Atrial flutter (HCC) 2007   h/o  . Blood dyscrasia   . Coronary artery disease 08/19/11  . Diabetes mellitus    diet controlled  . Gilbert's syndrome   . Hyperlipidemia   . Hypertension   . Peripheral vascular disease (HCC)   . Stroke [facility]) ~08/17/11   "lost vision left eye"  . Vertigo     Past Surgical History:  Procedure Laterality Date  . ACHILLES TENDON REPAIR  06/2002   Left  . CARDIOVERSION  2007  . CAROTID ANGIOGRAM  08/19/2011   Procedure: CAROTID ANGIOGRAM;  Surgeon: Chuck Hint, MD;  Location: [facility] CATH LAB;  Service: Cardiovascular;;  . CEREBRAL ANGIOGRAM N/A 08/19/2011   Procedure: CEREBRAL ANGIOGRAM;  Surgeon: Chuck Hint, MD;  Location: [facility] CATH LAB;  Service: Cardiovascular;  Laterality: N/A;    Current Outpatient Medications  Medication Sig Dispense Refill  . atorvastatin (LIPITOR) 40 MG tablet Take 1 tablet by mouth once daily 90 tablet 1  . Cholecalciferol (VITAMIN D3) 125 MCG (5000 UT) CAPS Take 1 capsule by mouth daily.    . furosemide (LASIX) 20 MG tablet Take 1 tablet by mouth once daily 90 tablet 2  . lisinopril (ZESTRIL) 2.5 MG tablet Take 1 tablet by mouth once daily 90 tablet 0  . metoprolol succinate (TOPROL-XL) 100 MG 24 hr tablet TAKE 1 TABLET BY MOUTH TWICE DAILY  WITH OR IMMEDIATELY FOLLOWING A MEAL 180 tablet 1  . Omega-3 Fatty Acids (FISH OIL) 1000 MG CAPS Take 1 capsule by mouth daily.    Marland Kitchen warfarin (COUMADIN) 10 MG tablet Take 1 tablet daily or as directed 30 tablet 3   No current facility-administered medications for this visit.   Allergies:  Other and Penicillins   Social History: The patient  reports that he quit smoking about 8 years ago. His smoking use included cigars. He started smoking about 24 years ago. He has a 0.10  pack-year smoking history. He has never used smokeless tobacco. He reports current alcohol use of about 3.0 standard drinks of alcohol per week. He reports that he does not use drugs.   Family History: The patient's family history includes Aneurysm in his sister; Deep vein thrombosis in his sister; Diabetes in his mother and sister; Emphysema in his father; Heart attack in his brother and sister; Heart failure in his father; Hyperlipidemia in his mother; Hypertension in his mother.   ROS:  Please see the history of present illness. Otherwise, complete review of systems is positive for none.  All other systems are reviewed and negative.   Physical Exam: VS:  BP 130/78   Pulse 68   Ht 6\' 1"  (1.854 m)   Wt 278 lb 12.8 oz (126.5 kg)   SpO2 99%   BMI 36.78 kg/m , BMI Body mass index is 36.78 kg/m.  Wt Readings from Last 3 Encounters:  07/21/20 278 lb 12.8 oz (126.5 kg)  04/30/20 271 lb 9.6 oz (123.2 kg)  05/08/19 272 lb 9.6 oz (123.7 kg)    General: Patient appears comfortable at rest.  Neck: Supple, no elevated JVP or carotid bruits, no thyromegaly. Lungs: Clear to auscultation, nonlabored breathing at rest. Cardiac: Irregularly irregular and rhythm, no S3 or significant systolic murmur, no pericardial rub. Extremities: No pitting edema, distal pulses 2+. Skin: Warm and dry. Musculoskeletal: No kyphosis. Neuropsychiatric: Alert and oriented x3, affect grossly appropriate.  ECG:  An ECG dated 04/30/2020 was personally reviewed today and demonstrated:  Atrial fibrillation with a rate of 80.  Recent Labwork: No results found for requested labs within last 8760 hours.     Component Value Date/Time   CHOL 188 10/27/2011 1015   TRIG 81 10/27/2011 1015   HDL 37 (L) 10/27/2011 1015   CHOLHDL 5.1 10/27/2011 1015   VLDL 16 10/27/2011 1015   LDLCALC 135 (H) 10/27/2011 1015    Other Studies Reviewed Today: Diagnostic Studies 03/2013 Echo Study Conclusions  - Left ventricle:  Technically difficult study. There was mild concentric hypertrophy. Systolic function was mildly to moderately reduced. The estimated ejection fraction was in the range of 40% to 45%. Diffuse hypokinesis. The study is not technically sufficient to allow evaluation of LV diastolic function. - Mitral valve: Mildly thickened leaflets . - Left atrium: Moderately dilated. - Right atrium: The atrium was mildly dilated.  02/2014 Lexiscan MPI Analysis of the raw data did not demonstrate any significant  extracardiac radiotracer uptake. Analysis of the perfusion images  demonstrated a moderate size, moderately intense, basilar inferior  wall defect. Images were worse on rest than stress. Inferior soft  tissue attenuation also noted. Left ventricular systolic function  was moderately reduced, calculated LV EF 37%. The aforementioned  wall is hypokinetic.  IMPRESSION:  1. Abnormal exercise Cardiolite stress test.  2. Rapid atrial fibrillation noted with exercise.  3. Fixed basal inferior wall defect. While this may represent  myocardial scar, interfering soft  tissue attenuation cannot entirely  be excluded.  4. Moderately reduced LV systolic function, calculated LVEF 37%.  07/2015 echo Study Conclusions  - Left ventricle: The cavity size was normal. Wall thickness was normal. Systolic function was mildly reduced. The estimated ejection fraction was in the range of 45% to 50%. Wall motion was normal; there were no regional wall motion abnormalities. - Aortic valve: Valve area (VTI): 2.39 cm^2. Valve area (Vmax): 2.29 cm^2. - Mitral valve: There was mild regurgitation. - Left atrium: The atrium was severely dilated. - Right atrium: The atrium was severely dilated. - Atrial septum: No defect or patent foramen ovale was identified. - Technically adequate study.   11/2015 Carotid US IMPRESSION: Complete occlusion of the left internal carotid artery near  its origin.  Moderate eccentric calcified plaque is noted in the right carotid bulb and proximal right internal carotid artery consistent with 50-69% stenosis based on ultrasound and Doppler criteria.    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 Chronic systolic heart failure (HCC) Denies any dyspnea on exertion, weight gain, or lower extremity edema.  Last echo in 2016 showed EF of 45 to 50%, mild MR, severe LA dilation, severe RA dilation.  Continue furosemide 20 mg p.o. daily.  No current heart failure symptoms.  2. Permanent atrial fibrillation (HCC) Atrial fibrillation rate is well controlled today with a heart rate of 68 continue Toprol-XL 100 mg p.o. twice daily.  Started Eliquis 5 mg po bid by PCP.   3. Carotid occlusion, left Status post left internal carotid artery occlusion, moderate calcified plaque in right internal carotid artery consistent with 50 to 69% stenosis. 2017  4. Mixed hyperlipidemia Continue atorvastatin 40 mg daily.Last lipid panel 02/2019 showed total cholesterol 141, HDL 39, triglycerides 65, LDL 87.  5. Essential hypertension Blood pressure well controlled on current therapy.  Continue lisinopril 2.5 mg daily.  Medication Adjustments/Labs and Tests Ordered: Current medicines are reviewed at length with the patient today.  Concerns regarding medicines are outlined above.   Disposition: Follow-up with Dr. Wyline Mood or APP 6 months.  Signed, Rennis Harding, NP 07/21/2020 9:02 AM    [facility] at [facility] [address] [place], [place], [place] [address] Phone: [phone]; Fax: [phone]

## 2020-07-21 ENCOUNTER — Ambulatory Visit (INDEPENDENT_AMBULATORY_CARE_PROVIDER_SITE_OTHER): Payer: Medicare HMO | Admitting: Family Medicine

## 2020-07-21 ENCOUNTER — Encounter: Payer: Self-pay | Admitting: Family Medicine

## 2020-07-21 VITALS — BP 130/78 | HR 68 | Ht 73.0 in | Wt 278.8 lb

## 2020-07-21 DIAGNOSIS — I5022 Chronic systolic (congestive) heart failure: Secondary | ICD-10-CM | POA: Diagnosis not present

## 2020-07-21 DIAGNOSIS — E782 Mixed hyperlipidemia: Secondary | ICD-10-CM | POA: Diagnosis not present

## 2020-07-21 DIAGNOSIS — I779 Disorder of arteries and arterioles, unspecified: Secondary | ICD-10-CM

## 2020-07-21 DIAGNOSIS — I4821 Permanent atrial fibrillation: Secondary | ICD-10-CM | POA: Diagnosis not present

## 2020-07-21 DIAGNOSIS — I10 Essential (primary) hypertension: Secondary | ICD-10-CM

## 2020-07-21 NOTE — Patient Instructions (Signed)
Medication Instructions:  Continue all current medications.   Labwork: none  Testing/Procedures: none  Follow-Up: 6 months   Any Other Special Instructions Will Be Listed Below (If Applicable).   If you need a refill on your cardiac medications before your next appointment, please call your pharmacy.  

## 2020-08-15 ENCOUNTER — Other Ambulatory Visit: Payer: Self-pay | Admitting: Cardiology

## 2020-08-16 ENCOUNTER — Other Ambulatory Visit: Payer: Self-pay | Admitting: Cardiology

## 2020-09-05 NOTE — Progress Notes (Deleted)
Referring Provider:*** Primary Care Physician:  Zhou-Talbert, Ralene Bathe, MD Primary Gastroenterologist:  Dr. Jena Gauss  No chief complaint on file.   HPI:   [name] is a 65 y.o. male presenting today at the request of Zhou-Talbert, Ralene Bathe, MD for consult colonoscopy. OV due to medications.   History of chronic systolic HF, permanent atrial fibrillation on Eliquis (previously warfarin), carotid stenosis, HLD, OSA, CVA in 2012 when INR was subtherapeutic.   Today:    Reach out to cardiology. Suspect he will need bridging therapy.   Past Medical History:  Diagnosis Date  . Angina   . Atrial fibrillation (HCC)   . Atrial flutter (HCC) 2007   h/o  . Blood dyscrasia   . Coronary artery disease 08/19/11  . Diabetes mellitus    diet controlled  . Gilbert's syndrome   . Hyperlipidemia   . Hypertension   . Peripheral vascular disease (HCC)   . Stroke [facility]) ~08/17/11   "lost vision left eye"  . Vertigo     Past Surgical History:  Procedure Laterality Date  . ACHILLES TENDON REPAIR  06/2002   Left  . CARDIOVERSION  2007  . CAROTID ANGIOGRAM  08/19/2011   Procedure: CAROTID ANGIOGRAM;  Surgeon: Chuck Hint, MD;  Location: [facility] CATH LAB;  Service: Cardiovascular;;  . CEREBRAL ANGIOGRAM N/A 08/19/2011   Procedure: CEREBRAL ANGIOGRAM;  Surgeon: Chuck Hint, MD;  Location: [facility] CATH LAB;  Service: Cardiovascular;  Laterality: N/A;    Current Outpatient Medications  Medication Sig Dispense Refill  . atorvastatin (LIPITOR) 40 MG tablet Take 1 tablet by mouth once daily 90 tablet 1  . Cholecalciferol (VITAMIN D3) 125 MCG (5000 UT) CAPS Take 1 capsule by mouth daily.    . furosemide (LASIX) 20 MG tablet Take 1 tablet by mouth once daily 90 tablet 2  . lisinopril (ZESTRIL) 2.5 MG tablet Take 1 tablet by mouth once daily 90 tablet 2  . metoprolol succinate (TOPROL-XL) 100 MG 24 hr tablet TAKE 1 TABLET BY MOUTH TWICE DAILY WITH OR IMMEDIATELY FOLLOWING A MEAL 180  tablet 2  . Omega-3 Fatty Acids (FISH OIL) 1000 MG CAPS Take 1 capsule by mouth daily.    Marland Kitchen warfarin (COUMADIN) 10 MG tablet Take 1 tablet daily or as directed 30 tablet 3   No current facility-administered medications for this visit.    Allergies as of 09/07/2020 - Review Complete 07/21/2020  Allergen Reaction Noted  . Other Other (See Comments) 08/17/2011  . Penicillins Rash 04/20/2011    Family History  Problem Relation Age of Onset  . Diabetes Mother   . Hyperlipidemia Mother   . Hypertension Mother   . Emphysema Father   . Heart failure Father   . Heart attack Brother   . Aneurysm Sister   . Deep vein thrombosis Sister   . Diabetes Sister   . Heart attack Sister     Social History   Socioeconomic History  . Marital status: Married    Spouse name: Not on file  . Number of children: Not on file  . Years of education: Not on file  . Highest education level: Not on file  Occupational History  . Not on file  Tobacco Use  . Smoking status: Former Smoker    Packs/day: 0.01    Years: 10.00    Pack years: 0.10    Types: Cigars    Start date: 01/04/1996    Quit date: 08/16/2011    Years since quitting: 9.0  .  Smokeless tobacco: Never Used  Vaping Use  . Vaping Use: Never used  Substance and Sexual Activity  . Alcohol use: Yes    Alcohol/week: 3.0 standard drinks    Types: 3 Cans of beer per week  . Drug use: No  . Sexual activity: Yes    Birth control/protection: Condom  Other Topics Concern  . Not on file  Social History Narrative  . Not on file   Social Determinants of Health   Financial Resource Strain: Not on file  Food Insecurity: Not on file  Transportation Needs: Not on file  Physical Activity: Not on file  Stress: Not on file  Social Connections: Not on file  Intimate Partner Violence: Not on file    Review of Systems: Gen: Denies any fever, chills, fatigue, weight loss, lack of appetite.  CV: Denies chest pain, heart palpitations, peripheral  edema, syncope.  Resp: Denies shortness of breath at rest or with exertion. Denies wheezing or cough.  GI: Denies dysphagia or odynophagia. Denies jaundice, hematemesis, fecal incontinence. GU : Denies urinary burning, urinary frequency, urinary hesitancy MS: Denies joint pain, muscle weakness, cramps, or limitation of movement.  Derm: Denies rash, itching, dry skin Psych: Denies depression, anxiety, memory loss, and confusion Heme: Denies bruising, bleeding, and enlarged lymph nodes.  Physical Exam: There were no vitals taken for this visit. General:   Alert and oriented. Pleasant and cooperative. Well-nourished and well-developed.  Head:  Normocephalic and atraumatic. Eyes:  Without icterus, sclera clear and conjunctiva pink.  Ears:  Normal auditory acuity. Nose:  No deformity, discharge,  or lesions. Mouth:  No deformity or lesions, oral mucosa pink.  Neck:  Supple, without mass or thyromegaly. Lungs:  Clear to auscultation bilaterally. No wheezes, rales, or rhonchi. No distress.  Heart:  S1, S2 present without murmurs appreciated.  Abdomen:  +BS, soft, non-tender and non-distended. No HSM noted. No guarding or rebound. No masses appreciated.  Rectal:  Deferred  Msk:  Symmetrical without gross deformities. Normal posture. Pulses:  Normal pulses noted. Extremities:  Without clubbing or edema. Neurologic:  Alert and  oriented x4;  grossly normal neurologically. Skin:  Intact without significant lesions or rashes. Cervical Nodes:  No significant cervical adenopathy. Psych:  Alert and cooperative. Normal mood and affect.

## 2020-09-07 ENCOUNTER — Encounter: Payer: Self-pay | Admitting: Internal Medicine

## 2020-09-07 ENCOUNTER — Ambulatory Visit: Payer: Medicare HMO | Admitting: Gastroenterology

## 2020-09-10 ENCOUNTER — Other Ambulatory Visit: Payer: Self-pay | Admitting: Cardiology

## 2020-09-11 NOTE — Telephone Encounter (Signed)
Pt is overdue for follow-up, Needs appt in [facility] Clinic.  Last seen 04/30/20, missed f/u appt 05/22/20. Unable to refill till INR checked in clinic.  Pt is very noncompliant and should only get 1 month refills at a time when refilling Warfarin rx.  Called spoke with pt, pt states he has been going to his medical doctor in [place] to have INR checked, but he wishes to come back to [place] clinic for monitoring.  Pt states he has enough Warfarin to get him to his appt on 09/17/20 at 9:30am. Will forward refill to [name], RN box to refill once pt's INR has been checked in office.

## 2020-09-17 ENCOUNTER — Other Ambulatory Visit: Payer: Self-pay | Admitting: Cardiology

## 2020-09-17 ENCOUNTER — Ambulatory Visit (INDEPENDENT_AMBULATORY_CARE_PROVIDER_SITE_OTHER): Payer: Medicare HMO | Admitting: *Deleted

## 2020-09-17 DIAGNOSIS — I4821 Permanent atrial fibrillation: Secondary | ICD-10-CM

## 2020-09-17 DIAGNOSIS — Z5181 Encounter for therapeutic drug level monitoring: Secondary | ICD-10-CM

## 2020-09-17 LAB — POCT INR: INR: 1.5 — AB (ref 2.0–3.0)

## 2020-09-17 NOTE — Patient Instructions (Signed)
Continue warfarin 1 tablet daily (10mg ).  Recheck in 2 weeks

## 2020-09-17 NOTE — Telephone Encounter (Signed)
Pt came for INR check today.  Refilled warfarin x 1.

## 2020-10-05 ENCOUNTER — Ambulatory Visit (INDEPENDENT_AMBULATORY_CARE_PROVIDER_SITE_OTHER): Payer: Medicare HMO | Admitting: *Deleted

## 2020-10-05 DIAGNOSIS — I4821 Permanent atrial fibrillation: Secondary | ICD-10-CM

## 2020-10-05 DIAGNOSIS — Z5181 Encounter for therapeutic drug level monitoring: Secondary | ICD-10-CM | POA: Diagnosis not present

## 2020-10-05 LAB — POCT INR: INR: 5 — AB (ref 2.0–3.0)

## 2020-10-05 NOTE — Patient Instructions (Signed)
Hold warfarin tonight, take 1/2 tablet tomorrow then resume 1 tablet daily (10mg ).  Recheck in 2 weeks

## 2020-10-14 ENCOUNTER — Telehealth: Payer: Self-pay | Admitting: Family Medicine

## 2020-10-14 NOTE — Telephone Encounter (Signed)
Pt is calling looking for the letter that he dropped off for [place] to fill out. He's needing it sent in by the 16th.

## 2020-10-14 NOTE — Telephone Encounter (Signed)
Attempted to reach patient - voice mail not set up.  

## 2020-10-19 NOTE — Telephone Encounter (Signed)
AVS was addended by the provider and printed.  Patient picked up today.

## 2020-11-09 ENCOUNTER — Ambulatory Visit: Payer: Medicare HMO | Admitting: Cardiology

## 2020-11-23 ENCOUNTER — Other Ambulatory Visit: Payer: Self-pay | Admitting: Cardiology

## 2020-12-29 ENCOUNTER — Other Ambulatory Visit: Payer: Self-pay | Admitting: Cardiology

## 2020-12-29 NOTE — Telephone Encounter (Signed)
Pt is past due for INR check.  Called pt.  No answer and mailbox is full.  Refill for warfarin #10 tablets given only with message to pharmacy to have pt call for appt.

## 2021-01-05 ENCOUNTER — Other Ambulatory Visit: Payer: Self-pay

## 2021-01-05 ENCOUNTER — Ambulatory Visit (INDEPENDENT_AMBULATORY_CARE_PROVIDER_SITE_OTHER): Payer: Medicare HMO | Admitting: *Deleted

## 2021-01-05 DIAGNOSIS — I4821 Permanent atrial fibrillation: Secondary | ICD-10-CM

## 2021-01-05 DIAGNOSIS — Z5181 Encounter for therapeutic drug level monitoring: Secondary | ICD-10-CM | POA: Diagnosis not present

## 2021-01-05 LAB — POCT INR: INR: 1.6 — AB (ref 2.0–3.0)

## 2021-01-05 MED ORDER — WARFARIN SODIUM 10 MG PO TABS: ORAL_TABLET | ORAL | 0 refills | Status: DC

## 2021-01-05 NOTE — Patient Instructions (Signed)
Take warfarin 1 1/2 tablets tonight and tomorrow night then resume 1 tablet daily (10mg ).  Recheck in 2 weeks

## 2021-01-20 ENCOUNTER — Ambulatory Visit (INDEPENDENT_AMBULATORY_CARE_PROVIDER_SITE_OTHER): Payer: Medicare HMO | Admitting: Cardiology

## 2021-01-20 ENCOUNTER — Other Ambulatory Visit: Payer: Self-pay

## 2021-01-20 ENCOUNTER — Encounter: Payer: Self-pay | Admitting: Cardiology

## 2021-01-20 VITALS — BP 132/74 | HR 100 | Ht 73.0 in | Wt 272.0 lb

## 2021-01-20 DIAGNOSIS — I6529 Occlusion and stenosis of unspecified carotid artery: Secondary | ICD-10-CM | POA: Diagnosis not present

## 2021-01-20 DIAGNOSIS — I4891 Unspecified atrial fibrillation: Secondary | ICD-10-CM | POA: Diagnosis not present

## 2021-01-20 DIAGNOSIS — I5022 Chronic systolic (congestive) heart failure: Secondary | ICD-10-CM

## 2021-01-20 DIAGNOSIS — I10 Essential (primary) hypertension: Secondary | ICD-10-CM | POA: Diagnosis not present

## 2021-01-20 NOTE — Addendum Note (Signed)
Addended by: Roseanne Reno on: 01/20/2021 02:33 PM   Modules accepted: Orders

## 2021-01-20 NOTE — Progress Notes (Signed)
Clinical Summary Mr. Goar is a 65 y.o.male seen today for follow up of the following medical problems.   1. Chronic systolic heart failure  - 03/2013 LVEF 40-45%.  - repeat echo 07/2015 LVEF 45-50%, no WMA  - 02/25/14 Lexiscan MPI with fixed basal inferior defect, scar vs attenuation. LVEF 37%, basal inferior hypokinesis.     -no recent SOB/DOE. No recent edema - compliant with meds    2. Permanent afib  - DOACs have been to expensive.  - no recent palpitations - no bleeding on coumadin.  - pcp looking to see if can get eliquis.   3. Carotid stenosis  - followed by vascular, history of LICA chronic occlusion - 02/2019 [place] LICA occluded, RICA moderate  - no recent neuro symptoms  4. Hyperlipidemia - compliant with statin, labs followed by pcp  - labs followed by pcp  5. OSA screen  - + snore, + obesity. Denies somnolence. + afib, + chf - he has not scheduled his sleep eval   6. CVA - stroke 08/2011, aspirin started at that time.Of not his INR was subtherapeutic at that time.   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    Past Medical History:  Diagnosis Date  . Angina   . Atrial fibrillation (HCC)   . Atrial flutter (HCC) 2007   h/o  . Blood dyscrasia   . Coronary artery disease 08/19/11  . Diabetes mellitus    diet controlled  . Gilbert's syndrome   . Hyperlipidemia   . Hypertension   . Peripheral vascular disease (HCC)   . Stroke [facility]) ~08/17/11   "lost vision left eye"  . Vertigo      Allergies  Allergen Reactions  . Other Other (See Comments)    Mayonnaise - irritation in nasal passages if breathe around it   . Penicillins Rash     Current Outpatient Medications  Medication Sig Dispense Refill  . atorvastatin (LIPITOR) 40 MG tablet Take 1 tablet by mouth once daily 90 tablet 1  . Cholecalciferol (VITAMIN D3) 125 MCG (5000 UT) CAPS Take 1 capsule by mouth  daily.    . furosemide (LASIX) 20 MG tablet Take 1 tablet by mouth once daily 90 tablet 1  . lisinopril (ZESTRIL) 2.5 MG tablet Take 1 tablet by mouth once daily 90 tablet 2  . metoprolol succinate (TOPROL-XL) 100 MG 24 hr tablet TAKE 1 TABLET BY MOUTH TWICE DAILY WITH OR IMMEDIATELY FOLLOWING A MEAL 180 tablet 2  . Omega-3 Fatty Acids (FISH OIL) 1000 MG CAPS Take 1 capsule by mouth daily.    Marland Kitchen warfarin (COUMADIN) 10 MG tablet TAKE 1 TABLET BY MOUTH ONCE DAILY OR AS DIRECTED -- PATIENT NEEDS INR APPOINTMENT 30 tablet 0   No current facility-administered medications for this visit.     Past Surgical History:  Procedure Laterality Date  . ACHILLES TENDON REPAIR  06/2002   Left  . CARDIOVERSION  2007  . CAROTID ANGIOGRAM  08/19/2011   Procedure: CAROTID ANGIOGRAM;  Surgeon: Chuck Hint, MD;  Location[facility] C[facility] LAB;  Service: Cardiovascular;;  . CEREBRAL ANGIOGRAM N/A 08/19/2011   Procedure: CEREBRAL ANGIOGRAM;  Surgeon: Chuck Hint, MD;  Location: [facility] CATH LAB;  Service: Cardiovascular;  Laterality: N/A;     Allergies  Allergen Reactions  . Other Other (See Comments)    Mayonnaise - irritation in nasal passages if breathe around it   .  Penicillins Rash      Family History  Problem Relation Age of Onset  . Diabetes Mother   . Hyperlipidemia Mother   . Hypertension Mother   . Emphysema Father   . Heart failure Father   . Heart attack Brother   . Aneurysm Sister   . Deep vein thrombosis Sister   . Diabetes Sister   . Heart attack Sister      Social History Mr. Balan reports that he quit smoking about 9 years ago. His smoking use included cigars. He started smoking about 25 years ago. He has a 0.10 pack-year smoking history. He has never used smokeless tobacco. Mr. Bossier reports current alcohol use of about 3.0 standard drinks of alcohol per week.   Review of Systems CONSTITUTIONAL: No weight loss, fever, chills, weakness or fatigue.  HEENT: Eyes: No  visual loss, blurred vision, double vision or yellow sclerae.No hearing loss, sneezing, congestion, runny nose or sore throat.  SKIN: No rash or itching.  CARDIOVASCULAR: per hpi RESPIRATORY: No shortness of breath, cough or sputum.  GASTROINTESTINAL: No anorexia, nausea, vomiting or diarrhea. No abdominal pain or blood.  GENITOURINARY: No burning on urination, no polyuria NEUROLOGICAL: No headache, dizziness, syncope, paralysis, ataxia, numbness or tingling in the extremities. No change in bowel or bladder control.  MUSCULOSKELETAL: No muscle, back pain, joint pain or stiffness.  LYMPHATICS: No enlarged nodes. No history of splenectomy.  PSYCHIATRIC: No history of depression or anxiety.  ENDOCRINOLOGIC: No reports of sweating, cold or heat intolerance. No polyuria or polydipsia.  Marland Kitchen   Physical Examination Today's Vitals   01/20/21 1344  BP: 132/74  Pulse: 100  SpO2: 98%  Weight: 272 lb (123.4 kg)  Height: 6\' 1"  (1.854 m)   Body mass index is 35.89 kg/m.  Gen: resting comfortably, no acute distress HEENT: no scleral icterus, pupils equal round and reactive, no palptable cervical adenopathy,  CV: irreg, no m/r/g no jvd Resp: Clear to auscultation bilaterally GI: abdomen is soft, non-tender, non-distended, normal bowel sounds, no hepatosplenomegaly MSK: extremities are warm, no edema.  Skin: warm, no rash Neuro:  no focal deficits Psych: appropriate affect   Diagnostic Studies 03/2013 Echo Study Conclusions  - Left ventricle: Technically difficult study. There was mild concentric hypertrophy. Systolic function was mildly to moderately reduced. The estimated ejection fraction was in the range of 40% to 45%. Diffuse hypokinesis. The study is not technically sufficient to allow evaluation of LV diastolic function. - Mitral valve: Mildly thickened leaflets . - Left atrium: Moderately dilated. - Right atrium: The atrium was mildly dilated. 02/2014 Lexiscan MPI Analysis  of the raw data did not demonstrate any significant  extracardiac radiotracer uptake. Analysis of the perfusion images  demonstrated a moderate size, moderately intense, basilar inferior  wall defect. Images were worse on rest than stress. Inferior soft  tissue attenuation also noted. Left ventricular systolic function  was moderately reduced, calculated LV EF 37%. The aforementioned  wall is hypokinetic.  IMPRESSION:  1. Abnormal exercise Cardiolite stress test.  2. Rapid atrial fibrillation noted with exercise.  3. Fixed basal inferior wall defect. While this may represent  myocardial scar, interfering soft tissue attenuation cannot entirely  be excluded.  4. Moderately reduced LV systolic function, calculated LVEF 37%.  07/2015 echo Study Conclusions  - Left ventricle: The cavity size was normal. Wall thickness was normal. Systolic function was mildly reduced. The estimated ejection fraction was in the range of 45% to 50%. Wall motion was normal; there were no  regional wall motion abnormalities. - Aortic valve: Valve area (VTI): 2.39 cm^2. Valve area (Vmax): 2.29 cm^2. - Mitral valve: There was mild regurgitation. - Left atrium: The atrium was severely dilated. - Right atrium: The atrium was severely dilated. - Atrial septum: No defect or patent foramen ovale was identified. - Technically adequate study.   11/2015 Carotid US IMPRESSION: Complete occlusion of the left internal carotid artery near its origin.  Moderate eccentric calcified plaque is noted in the right carotid bulb and proximal right internal carotid artery consistent with 50-69% stenosis based on ultrasound and Doppler criteria.  02/2019 Carotid US Summary:  Right Carotid: Velocities in the right ICA are consistent with a 40-59%         stenosis.     Vertebrals: Bilateral vertebral arteries demonstrate antegrade flow.  Subclavians: Normal flow hemodynamics were seen in  bilateral subclavian        arteries.     Assessment and Plan  1. Chronic systolic heart failure  -- low normal to mildly decreased LVEF by last echo - no recent symptoms, continue current meds  2. Afib  - has been on coumadin with ASA since 2012 when admitted with CVA while on coumadin - previously DOACs were too expensive, he reports his pcp is looking into getting eliquis for him again at affordable price - EKG today shows afib at 79 bpm  3. Carotid stenosis  -repeat carotid US, repeat US  4. HTN - at goal, continue current meds      Antoine Poche, M.D.

## 2021-01-20 NOTE — Patient Instructions (Addendum)
Medication Instructions:  Your physician recommends that you continue on your current medications as directed. Please refer to the Current Medication list given to you today.  *If you need a refill on your cardiac medications before your next appointment, please call your pharmacy*   Lab Work: Requested labs from PCP If you have labs (blood work) drawn today and your tests are completely normal, you will receive your results only by: Marland Kitchen MyChart Message (if you have MyChart) OR . A paper copy in the mail If you have any lab test that is abnormal or we need to change your treatment, we will call you to review the results.   Testing/Procedures: Your physician has requested that you have a carotid duplex. This test is an ultrasound of the carotid arteries in your neck. It looks at blood flow through these arteries that supply the brain with blood. Allow one hour for this exam. There are no restrictions or special instructions.    Follow-Up: At [facility], you and your health needs are our priority.  As part of our continuing mission to provide you with exceptional heart care, we have created designated Provider Care Teams.  These Care Teams include your primary Cardiologist (physician) and Advanced Practice Providers (APPs -  Physician Assistants and Nurse Practitioners) who all work together to provide you with the care you need, when you need it.  We recommend signing up for the patient portal called "MyChart".  Sign up information is provided on this After Visit Summary.  MyChart is used to connect with patients for Virtual Visits (Telemedicine).  Patients are able to view lab/test results, encounter notes, upcoming appointments, etc.  Non-urgent messages can be sent to your provider as well.   To learn more about what you can do with MyChart, go to [email or web address].    Your next appointment:   6 month(s)  The format for your next appointment:   In Person  Provider:    Dina Rich, MD   Other Instructions

## 2021-01-28 ENCOUNTER — Ambulatory Visit (HOSPITAL_COMMUNITY): Payer: Medicare HMO

## 2021-02-04 ENCOUNTER — Other Ambulatory Visit: Payer: Self-pay | Admitting: Cardiology

## 2021-03-01 ENCOUNTER — Other Ambulatory Visit: Payer: Self-pay | Admitting: *Deleted

## 2021-03-01 MED ORDER — METOPROLOL SUCCINATE ER 100 MG PO TB24: 100.0000 mg | ORAL_TABLET | Freq: Two times a day (BID) | ORAL | 2 refills | Status: DC

## 2021-03-04 ENCOUNTER — Ambulatory Visit (INDEPENDENT_AMBULATORY_CARE_PROVIDER_SITE_OTHER): Payer: Medicare HMO | Admitting: *Deleted

## 2021-03-04 DIAGNOSIS — Z5181 Encounter for therapeutic drug level monitoring: Secondary | ICD-10-CM | POA: Diagnosis not present

## 2021-03-04 DIAGNOSIS — I4821 Permanent atrial fibrillation: Secondary | ICD-10-CM | POA: Diagnosis not present

## 2021-03-04 LAB — POCT INR: INR: 1.9 — AB (ref 2.0–3.0)

## 2021-03-04 MED ORDER — WARFARIN SODIUM 10 MG PO TABS: ORAL_TABLET | ORAL | 1 refills | Status: DC

## 2021-03-04 NOTE — Patient Instructions (Signed)
Take warfarin 1 1/2 tablets tonight then resume 1 tablet daily (10mg ).  Recheck in 4 weeks

## 2021-04-12 ENCOUNTER — Other Ambulatory Visit: Payer: Self-pay | Admitting: Cardiology

## 2021-04-21 ENCOUNTER — Telehealth: Payer: Self-pay | Admitting: *Deleted

## 2021-04-21 NOTE — Telephone Encounter (Signed)
04/21/21  LMOM for pt to call back to schedule INR appt.  Missed last appt on 04/08/21

## 2021-05-13 ENCOUNTER — Other Ambulatory Visit: Payer: Self-pay

## 2021-05-13 ENCOUNTER — Ambulatory Visit (INDEPENDENT_AMBULATORY_CARE_PROVIDER_SITE_OTHER): Payer: Medicare HMO | Admitting: *Deleted

## 2021-05-13 DIAGNOSIS — Z5181 Encounter for therapeutic drug level monitoring: Secondary | ICD-10-CM | POA: Diagnosis not present

## 2021-05-13 DIAGNOSIS — I4821 Permanent atrial fibrillation: Secondary | ICD-10-CM | POA: Diagnosis not present

## 2021-05-13 LAB — POCT INR: INR: 3 (ref 2.0–3.0)

## 2021-05-13 MED ORDER — WARFARIN SODIUM 10 MG PO TABS: ORAL_TABLET | ORAL | 1 refills | Status: DC

## 2021-05-13 NOTE — Patient Instructions (Signed)
Continue warfarin 1 tablet daily (10mg).  Recheck in 4 weeks 

## 2021-06-10 ENCOUNTER — Ambulatory Visit (INDEPENDENT_AMBULATORY_CARE_PROVIDER_SITE_OTHER): Payer: Medicare HMO | Admitting: *Deleted

## 2021-06-10 ENCOUNTER — Other Ambulatory Visit: Payer: Self-pay

## 2021-06-10 DIAGNOSIS — I4821 Permanent atrial fibrillation: Secondary | ICD-10-CM

## 2021-06-10 DIAGNOSIS — Z5181 Encounter for therapeutic drug level monitoring: Secondary | ICD-10-CM

## 2021-06-10 LAB — POCT INR: INR: 1.8 — AB (ref 2.0–3.0)

## 2021-06-10 MED ORDER — WARFARIN SODIUM 10 MG PO TABS: ORAL_TABLET | ORAL | 2 refills | Status: DC

## 2021-06-10 NOTE — Patient Instructions (Signed)
Continue warfarin 1 tablet daily (10mg ).  Recheck in 4 weeks

## 2021-06-28 ENCOUNTER — Other Ambulatory Visit: Payer: Self-pay | Admitting: Cardiology

## 2021-11-03 ENCOUNTER — Other Ambulatory Visit: Payer: Self-pay | Admitting: Cardiology

## 2021-11-12 ENCOUNTER — Telehealth: Payer: Self-pay | Admitting: Cardiology

## 2021-11-12 MED ORDER — ATORVASTATIN CALCIUM 40 MG PO TABS: 40.0000 mg | ORAL_TABLET | Freq: Every day | ORAL | 1 refills | Status: DC

## 2021-11-12 NOTE — Telephone Encounter (Signed)
?*  STAT* If patient is at the pharmacy, call can be transferred to refill team. ? ? ?1. Which medications need to be refilled? (please list name of each medication and dose if known) atorvastatin (LIPITOR) 40 MG tablet ? ?2. Which pharmacy/location (including street and city if local pharmacy) is medication to be sent to? ?[facility] - [place], [place] - [address] ? ? ?3. Do they need a 30 day or 90 day supply? 90 ds ? ?

## 2021-11-12 NOTE — Telephone Encounter (Signed)
Complete. Pt has appt with D. Dunn, PA-C ?

## 2021-12-06 ENCOUNTER — Other Ambulatory Visit: Payer: Self-pay | Admitting: *Deleted

## 2021-12-06 ENCOUNTER — Other Ambulatory Visit: Payer: Self-pay | Admitting: Cardiology

## 2021-12-06 MED ORDER — WARFARIN SODIUM 10 MG PO TABS: ORAL_TABLET | ORAL | 0 refills | Status: DC

## 2021-12-06 MED ORDER — WARFARIN SODIUM 10 MG PO TABS
ORAL_TABLET | ORAL | 0 refills | Status: DC
Start: 2021-12-06 — End: 2022-01-03

## 2021-12-09 ENCOUNTER — Ambulatory Visit: Payer: Medicare HMO | Admitting: Cardiology

## 2021-12-14 DIAGNOSIS — E119 Type 2 diabetes mellitus without complications: Secondary | ICD-10-CM | POA: Diagnosis not present

## 2021-12-14 DIAGNOSIS — H25813 Combined forms of age-related cataract, bilateral: Secondary | ICD-10-CM | POA: Diagnosis not present

## 2022-01-03 ENCOUNTER — Ambulatory Visit (INDEPENDENT_AMBULATORY_CARE_PROVIDER_SITE_OTHER): Payer: Medicare HMO | Admitting: *Deleted

## 2022-01-03 DIAGNOSIS — Z5181 Encounter for therapeutic drug level monitoring: Secondary | ICD-10-CM

## 2022-01-03 DIAGNOSIS — I4821 Permanent atrial fibrillation: Secondary | ICD-10-CM | POA: Diagnosis not present

## 2022-01-03 LAB — POCT INR: INR: 1.1 — AB (ref 2.0–3.0)

## 2022-01-03 MED ORDER — WARFARIN SODIUM 10 MG PO TABS: ORAL_TABLET | ORAL | 1 refills | Status: DC

## 2022-01-03 NOTE — Patient Instructions (Signed)
Has been out of warfarin 5-6 days.   ?Restart warfarin 1 tablet daily (10mg ).  ?Discussed importance of taking warfarin as prescribed and keeping INR appts. ?Recheck in 3 weeks ?

## 2022-01-18 ENCOUNTER — Ambulatory Visit: Payer: Medicare HMO | Admitting: Physician Assistant

## 2022-01-24 ENCOUNTER — Ambulatory Visit (INDEPENDENT_AMBULATORY_CARE_PROVIDER_SITE_OTHER): Payer: Medicare HMO | Admitting: *Deleted

## 2022-01-24 DIAGNOSIS — Z5181 Encounter for therapeutic drug level monitoring: Secondary | ICD-10-CM

## 2022-01-24 DIAGNOSIS — I4821 Permanent atrial fibrillation: Secondary | ICD-10-CM

## 2022-01-24 LAB — POCT INR: INR: 1.6 — AB (ref 2.0–3.0)

## 2022-01-24 MED ORDER — WARFARIN SODIUM 10 MG PO TABS: ORAL_TABLET | ORAL | 1 refills | Status: DC

## 2022-01-24 NOTE — Patient Instructions (Signed)
Increase warfarin to 1 tablet daily (10mg ) except 1 1/2 tablets on Mondays and Thursdays (15mg ) Recheck in 3 weeks

## 2022-02-11 ENCOUNTER — Ambulatory Visit (INDEPENDENT_AMBULATORY_CARE_PROVIDER_SITE_OTHER): Payer: Medicare HMO | Admitting: Cardiology

## 2022-02-11 ENCOUNTER — Encounter: Payer: Self-pay | Admitting: Cardiology

## 2022-02-11 VITALS — BP 124/78 | HR 71 | Ht 73.0 in | Wt 269.4 lb

## 2022-02-11 DIAGNOSIS — I10 Essential (primary) hypertension: Secondary | ICD-10-CM

## 2022-02-11 DIAGNOSIS — I6523 Occlusion and stenosis of bilateral carotid arteries: Secondary | ICD-10-CM | POA: Diagnosis not present

## 2022-02-11 DIAGNOSIS — I4891 Unspecified atrial fibrillation: Secondary | ICD-10-CM

## 2022-02-11 DIAGNOSIS — I5022 Chronic systolic (congestive) heart failure: Secondary | ICD-10-CM | POA: Diagnosis not present

## 2022-02-11 NOTE — Patient Instructions (Signed)
Medication Instructions:  Your physician recommends that you continue on your current medications as directed. Please refer to the Current Medication list given to you today.   Labwork: None today  Testing/Procedures: Your physician has requested that you have an echocardiogram. Echocardiography is a painless test that uses sound waves to create images of your heart. It provides your doctor with information about the size and shape of your heart and how well your heart's chambers and valves are working. This procedure takes approximately one hour. There are no restrictions for this procedure.    Your physician has requested that you have a carotid duplex. This test is an ultrasound of the carotid arteries in your neck. It looks at blood flow through these arteries that supply the brain with blood. Allow one hour for this exam. There are no restrictions or special instructions.   Follow-Up: 6 months  Any Other Special Instructions Will Be Listed Below (If Applicable).  If you need a refill on your cardiac medications before your next appointment, please call your pharmacy.

## 2022-02-11 NOTE — Progress Notes (Signed)
Clinical Summary [name] is a 66 y.o.male seen today for follow up of the following medical problems.    1. Chronic systolic heart failure  - 03/2013 LVEF 40-45%.  - repeat echo 07/2015 LVEF 45-50%, no WMA  - 02/25/14 Lexiscan MPI with fixed basal inferior defect, scar vs attenuation. LVEF 37%, basal inferior hypokinesis.      - no SOB/DOE. Occasional LE edema at times. Takes lasix 20mg  daily.         2. Permanent afib   - DOACs have been to expensive.    - no palpitations.  - compliant with meds - no bleeding on coumadin   3. Carotid stenosis  - followed by vascular, history of LICA chronic occlusion  - 02/2019 US LICA occluded, RICA moderate  - denies neurological symptoms   4. Hyperlipidemia  - compliant with statin, labs followed by pcp    - labs followed by pcp   5. OSA screen  - + snore, + obesity. Denies somnolence. + afib, + chf  - he has not scheduled his sleep eval     6. CVA - stroke 08/2011,. Of not his INR was subtherapeutic at that time.      SH: son is a Charity fundraiserchemist who works for a company that makes insulin, travelling [place] for training. Works as Theatre managercontract cleaner, mainly businesses. [place] now works in [employer]   Past Medical History:  Diagnosis Date   Angina    Atrial fibrillation [facility](HCC)    Atrial flutter (HCC) 2007   h/o   Blood dyscrasia    Coronary artery disease 08/19/11   Diabetes mellitus    diet controlled   Gilbert's syndrome    Hyperlipidemia    Hypertension    Peripheral vascular disease (HCC)    Stroke (HCC) ~08/17/11   "lost vision left eye"   Vertigo      Allergies  Allergen Reactions   Other Other (See Comments)    Mayonnaise - irritation in nasal passages if breathe around it    Penicillins Rash     Current Outpatient Medications  Medication Sig Dispense Refill   atorvastatin (LIPITOR) 40 MG tablet Take 1 tablet (40 mg total) by mouth daily. 90 tablet 1   Cholecalciferol (VITAMIN D3) 125 MCG (5000 UT) CAPS Take  1 capsule by mouth daily.     furosemide (LASIX) 20 MG tablet Take 1 tablet by mouth once daily 90 tablet 2   lisinopril (ZESTRIL) 2.5 MG tablet Take 1 tablet by mouth once daily 90 tablet 0   metoprolol succinate (TOPROL-XL) 100 MG 24 hr tablet Take 1 tablet (100 mg total) by mouth 2 (two) times daily. Take with or immediately following a meal. 180 tablet 2   Omega-3 Fatty Acids (FISH OIL) 1000 MG CAPS Take 1 capsule by mouth daily.     sildenafil (VIAGRA) 25 MG tablet 1-2 tablets as needed 1 hr before sexual activity     warfarin (COUMADIN) 10 MG tablet TAKE 1 TABLET BY MOUTH ONCE DAILY OR AS DIRECTED 30 tablet 1   No current facility-administered medications for this visit.     Past Surgical History:  Procedure Laterality Date   ACHILLES TENDON REPAIR  06/2002   Left   CARDIOVERSION  2007   CAROTID ANGIOGRAM  08/19/2011   Procedure: CAROTID ANGIOGRAM;  Surgeon: Chuck Hinthristopher S Dickson, MD;  [facility];  Service: Cardiovascular;;   CEREBRAL ANGIOGRAM N/A 08/19/2011   Procedure: CEREBRAL ANGIOGRAM;  Surgeon: Cristal Deerhristopher  Adele Dan, MD;  Location: [facility] CATH LAB;  Service: Cardiovascular;  Laterality: N/A;     Allergies  Allergen Reactions   Other Other (See Comments)    Mayonnaise - irritation in nasal passages if breathe around it    Penicillins Rash      Family History  Problem Relation Age of Onset   Diabetes Mother    Hyperlipidemia Mother    Hypertension Mother    Emphysema Father    Heart failure Father    Heart attack Brother    Aneurysm Sister    Deep vein thrombosis Sister    Diabetes Sister    Heart attack Sister      Social History [name] reports that he quit smoking about 10 years ago. His smoking use included cigars. He started smoking about 26 years ago. He has a 0.10 pack-year smoking history. He has never used smokeless tobacco. [name] reports current alcohol use of about 3.0 standard drinks of alcohol per week.   Review of  Systems CONSTITUTIONAL: No weight loss, fever, chills, weakness or fatigue.  HEENT: Eyes: No visual loss, blurred vision, double vision or yellow sclerae.No hearing loss, sneezing, congestion, runny nose or sore throat.  SKIN: No rash or itching.  CARDIOVASCULAR: per hpi RESPIRATORY: No shortness of breath, cough or sputum.  GASTROINTESTINAL: No anorexia, nausea, vomiting or diarrhea. No abdominal pain or blood.  GENITOURINARY: No burning on urination, no polyuria NEUROLOGICAL: No headache, dizziness, syncope, paralysis, ataxia, numbness or tingling in the extremities. No change in bowel or bladder control.  MUSCULOSKELETAL: No muscle, back pain, joint pain or stiffness.  LYMPHATICS: No enlarged nodes. No history of splenectomy.  PSYCHIATRIC: No history of depression or anxiety.  ENDOCRINOLOGIC: No reports of sweating, cold or heat intolerance. No polyuria or polydipsia.  Marland Kitchen   Physical Examination Today's Vitals   02/11/22 0930  BP: 138/80  Pulse: 71  SpO2: 98%  Weight: 269 lb 6.4 oz (122.2 kg)  Height: 6\' 1"  (1.854 m)   Body mass index is 35.54 kg/m.  Gen: resting comfortably, no acute distress HEENT: no scleral icterus, pupils equal round and reactive, no palptable cervical adenopathy,  CV: RRR, no m/r/g no jvd Resp: Clear to auscultation bilaterally GI: abdomen is soft, non-tender, non-distended, normal bowel sounds, no hepatosplenomegaly MSK: extremities are warm, no edema.  Skin: warm, no rash Neuro:  no focal deficits Psych: appropriate affect   Diagnostic Studies 03/2013 Echo   Study Conclusions  - Left ventricle: Technically difficult study. There was mild concentric hypertrophy. Systolic function was mildly to moderately reduced. The estimated ejection fraction was in the range of 40% to 45%. Diffuse hypokinesis. The study is not technically sufficient to allow evaluation of LV diastolic function. - Mitral valve: Mildly thickened leaflets . - Left atrium:  Moderately dilated. - Right atrium: The atrium was mildly dilated.  02/2014 Lexiscan MPI   Analysis of the raw data did not demonstrate any significant   extracardiac radiotracer uptake. Analysis of the perfusion images   demonstrated a moderate size, moderately intense, basilar inferior   wall defect. Images were worse on rest than stress. Inferior soft   tissue attenuation also noted. Left ventricular systolic function   was moderately reduced, calculated LV EF 37%. The aforementioned   wall is hypokinetic.   IMPRESSION:   1. Abnormal exercise Cardiolite stress test.   2. Rapid atrial fibrillation noted with exercise.   3. Fixed basal inferior wall defect. While this may represent   myocardial  scar, interfering soft tissue attenuation cannot entirely   be excluded.   4. Moderately reduced LV systolic function, calculated LVEF 37%.   07/2015 echo Study Conclusions  - Left ventricle: The cavity size was normal. Wall thickness was   normal. Systolic function was mildly reduced. The estimated   ejection fraction was in the range of 45% to 50%. Wall motion was   normal; there were no regional wall motion abnormalities. - Aortic valve: Valve area (VTI): 2.39 cm^2. Valve area (Vmax):   2.29 cm^2. - Mitral valve: There was mild regurgitation. - Left atrium: The atrium was severely dilated. - Right atrium: The atrium was severely dilated. - Atrial septum: No defect or patent foramen ovale was identified. - Technically adequate study.     11/2015 Carotid US IMPRESSION: Complete occlusion of the left internal carotid artery near its origin.   Moderate eccentric calcified plaque is noted in the right carotid bulb and proximal right internal carotid artery consistent with 50-69% stenosis based on ultrasound and Doppler criteria.   02/2019 Carotid US Summary:  Right Carotid: Velocities in the right ICA are consistent with a 40-59%                 stenosis.     Vertebrals:   Bilateral vertebral arteries demonstrate antegrade flow.  Subclavians: Normal flow hemodynamics were seen in bilateral subclavian               arteries.       Assessment and Plan  1. Chronic systolic heart failure   -- low normal to mildly decreased LVEF by last echo - repeat US, pending LVEF may require adjustements to medical therapy.    2. Afib   - DOACs previously too expensive, we are looking into costs again of eliquis - no symptoms, continue current meds - EKG shows rate controlled afib   3. Carotid stenosis   -we will reorder his carotid US   4. HTN - at goal, continue current meds        Antoine [name], M.D.

## 2022-02-14 ENCOUNTER — Ambulatory Visit: Payer: Medicare HMO | Admitting: *Deleted

## 2022-02-14 DIAGNOSIS — Z5181 Encounter for therapeutic drug level monitoring: Secondary | ICD-10-CM | POA: Diagnosis not present

## 2022-02-14 DIAGNOSIS — I4821 Permanent atrial fibrillation: Secondary | ICD-10-CM

## 2022-02-14 LAB — POCT INR: INR: 2.1 (ref 2.0–3.0)

## 2022-02-14 MED ORDER — APIXABAN 5 MG PO TABS: 5.0000 mg | ORAL_TABLET | Freq: Two times a day (BID) | ORAL | 0 refills | Status: DC

## 2022-02-14 NOTE — Patient Instructions (Addendum)
Continue warfarin 1 tablet daily (10mg ) except 1 1/2 tablets on Mondays and Thursdays (15mg ) Recheck in 4 weeks Rx sent in for patient to check price of Eliquis.  If affordable he will switch over at next OV.

## 2022-03-01 ENCOUNTER — Other Ambulatory Visit: Payer: Medicare HMO

## 2022-03-09 ENCOUNTER — Other Ambulatory Visit: Payer: Self-pay | Admitting: Cardiology

## 2022-03-09 ENCOUNTER — Encounter: Payer: Self-pay | Admitting: Cardiology

## 2022-03-14 ENCOUNTER — Ambulatory Visit (INDEPENDENT_AMBULATORY_CARE_PROVIDER_SITE_OTHER): Payer: Medicare HMO | Admitting: *Deleted

## 2022-03-14 DIAGNOSIS — I4821 Permanent atrial fibrillation: Secondary | ICD-10-CM | POA: Diagnosis not present

## 2022-03-14 DIAGNOSIS — Z5181 Encounter for therapeutic drug level monitoring: Secondary | ICD-10-CM | POA: Diagnosis not present

## 2022-03-14 LAB — POCT INR: INR: 1.2 — AB (ref 2.0–3.0)

## 2022-03-23 ENCOUNTER — Other Ambulatory Visit: Payer: Self-pay | Admitting: Cardiology

## 2022-03-23 DIAGNOSIS — I6523 Occlusion and stenosis of bilateral carotid arteries: Secondary | ICD-10-CM

## 2022-04-06 ENCOUNTER — Ambulatory Visit (INDEPENDENT_AMBULATORY_CARE_PROVIDER_SITE_OTHER): Payer: Medicare HMO

## 2022-04-06 DIAGNOSIS — I6523 Occlusion and stenosis of bilateral carotid arteries: Secondary | ICD-10-CM | POA: Diagnosis not present

## 2022-04-06 DIAGNOSIS — I5022 Chronic systolic (congestive) heart failure: Secondary | ICD-10-CM

## 2022-04-07 LAB — ECHOCARDIOGRAM COMPLETE
AR max vel: 2.71 cm2
AV Area VTI: 2.76 cm2
AV Area mean vel: 2.61 cm2
AV Mean grad: 2.7 mmHg
AV Peak grad: 5.3 mmHg
Ao pk vel: 1.15 m/s
Area-P 1/2: 5.36 cm2
Calc EF: 52.3 %
MV M vel: 4.31 m/s
MV Peak grad: 74.2 mmHg
S' Lateral: 2.93 cm
Single Plane A2C EF: 51.2 %
Single Plane A4C EF: 57.3 %

## 2022-04-12 DIAGNOSIS — M79674 Pain in right toe(s): Secondary | ICD-10-CM | POA: Diagnosis not present

## 2022-04-12 DIAGNOSIS — B351 Tinea unguium: Secondary | ICD-10-CM | POA: Diagnosis not present

## 2022-04-18 ENCOUNTER — Other Ambulatory Visit: Payer: Self-pay | Admitting: Cardiology

## 2022-04-19 NOTE — Telephone Encounter (Addendum)
Prescription refill request for Eliquis received. Indication: AF Last office visit: 02/11/22  Dominga Ferry MD Scr: Having drawn today Age: 66 Weight: 122.2 kg  Pt did not go for lab work as ordered.  Called pt.  States he forgot about lab work.  Will go today.  Will refill Rx x 1 and wait lab results to review.

## 2022-05-31 ENCOUNTER — Other Ambulatory Visit: Payer: Self-pay | Admitting: Cardiology

## 2022-06-11 ENCOUNTER — Other Ambulatory Visit: Payer: Self-pay | Admitting: Cardiology

## 2022-06-21 DIAGNOSIS — I4821 Permanent atrial fibrillation: Secondary | ICD-10-CM | POA: Diagnosis not present

## 2022-06-21 DIAGNOSIS — Z5181 Encounter for therapeutic drug level monitoring: Secondary | ICD-10-CM | POA: Diagnosis not present

## 2022-06-22 LAB — BASIC METABOLIC PANEL
BUN: 18 mg/dL (ref 7–25)
CO2: 27 mmol/L (ref 20–32)
Calcium: 10.2 mg/dL (ref 8.6–10.3)
Chloride: 106 mmol/L (ref 98–110)
Creat: 1.13 mg/dL (ref 0.70–1.35)
Glucose, Bld: 93 mg/dL (ref 65–99)
Potassium: 4.6 mmol/L (ref 3.5–5.3)
Sodium: 141 mmol/L (ref 135–146)

## 2022-06-23 ENCOUNTER — Other Ambulatory Visit: Payer: Self-pay | Admitting: Cardiology

## 2022-06-23 NOTE — Telephone Encounter (Signed)
Prescription refill request for Eliquis received. Indication: AF Last office visit: 02/11/22  Zandra Abts MD Scr: 1.13 on 06/21/22 Age: 66 Weight: 122.2kg  Based on above findings Eliquis 5mg  twice daily is the appropriate dose.  Refill approved.

## 2022-08-04 ENCOUNTER — Encounter: Payer: Self-pay | Admitting: *Deleted

## 2022-08-24 ENCOUNTER — Ambulatory Visit: Payer: Medicare HMO | Attending: Cardiology | Admitting: Cardiology

## 2022-08-24 ENCOUNTER — Encounter: Payer: Self-pay | Admitting: Cardiology

## 2022-08-24 VITALS — BP 120/70 | HR 86 | Ht 73.0 in | Wt 276.2 lb

## 2022-08-24 DIAGNOSIS — I4821 Permanent atrial fibrillation: Secondary | ICD-10-CM

## 2022-08-24 DIAGNOSIS — I6522 Occlusion and stenosis of left carotid artery: Secondary | ICD-10-CM

## 2022-08-24 DIAGNOSIS — I5022 Chronic systolic (congestive) heart failure: Secondary | ICD-10-CM

## 2022-08-24 DIAGNOSIS — D6869 Other thrombophilia: Secondary | ICD-10-CM | POA: Diagnosis not present

## 2022-08-24 DIAGNOSIS — G4730 Sleep apnea, unspecified: Secondary | ICD-10-CM | POA: Diagnosis not present

## 2022-08-24 MED ORDER — FUROSEMIDE 20 MG PO TABS: 20.0000 mg | ORAL_TABLET | Freq: Every day | ORAL | 2 refills | Status: DC

## 2022-08-24 NOTE — Patient Instructions (Addendum)
Medication Instructions:  Your physician has recommended you make the following change in your medication:  Continue furosemide 20 mg daily. May take an additional 20 mg daily as needed for swelling Continue other medications the same  Labwork: none  Testing/Procedures: none  Follow-Up: Your physician recommends that you schedule a follow-up appointment in: 6 months  Any Other Special Instructions Will Be Listed Below (If Applicable). You have been referred to [facility]  If you need a refill on your cardiac medications before your next appointment, please call your pharmacy.

## 2022-08-24 NOTE — Progress Notes (Signed)
Clinical Summary Mr. Weatherall is a 66 y.o.male seen today for follow up of the following medical problems.       1. Chronic HFimpEF - 03/2013 LVEF 40-45%.  - repeat echo 07/2015 LVEF 45-50%, no WMA  - 02/25/14 Lexiscan MPI with fixed basal inferior defect, scar vs attenuation. LVEF 37%, basal inferior hypokinesis.     04/2022 echo: LVEF 55-60%, indet diastolic fxn.  - no SOB/DOE. Occasional LE edema at times. Some recent right leg swelling, taking lasix 20mg  daily.        2. Permanent afib  -no palpitations - no bleeding on eliquis   3. Carotid stenosis  - followed by vascular, history of LICA chronic occlusion  - 02/2019 03/2019 LICA occluded, RICA moderate  - denies neurological symptoms  04/2022 carotid 05/2022: RICA 40-59%, LICA chronic total occlusion   4. Hyperlipidemia  - compliant with statin, labs followed by pcp    - labs followed by pcp   5. OSA screen  - + snore, + obesity. Denies somnolence. + afib, + chf  - he has not scheduled his sleep eval yet but is interested in doing.      6. CVA - stroke 08/2011,. Of not his INR was subtherapeutic at that time.      SH: son is a 09/2011 who works for a company that makes insulin, [place]. Works as [place], mainly businesses. Son now works in Theatre manager   Past Medical History:  Diagnosis Date   Angina    Atrial fibrillation [facility])    Atrial flutter (HCC) 2007   h/o   Blood dyscrasia    Coronary artery disease 08/19/11   Diabetes mellitus    diet controlled   Gilbert's syndrome    Hyperlipidemia    Hypertension    Peripheral vascular disease (HCC)    Stroke (HCC) ~08/17/11   "lost vision left eye"   Vertigo      Allergies  Allergen Reactions   Other Other (See Comments)    Mayonnaise - irritation in nasal passages if breathe around it    Penicillins Rash     Current Outpatient Medications  Medication Sig Dispense Refill   apixaban (ELIQUIS) 5 MG TABS  tablet Take 1 tablet by mouth twice daily 60 tablet 5   atorvastatin (LIPITOR) 40 MG tablet Take 1 tablet by mouth once daily 90 tablet 1   Cholecalciferol (VITAMIN D3) 125 MCG (5000 UT) CAPS Take 1 capsule by mouth daily.     furosemide (LASIX) 20 MG tablet Take 1 tablet by mouth once daily 90 tablet 0   lisinopril (ZESTRIL) 2.5 MG tablet Take 1 tablet by mouth once daily 90 tablet 1   metoprolol succinate (TOPROL-XL) 100 MG 24 hr tablet TAKE 1 TABLET BY MOUTH TWICE DAILY. TAKE WITH OR  IMMEDIATELY FOLLOWING A MEAL 180 tablet 0   Omega-3 Fatty Acids (FISH OIL) 1000 MG CAPS Take 1 capsule by mouth daily.     sildenafil (VIAGRA) 25 MG tablet 1-2 tablets as needed 1 hr before sexual activity     No current facility-administered medications for this visit.     Past Surgical History:  Procedure Laterality Date   ACHILLES TENDON REPAIR  06/2002   Left   CARDIOVERSION  2007   CAROTID ANGIOGRAM  08/19/2011   Procedure: CAROTID ANGIOGRAM;  Surgeon: 08/21/2011, MD;  Location: [facility] CATH LAB;  Service: Cardiovascular;;   CEREBRAL ANGIOGRAM N/A 08/19/2011  Procedure: CEREBRAL ANGIOGRAM;  Surgeon: Angelia Mould, MD;  Location: [facility] CATH LAB;  Service: Cardiovascular;  Laterality: N/A;     Allergies  Allergen Reactions   Other Other (See Comments)    Mayonnaise - irritation in nasal passages if breathe around it    Penicillins Rash      Family History  Problem Relation Age of Onset   Diabetes Mother    Hyperlipidemia Mother    Hypertension Mother    Emphysema Father    Heart failure Father    Heart attack Brother    Aneurysm Sister    Deep vein thrombosis Sister    Diabetes Sister    Heart attack Sister      Social History Mr. Croney reports that he quit smoking about 11 years ago. His smoking use included cigars and cigarettes. He started smoking about 26 years ago. He has a 0.10 pack-year smoking history. He has never used smokeless tobacco. Mr. Berteau reports  current alcohol use of about 3.0 standard drinks of alcohol per week.   Review of Systems CONSTITUTIONAL: No weight loss, fever, chills, weakness or fatigue.  HEENT: Eyes: No visual loss, blurred vision, double vision or yellow sclerae.No hearing loss, sneezing, congestion, runny nose or sore throat.  SKIN: No rash or itching.  CARDIOVASCULAR: per hpi RESPIRATORY: No shortness of breath, cough or sputum.  GASTROINTESTINAL: No anorexia, nausea, vomiting or diarrhea. No abdominal pain or blood.  GENITOURINARY: No burning on urination, no polyuria NEUROLOGICAL: No headache, dizziness, syncope, paralysis, ataxia, numbness or tingling in the extremities. No change in bowel or bladder control.  MUSCULOSKELETAL: No muscle, back pain, joint pain or stiffness.  LYMPHATICS: No enlarged nodes. No history of splenectomy.  PSYCHIATRIC: No history of depression or anxiety.  ENDOCRINOLOGIC: No reports of sweating, cold or heat intolerance. No polyuria or polydipsia.  Marland Kitchen   Physical Examination Today's Vitals   08/24/22 1009  BP: 120/70  Pulse: 86  SpO2: 97%  Weight: 276 lb 3.2 oz (125.3 kg)  Height: 6\' 1"  (1.854 m)   Body mass index is 36.44 kg/m.  Gen: resting comfortably, no acute distress HEENT: no scleral icterus, pupils equal round and reactive, no palptable cervical adenopathy,  CV: RRR, no m/r/g no jvd Resp: Clear to auscultation bilaterally GI: abdomen is soft, non-tender, non-distended, normal bowel sounds, no hepatosplenomegaly MSK: extremities are warm, 1+bilateral LE edema Skin: warm, no rash Neuro:  no focal deficits Psych: appropriate affect   Diagnostic Studies 03/2013 Echo   Study Conclusions  - Left ventricle: Technically difficult study. There was mild concentric hypertrophy. Systolic function was mildly to moderately reduced. The estimated ejection fraction was in the range of 40% to 45%. Diffuse hypokinesis. The study is not technically sufficient to allow  evaluation of LV diastolic function. - Mitral valve: Mildly thickened leaflets . - Left atrium: Moderately dilated. - Right atrium: The atrium was mildly dilated.  02/2014 Lexiscan MPI   Analysis of the raw data did not demonstrate any significant   extracardiac radiotracer uptake. Analysis of the perfusion images   demonstrated a moderate size, moderately intense, basilar inferior   wall defect. Images were worse on rest than stress. Inferior soft   tissue attenuation also noted. Left ventricular systolic function   was moderately reduced, calculated LV EF 37%. The aforementioned   wall is hypokinetic.   IMPRESSION:   1. Abnormal exercise Cardiolite stress test.   2. Rapid atrial fibrillation noted with exercise.   3. Fixed basal inferior wall  defect. While this may represent   myocardial scar, interfering soft tissue attenuation cannot entirely   be excluded.   4. Moderately reduced LV systolic function, calculated LVEF 37%.   07/2015 echo Study Conclusions  - Left ventricle: The cavity size was normal. Wall thickness was   normal. Systolic function was mildly reduced. The estimated   ejection fraction was in the range of 45% to 50%. Wall motion was   normal; there were no regional wall motion abnormalities. - Aortic valve: Valve area (VTI): 2.39 cm^2. Valve area (Vmax):   2.29 cm^2. - Mitral valve: There was mild regurgitation. - Left atrium: The atrium was severely dilated. - Right atrium: The atrium was severely dilated. - Atrial septum: No defect or patent foramen ovale was identified. - Technically adequate study.     11/2015 Carotid US IMPRESSION: Complete occlusion of the left internal carotid artery near its origin.   Moderate eccentric calcified plaque is noted in the right carotid bulb and proximal right internal carotid artery consistent with 50-69% stenosis based on ultrasound and Doppler criteria.   02/2019 Carotid US Summary:  Right Carotid: Velocities in  the right ICA are consistent with a 40-59%                 stenosis.     Vertebrals:  Bilateral vertebral arteries demonstrate antegrade flow.  Subclavians: Normal flow hemodynamics were seen in bilateral subclavian               arteries.      04/2022 echo 1. Left ventricular ejection fraction, by estimation, is 55 to 60%. The  left ventricle has normal function. The left ventricle has no regional  wall motion abnormalities. Left ventricular diastolic parameters are  indeterminate.   2. Right ventricular systolic function is normal. The right ventricular  size is normal. Tricuspid regurgitation signal is inadequate for assessing  PA pressure.   3. Left atrial size was severely dilated.   4. The mitral valve is abnormal. Mild mitral valve regurgitation. No  evidence of mitral stenosis.   5. The aortic valve is tricuspid. Aortic valve regurgitation is not  visualized. No aortic stenosis is present.   6. The inferior vena cava is normal in size with greater than 50%  respiratory variability, suggesting right atrial pressure of 3 mmHg.    04/2022 carotid US Summary:  Right Carotid: Velocities in the right ICA are consistent with a 40-59%                 stenosis.   Left Carotid: Evidence consistent with a total occlusion of the left ICA.   Vertebrals:  Bilateral vertebral arteries demonstrate antegrade flow.  Subclavians: Normal flow hemodynamics were seen in bilateral subclavian               arteries.    Assessment and Plan   1. Chronic HFimpEF -- LVEF has normalized -some recent edema, change lasix to 20mg  daily, take additional 20mg  prn.    2. Afib /acquired thrombophilia - no symptoms, continue current meds including eliquis for stroke prevention   3. Carotid stenosis   -stable disease by recent US, continue to monitor with repeat US next year   4. HTN - he is at goal, continue current meds  5. OSA screen - signs and symptoms of OSA, refer to pulmonary   [facility]  for dental extractions. Can hold eliquis 2 days prior, resume day after        Arnoldo Lenis,  M.D. 

## 2022-09-08 ENCOUNTER — Ambulatory Visit: Payer: Medicare HMO | Admitting: Orthopaedic Surgery

## 2022-09-08 ENCOUNTER — Encounter: Payer: Self-pay | Admitting: Orthopaedic Surgery

## 2022-09-08 VITALS — BP 129/83 | HR 83 | Ht 73.0 in | Wt 270.0 lb

## 2022-09-08 DIAGNOSIS — M25541 Pain in joints of right hand: Secondary | ICD-10-CM | POA: Diagnosis not present

## 2022-09-08 DIAGNOSIS — M25531 Pain in right wrist: Secondary | ICD-10-CM | POA: Diagnosis not present

## 2022-09-08 LAB — URIC ACID: Uric Acid, Serum: 8.8 mg/dL — ABNORMAL HIGH (ref 4.0–8.0)

## 2022-09-08 MED ORDER — PREDNISONE 5 MG (21) PO TBPK: ORAL_TABLET | ORAL | 0 refills | Status: DC

## 2022-09-08 NOTE — Progress Notes (Signed)
Subjective:    Patient ID: [name], male    DOB: [birth year], 67 y.o.   MRN: [id number]  HPI He has developed pain in the right wrist and hand and fingers over the last two to three weeks.  He has no trauma.  He began having swelling last week.  He has no redness, no numbness.  He has history of trigger thumb but his thumb is not triggering.  Most of the pain is on the ulnar dorsal side of the right wrist.  He has used rubs, tylenol, and a small wrist band which help only slightly.   Review of Systems  Constitutional:  Positive for activity change.  Musculoskeletal:  Positive for arthralgias, joint swelling and myalgias.  All other systems reviewed and are negative. For Review of Systems, all other systems reviewed and are negative.  The following is a summary of the past history medically, past history surgically, known current medicines, social history and family history.  This information is gathered electronically by the computer from prior information and documentation.  I review this each visit and have found including this information at this point in the chart is beneficial and informative.   Past Medical History:  Diagnosis Date   Angina    Atrial fibrillation ([place])    Atrial flutter ([place]) 2007   h/o   Blood dyscrasia    Coronary artery disease 08/19/11   Diabetes mellitus    diet controlled   Gilbert's syndrome    Hyperlipidemia    Hypertension    Peripheral vascular disease ([place])    Stroke ([place]) ~08/17/11   "lost vision left eye"   Vertigo     Past Surgical History:  Procedure Laterality Date   ACHILLES TENDON REPAIR  06/2002   Left   CARDIOVERSION  2007   CAROTID ANGIOGRAM  08/19/2011   Procedure: CAROTID ANGIOGRAM;  Surgeon: Angelia Mould, MD;  Location: [facility] CATH LAB;  Service: Cardiovascular;;   CEREBRAL ANGIOGRAM N/A 08/19/2011   Procedure: CEREBRAL ANGIOGRAM;  Surgeon: Angelia Mould, MD;  Location: [facility] CATH LAB;  Service:  Cardiovascular;  Laterality: N/A;    Current Outpatient Medications on File Prior to Visit  Medication Sig Dispense Refill   apixaban (ELIQUIS) 5 MG TABS tablet Take 1 tablet by mouth twice daily 60 tablet 5   atorvastatin (LIPITOR) 40 MG tablet Take 1 tablet by mouth once daily 90 tablet 1   Cholecalciferol (VITAMIN D3) 125 MCG (5000 UT) CAPS Take 1 capsule by mouth daily.     furosemide (LASIX) 20 MG tablet Take 1 tablet (20 mg total) by mouth daily. May take an additional 20 mg daily as needed for swelling 180 tablet 2   lisinopril (ZESTRIL) 2.5 MG tablet Take 1 tablet by mouth once daily 90 tablet 1   metoprolol succinate (TOPROL-XL) 100 MG 24 hr tablet TAKE 1 TABLET BY MOUTH TWICE DAILY. TAKE WITH OR  IMMEDIATELY FOLLOWING A MEAL 180 tablet 0   Omega-3 Fatty Acids (FISH OIL) 1000 MG CAPS Take 1 capsule by mouth daily.     sildenafil (VIAGRA) 25 MG tablet 1-2 tablets as needed 1 hr before sexual activity     No current facility-administered medications on file prior to visit.    Social History   Socioeconomic History   Marital status: Married    Spouse name: Not on file   Number of children: Not on file   Years of education: Not on file   Highest education level: Not  on file  Occupational History   Not on file  Tobacco Use   Smoking status: Former    Packs/day: 0.01    Years: 10.00    Total pack years: 0.10    Types: Cigars, Cigarettes    Start date: 01/04/1996    Quit date: 08/16/2011    Years since quitting: 11.0   Smokeless tobacco: Never  Vaping Use   Vaping Use: Never used  Substance and Sexual Activity   Alcohol use: Yes    Alcohol/week: 3.0 standard drinks of alcohol    Types: 3 Cans of beer per week   Drug use: No   Sexual activity: Yes    Birth control/protection: Condom  Other Topics Concern   Not on file  Social History Narrative   Not on file   Social Determinants of Health   Financial Resource Strain: Not on file  Food Insecurity: Not on file   Transportation Needs: Not on file  Physical Activity: Not on file  Stress: Not on file  Social Connections: Not on file  Intimate Partner Violence: Not on file    Family History  Problem Relation Age of Onset   Diabetes Mother    Hyperlipidemia Mother    Hypertension Mother    Emphysema Father    Heart failure Father    Heart attack Brother    Aneurysm Sister    Deep vein thrombosis Sister    Diabetes Sister    Heart attack Sister     BP 129/83   Pulse 83   Ht 6\' 1"  (1.854 m)   Wt 270 lb (122.5 kg)   BMI 35.62 kg/m   Body mass index is 35.62 kg/m.      Objective:   Physical Exam Vitals and nursing note reviewed. Exam conducted with a chaperone present.  Constitutional:      Appearance: He is well-developed.  HENT:     Head: Normocephalic and atraumatic.  Eyes:     Conjunctiva/sclera: Conjunctivae normal.     Pupils: Pupils are equal, round, and reactive to light.  Cardiovascular:     Rate and Rhythm: Normal rate and regular rhythm.  Pulmonary:     Effort: Pulmonary effort is normal.  Abdominal:     Palpations: Abdomen is soft.  Musculoskeletal:       Hands:     Cervical back: Normal range of motion and neck supple.  Skin:    General: Skin is warm and dry.  Neurological:     Mental Status: He is alert and oriented to person, place, and time.     Cranial Nerves: No cranial nerve deficit.     Motor: No abnormal muscle tone.     Coordination: Coordination normal.     Deep Tendon Reflexes: Reflexes are normal and symmetric. Reflexes normal.  Psychiatric:        Behavior: Behavior normal.        Thought Content: Thought content normal.        Judgment: Judgment normal.           Assessment & Plan:   Encounter Diagnoses  Name Primary?   Joint pain in fingers of right hand Yes   Right wrist pain    He denies gout in the family.  I will get serum uric acid.  I will begin prednisone dose pack.  I will give cock-up splint.  Return in one  week.  Call if any problem.  Precautions discussed.  Electronically Signed Sanjuana Kava, MD 1/4/20248:36 AM

## 2022-09-20 ENCOUNTER — Ambulatory Visit: Payer: Medicare HMO | Admitting: Orthopaedic Surgery

## 2022-10-24 ENCOUNTER — Institutional Professional Consult (permissible substitution): Payer: Medicare HMO | Admitting: Pulmonary Disease

## 2022-10-27 ENCOUNTER — Other Ambulatory Visit: Payer: Self-pay | Admitting: Cardiology

## 2022-12-07 ENCOUNTER — Institutional Professional Consult (permissible substitution): Payer: Medicare HMO | Admitting: Pulmonary Disease

## 2022-12-21 ENCOUNTER — Telehealth: Payer: Self-pay | Admitting: *Deleted

## 2022-12-21 NOTE — Telephone Encounter (Signed)
Patient with diagnosis of Afib  on Eliquis for anticoagulation.    Procedure: Dental Extraction - Amount of Teeth to be Pulled:  5  Date of procedure: TBD   CHA2DS2-VASc Score = 6   This indicates a 9.7% annual risk of stroke. The patient's score is based upon: CHF History: 1 HTN History: 1 Diabetes History: 0 Stroke History: 2 Vascular Disease History: 1 Age Score: 1 Gender Score: 0     CrCl 88 mL/min (06/21/2022) Platelet count: no recent lab on file   Patient does not require pre-op antibiotics for dental procedure.  Per office protocol, patient can hold Eliquis for 24 hours prior to procedure.  Patient is at high risk off anticoagulation due to hx of stroke. Please resume anticoagulation as soon as safely possible.   Patient will not need bridging with Lovenox (enoxaparin) around procedure.  **This guidance is not considered finalized until pre-operative APP has relayed final recommendations.**

## 2022-12-21 NOTE — Telephone Encounter (Signed)
   Pre-operative Risk Assessment    Patient Name: [name]  DOB: [birth year] MRN: [id number]     Request for Surgical Clearance    Procedure:  Dental Extraction - Amount of Teeth to be Pulled:  5  Date of Surgery:  Clearance TBD                                 Surgeon:  Malachi Paradise, DDS Surgeon's Group or Practice Name:  [facility] Phone number:  [phone] Fax number:  [phone]    Type of Clearance Requested:   - Pharmacy:  Hold Apixaban (Eliquis) and contraindications . Patient requires extractions of 5 teeth. Please advise of any contraindications or need to modify eliquis   Type of Anesthesia:  Local , oral sedation or possibly IV conscious sedation.    Additional requests/questions:    Signed, Thalia Bloodgood   12/21/2022, 10:59 AM

## 2022-12-21 NOTE — Telephone Encounter (Signed)
   Patient Name: [name]  DOB: Oct 13, 1955 MRN: [id number]  Primary Cardiologist: Dina Rich, MD  Chart reviewed as part of pre-operative protocol coverage. Pre-op clearance already addressed by colleagues in earlier phone notes. To summarize recommendations:  -Per office protocol, patient can hold Eliquis for 24 hours prior to procedure.  Patient is at high risk off anticoagulation due to hx of stroke. Please resume anticoagulation as soon as safely possible.   Medical clearance not requested.   Will route this bundled recommendation to requesting provider via Epic fax function and remove from pre-op pool. Please call with questions.  Sharlene Dory, PA-C 12/21/2022, 1:15 PM

## 2023-01-16 ENCOUNTER — Other Ambulatory Visit: Payer: Self-pay | Admitting: Cardiology

## 2023-02-03 ENCOUNTER — Other Ambulatory Visit: Payer: Self-pay | Admitting: Cardiology

## 2023-03-02 ENCOUNTER — Ambulatory Visit: Payer: Medicare HMO | Admitting: Cardiology

## 2023-03-02 NOTE — Progress Notes (Deleted)
Clinical Summary Mr. Perezperez is a 67 y.o.male  seen today for follow up of the following medical problems.          1. Chronic HFimpEF - 03/2013 LVEF 40-45%.  - repeat echo 07/2015 LVEF 45-50%, no WMA  - 02/25/14 Lexiscan MPI with fixed basal inferior defect, scar vs attenuation. LVEF 37%, basal inferior hypokinesis.     04/2022 echo: LVEF 55-60%, indet diastolic fxn.  - no SOB/DOE. Occasional LE edema at times. Some recent right leg swelling, taking lasix 20mg  daily.          2. Permanent afib  -no palpitations - no bleeding on eliquis   3. Carotid stenosis  - followed by vascular, history of LICA chronic occlusion  - 02/2019 [place] LICA occluded, RICA moderate  - denies neurological symptoms   04/2022 carotid US: RICA 40-59%, LICA chronic total occlusion   4. Hyperlipidemia  - compliant with statin, labs followed by pcp    - labs followed by pcp   5. OSA screen  - + snore, + obesity. Denies somnolence. + afib, + chf  - he has not scheduled his sleep eval yet but is interested in doing.      6. CVA - stroke 08/2011,. Of not his INR was subtherapeutic at that time.      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 Works as Theatre manager, mainly businesses. Son now works in Engineer, building services Past Medical History:  Diagnosis Date   Angina    Atrial fibrillation [facility])    Atrial flutter ([facility]) 2007   h/o   Blood dyscrasia    Coronary artery disease 08/19/11   Diabetes mellitus    diet controlled   Gilbert's syndrome    Hyperlipidemia    Hypertension    Peripheral vascular disease ([facility])    Stroke ([facility]) ~08/17/11   "lost vision left eye"   Vertigo      Allergies  Allergen Reactions   Other Other (See Comments)    Mayonnaise - irritation in nasal passages if breathe around it    Penicillins Rash     Current Outpatient Medications  Medication Sig Dispense Refill   apixaban (ELIQUIS) 5 MG  TABS tablet Take 1 tablet by mouth twice daily 60 tablet 5   atorvastatin (LIPITOR) 40 MG tablet Take 1 tablet by mouth once daily 90 tablet 1   Cholecalciferol (VITAMIN D3) 125 MCG (5000 UT) CAPS Take 1 capsule by mouth daily.     furosemide (LASIX) 20 MG tablet Take 1 tablet (20 mg total) by mouth daily. May take an additional 20 mg daily as needed for swelling 180 tablet 2   lisinopril (ZESTRIL) 2.5 MG tablet Take 1 tablet by mouth once daily 90 tablet 1   metoprolol succinate (TOPROL-XL) 100 MG 24 hr tablet TAKE 1 TABLET BY MOUTH TWICE DAILY. TAKE WITH OR IMMEDIATELY FOLLOWING A  MEAL. 180 tablet 1   Omega-3 Fatty Acids (FISH OIL) 1000 MG CAPS Take 1 capsule by mouth daily.     predniSONE (STERAPRED UNI-PAK 21 TAB) 5 MG (21) TBPK tablet Take 6 pills first day; 5 pills second day; 4 pills third day; 3 pills fourth day; 2 pills next day and 1 pill last day. 21 tablet 0   sildenafil (VIAGRA) 25 MG tablet 1-2 tablets as needed 1 hr before sexual activity     No current facility-administered medications for this  visit.     Past Surgical History:  Procedure Laterality Date   ACHILLES TENDON REPAIR  06/2002   Left   CARDIOVERSION  2007   CAROTID ANGIOGRAM  08/19/2011   Procedure: CAROTID ANGIOGRAM;  Surgeon: Chuck Hint, MD;  Location: [facility] CATH LAB;  Service: Cardiovascular;;   CEREBRAL ANGIOGRAM N/A 08/19/2011   Procedure: CEREBRAL ANGIOGRAM;  Surgeon: Chuck Hint, MD;  Location: [facility] CATH LAB;  Service: Cardiovascular;  Laterality: N/A;     Allergies  Allergen Reactions   Other Other (See Comments)    Mayonnaise - irritation in nasal passages if breathe around it    Penicillins Rash      Family History  Problem Relation Age of Onset   Diabetes Mother    Hyperlipidemia Mother    Hypertension Mother    Emphysema Father    Heart failure Father    Heart attack Brother    Aneurysm Sister    Deep vein thrombosis Sister    Diabetes Sister    Heart attack Sister       Social History Mr. Schrieber reports that he quit smoking about 11 years ago. His smoking use included cigars and cigarettes. He started smoking about 27 years ago. He has a 0.10 pack-year smoking history. He has never used smokeless tobacco. Mr. Sherrin reports current alcohol use of about 3.0 standard drinks of alcohol per week.   Review of Systems CONSTITUTIONAL: No weight loss, fever, chills, weakness or fatigue.  HEENT: Eyes: No visual loss, blurred vision, double vision or yellow sclerae.No hearing loss, sneezing, congestion, runny nose or sore throat.  SKIN: No rash or itching.  CARDIOVASCULAR:  RESPIRATORY: No shortness of breath, cough or sputum.  GASTROINTESTINAL: No anorexia, nausea, vomiting or diarrhea. No abdominal pain or blood.  GENITOURINARY: No burning on urination, no polyuria NEUROLOGICAL: No headache, dizziness, syncope, paralysis, ataxia, numbness or tingling in the extremities. No change in bowel or bladder control.  MUSCULOSKELETAL: No muscle, back pain, joint pain or stiffness.  LYMPHATICS: No enlarged nodes. No history of splenectomy.  PSYCHIATRIC: No history of depression or anxiety.  ENDOCRINOLOGIC: No reports of sweating, cold or heat intolerance. No polyuria or polydipsia.  Marland Kitchen   Physical Examination There were no vitals filed for this visit. There were no vitals filed for this visit.  Gen: resting comfortably, no acute distress HEENT: no scleral icterus, pupils equal round and reactive, no palptable cervical adenopathy,  CV Resp: Clear to auscultation bilaterally GI: abdomen is soft, non-tender, non-distended, normal bowel sounds, no hepatosplenomegaly MSK: extremities are warm, no edema.  Skin: warm, no rash Neuro:  no focal deficits Psych: appropriate affect   Diagnostic Studies 03/2013 Echo   Study Conclusions  - Left ventricle: Technically difficult study. There was mild concentric hypertrophy. Systolic function was mildly to moderately  reduced. The estimated ejection fraction was in the range of 40% to 45%. Diffuse hypokinesis. The study is not technically sufficient to allow evaluation of LV diastolic function. - Mitral valve: Mildly thickened leaflets . - Left atrium: Moderately dilated. - Right atrium: The atrium was mildly dilated.  02/2014 Lexiscan MPI   Analysis of the raw data did not demonstrate any significant   extracardiac radiotracer uptake. Analysis of the perfusion images   demonstrated a moderate size, moderately intense, basilar inferior   wall defect. Images were worse on rest than stress. Inferior soft   tissue attenuation also noted. Left ventricular systolic function   was moderately reduced, calculated LV EF 37%. The  aforementioned   wall is hypokinetic.   IMPRESSION:   1. Abnormal exercise Cardiolite stress test.   2. Rapid atrial fibrillation noted with exercise.   3. Fixed basal inferior wall defect. While this may represent   myocardial scar, interfering soft tissue attenuation cannot entirely   be excluded.   4. Moderately reduced LV systolic function, calculated LVEF 37%.   07/2015 echo Study Conclusions  - Left ventricle: The cavity size was normal. Wall thickness was   normal. Systolic function was mildly reduced. The estimated   ejection fraction was in the range of 45% to 50%. Wall motion was   normal; there were no regional wall motion abnormalities. - Aortic valve: Valve area (VTI): 2.39 cm^2. Valve area (Vmax):   2.29 cm^2. - Mitral valve: There was mild regurgitation. - Left atrium: The atrium was severely dilated. - Right atrium: The atrium was severely dilated. - Atrial septum: No defect or patent foramen ovale was identified. - Technically adequate study.     11/2015 Carotid US IMPRESSION: Complete occlusion of the left internal carotid artery near its origin.   Moderate eccentric calcified plaque is noted in the right carotid bulb and proximal right internal carotid  artery consistent with 50-69% stenosis based on ultrasound and Doppler criteria.   02/2019 Carotid US Summary:  Right Carotid: Velocities in the right ICA are consistent with a 40-59%                 stenosis.     Vertebrals:  Bilateral vertebral arteries demonstrate antegrade flow.  Subclavians: Normal flow hemodynamics were seen in bilateral subclavian               arteries.      04/2022 echo 1. Left ventricular ejection fraction, by estimation, is 55 to 60%. The  left ventricle has normal function. The left ventricle has no regional  wall motion abnormalities. Left ventricular diastolic parameters are  indeterminate.   2. Right ventricular systolic function is normal. The right ventricular  size is normal. Tricuspid regurgitation signal is inadequate for assessing  PA pressure.   3. Left atrial size was severely dilated.   4. The mitral valve is abnormal. Mild mitral valve regurgitation. No  evidence of mitral stenosis.   5. The aortic valve is tricuspid. Aortic valve regurgitation is not  visualized. No aortic stenosis is present.   6. The inferior vena cava is normal in size with greater than 50%  respiratory variability, suggesting right atrial pressure of 3 mmHg.      04/2022 carotid US Summary:  Right Carotid: Velocities in the right ICA are consistent with a 40-59%                 stenosis.   Left Carotid: Evidence consistent with a total occlusion of the left ICA.   Vertebrals:  Bilateral vertebral arteries demonstrate antegrade flow.  Subclavians: Normal flow hemodynamics were seen in bilateral subclavian               arteries.       Assessment and Plan   1. Chronic HFimpEF -- LVEF has normalized -some recent edema, change lasix to 20mg  daily, take additional 20mg  prn.    2. Afib /acquired thrombophilia - no symptoms, continue current meds including eliquis for stroke prevention   3. Carotid stenosis   -stable disease by recent US, continue to monitor  with repeat US next year   4. HTN - he is at goal, continue current meds  5. OSA screen - signs and symptoms of OSA, refer to pulmonary     [facility] for dental extractions. Can hold eliquis 2 days prior, resume day after     Antoine Poche, M.D., F.A.C.C.

## 2023-03-28 ENCOUNTER — Telehealth: Payer: Self-pay | Admitting: Cardiology

## 2023-03-28 NOTE — Telephone Encounter (Signed)
Patient needs documentation from his doctor showing he can carry his medication with him. Traveling soon and would need this information.

## 2023-03-29 ENCOUNTER — Encounter: Payer: Self-pay | Admitting: Cardiology

## 2023-03-29 NOTE — Telephone Encounter (Signed)
Letter Printed and given to patient at his appointment

## 2023-03-30 ENCOUNTER — Encounter: Payer: Self-pay | Admitting: Nurse Practitioner

## 2023-03-30 ENCOUNTER — Ambulatory Visit: Payer: Medicare HMO | Attending: Nurse Practitioner | Admitting: Nurse Practitioner

## 2023-03-30 VITALS — BP 128/84 | HR 91 | Ht 73.0 in | Wt 272.0 lb

## 2023-03-30 DIAGNOSIS — Z8673 Personal history of transient ischemic attack (TIA), and cerebral infarction without residual deficits: Secondary | ICD-10-CM | POA: Diagnosis not present

## 2023-03-30 DIAGNOSIS — I6521 Occlusion and stenosis of right carotid artery: Secondary | ICD-10-CM | POA: Diagnosis not present

## 2023-03-30 DIAGNOSIS — I6522 Occlusion and stenosis of left carotid artery: Secondary | ICD-10-CM | POA: Diagnosis not present

## 2023-03-30 DIAGNOSIS — I10 Essential (primary) hypertension: Secondary | ICD-10-CM

## 2023-03-30 DIAGNOSIS — E785 Hyperlipidemia, unspecified: Secondary | ICD-10-CM | POA: Diagnosis not present

## 2023-03-30 DIAGNOSIS — I5032 Chronic diastolic (congestive) heart failure: Secondary | ICD-10-CM

## 2023-03-30 DIAGNOSIS — Z79899 Other long term (current) drug therapy: Secondary | ICD-10-CM

## 2023-03-30 DIAGNOSIS — I4821 Permanent atrial fibrillation: Secondary | ICD-10-CM

## 2023-03-30 NOTE — Progress Notes (Signed)
Cardiology Office Note:  .   Date:  03/30/2023  ID:  [name], DOB 12/11/1955, MRN [id number] PCP: Tanna Furry, MD  [facility] Providers Cardiologist:  Dina Rich, MD    History of Present Illness: .   [name] is a very pleasant 67 y.o. male with PMH of chronic HFimpEF, permanent A-fib, carotid artery stenosis, HTN, HLD, past hx of CVA, who presents today for 6 month follow-up.   Last seen by Dr. Dina Rich on August 24, 2022. Pt had noticed some recent edema. Lasix was changed to 20 mg daily, with instructions to take additional 20 mg PRN. Was referred to Pulmonology to be screened for OSA.   Today he presents for 6 month follow-up. He states he is doing well from a cardiac perspective.  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  He does admit to some leg swelling, says it goes down by the end of the day.  Around 1 month ago, he had very painful sensation along the bottom of his left foot, says he could not even stand up or put weight on his foot, was not sure if this was gout or due to neuropathy or was also thinking about plantar fasciitis. Denies any chest pain, shortness of breath, palpitations, syncope, presyncope, dizziness, orthopnea, PND, significant weight changes, acute bleeding, or claudication.  SH: Retired Photographer  Studies Reviewed: Marland Kitchen    EKG:  EKG Interpretation Date/Time:  Thursday March 30 2023 08:41:00 EDT Ventricular Rate:  76 PR Interval:    QRS Duration:  84 QT Interval:  366 QTC Calculation: 411 R Axis:   23  Text Interpretation: Atrial fibrillation When compared with ECG of 03-Jun-2015 22:20, No significant change was found Confirmed by Sharlene Dory [phone]) on 03/30/2023 8:44:15 AM   Echo 04/2022:   1. Left ventricular ejection fraction, by estimation, is 55 to 60%. The  left ventricle has normal function. The left ventricle has no regional  wall motion  abnormalities. Left ventricular diastolic parameters are  indeterminate.   2. Right ventricular systolic function is normal. The right ventricular  size is normal. Tricuspid regurgitation signal is inadequate for assessing  PA pressure.   3. Left atrial size was severely dilated.   4. The mitral valve is abnormal. Mild mitral valve regurgitation. No  evidence of mitral stenosis.   5. The aortic valve is tricuspid. Aortic valve regurgitation is not  visualized. No aortic stenosis is present.   6. The inferior vena cava is normal in size with greater than 50%  respiratory variability, suggesting right atrial pressure of 3 mmHg.   Comparison(s): Echocardiogram done 07/10/15 showed an EF of 45-50%.  Carotid Doppler 04/2022: Summary:  Right Carotid: Velocities in the right ICA are consistent with a 40-59% stenosis.   Left Carotid: Evidence consistent with a total occlusion of the left ICA.   Vertebrals:  Bilateral vertebral arteries demonstrate antegrade flow.  Subclavians: Normal flow hemodynamics were seen in bilateral subclavian arteries.  Risk Assessment/Calculations:    CHA2DS2-VASc Score = 6  This indicates a 9.7% annual risk of stroke. The patient's score is based upon: CHF History: 1 HTN History: 1 Diabetes History: 0 Stroke History: 2 Vascular Disease History: 1 Age Score: 1 Gender Score: 0   Physical Exam:   VS:  BP 128/84   Pulse 91   Ht 6\' 1"  (1.854 m)   Wt 272 lb (123.4 kg)   SpO2  96%   BMI 35.89 kg/m    Wt Readings from Last 3 Encounters:  03/30/23 272 lb (123.4 kg)  09/08/22 270 lb (122.5 kg)  08/24/22 276 lb 3.2 oz (125.3 kg)    GEN: Obese, 67 year old male in no acute distress NECK: No JVD; No carotid bruits CARDIAC: S1/S2, irregularly irregular, no murmurs, rubs, gallops RESPIRATORY:  Clear to auscultation without rales, wheezing or rhonchi  ABDOMEN: Soft, non-tender, non-distended EXTREMITIES:  Nonpitting BLE edema; No deformity   ASSESSMENT AND  PLAN: .    HFimpEF Stage C, NYHA class I symptoms. Echo 04/2022 EF 55-60%. Euvolemic and well compensated on exam. Continue Toprol XL and Lasix PRN. Low sodium diet, fluid restriction <2L, and daily weights encouraged. Educated to contact our office for weight gain of 2 lbs overnight or 5 lbs in one week.  2. Permanent A-fib Denies any tachycardia or palpitations. HR well controlled. Continue Toprol XL.  Continue Eliquis for CHA2DS2-VASc score of 6.  He is currently on appropriate dosage, he denies any bleeding issues.  Will obtain CBC and CMET. Heart healthy diet and regular cardiovascular exercise encouraged.   3. Right ICA stenosis, left ICA total occlusion, HLD, medication management Carotid Doppler 04/2022 revealed right ICA stenosis at 40 to 59% with evidence consistent with total occlusion of left ICA.  Dr. Wyline Mood commented that there was no significant change from last study.  Recommended updating and repeating study for 2024.  Will arrange carotid Doppler to be performed for next month.  Will obtain fasting lipid panel and CMET in 1 week.  Continue atorvastatin. Heart healthy diet and regular cardiovascular exercise encouraged.   4. HTN Blood pressure stable. Discussed to monitor BP at home at least 2 hours after medications and sitting for 5-10 minutes.  No medication changes at this time.  Obtaining labs as mentioned above. Heart healthy diet and regular cardiovascular exercise encouraged.   5. Past history of CVA Denies any symptoms.  Not on aspirin due to being on Eliquis.  Continue atorvastatin.  Obtaining labs as mentioned above.  Continue to follow with PCP.  Dispo: Follow-up with Dr. Dina Rich or APP in 6 months or sooner if anything changes.  Signed, Sharlene Dory, NP

## 2023-03-30 NOTE — Patient Instructions (Addendum)
Medication Instructions:  Your physician recommends that you continue on your current medications as directed. Please refer to the Current Medication list given to you today.   Labwork: CBC, CMET, FLP in one week at [facility]  Testing/Procedures: Your physician has requested that you have a carotid duplex. This test is an ultrasound of the carotid arteries in your neck. It looks at blood flow through these arteries that supply the brain with blood. Allow one hour for this exam. There are no restrictions or special instructions.   Follow-Up: Your physician recommends that you schedule a follow-up appointment in: 6 months  Any Other Special Instructions Will Be Listed Below (If Applicable).  If you need a refill on your cardiac medications before your next appointment, please call your pharmacy.

## 2023-05-03 ENCOUNTER — Ambulatory Visit: Payer: Medicare HMO | Attending: Nurse Practitioner

## 2023-05-03 DIAGNOSIS — I6521 Occlusion and stenosis of right carotid artery: Secondary | ICD-10-CM | POA: Diagnosis not present

## 2023-05-09 ENCOUNTER — Encounter: Payer: Medicare HMO | Admitting: Orthopaedic Surgery

## 2023-05-17 ENCOUNTER — Ambulatory Visit: Payer: Medicare HMO | Admitting: Orthopaedic Surgery

## 2023-05-17 ENCOUNTER — Other Ambulatory Visit (INDEPENDENT_AMBULATORY_CARE_PROVIDER_SITE_OTHER): Payer: Medicare HMO

## 2023-05-17 ENCOUNTER — Encounter: Payer: Self-pay | Admitting: Orthopaedic Surgery

## 2023-05-17 DIAGNOSIS — E559 Vitamin D deficiency, unspecified: Secondary | ICD-10-CM | POA: Insufficient documentation

## 2023-05-17 DIAGNOSIS — M25561 Pain in right knee: Secondary | ICD-10-CM

## 2023-05-17 DIAGNOSIS — M25562 Pain in left knee: Secondary | ICD-10-CM | POA: Diagnosis not present

## 2023-05-17 DIAGNOSIS — G8929 Other chronic pain: Secondary | ICD-10-CM

## 2023-05-17 DIAGNOSIS — I6523 Occlusion and stenosis of bilateral carotid arteries: Secondary | ICD-10-CM | POA: Insufficient documentation

## 2023-05-17 DIAGNOSIS — E782 Mixed hyperlipidemia: Secondary | ICD-10-CM | POA: Insufficient documentation

## 2023-05-17 DIAGNOSIS — I10 Essential (primary) hypertension: Secondary | ICD-10-CM | POA: Insufficient documentation

## 2023-05-17 DIAGNOSIS — M1712 Unilateral primary osteoarthritis, left knee: Secondary | ICD-10-CM

## 2023-05-17 DIAGNOSIS — Z8673 Personal history of transient ischemic attack (TIA), and cerebral infarction without residual deficits: Secondary | ICD-10-CM | POA: Insufficient documentation

## 2023-05-17 DIAGNOSIS — N529 Male erectile dysfunction, unspecified: Secondary | ICD-10-CM | POA: Insufficient documentation

## 2023-05-17 DIAGNOSIS — Z720 Tobacco use: Secondary | ICD-10-CM | POA: Insufficient documentation

## 2023-05-17 DIAGNOSIS — H5440 Blindness, one eye, unspecified eye: Secondary | ICD-10-CM | POA: Insufficient documentation

## 2023-05-17 DIAGNOSIS — R7303 Prediabetes: Secondary | ICD-10-CM | POA: Insufficient documentation

## 2023-05-17 DIAGNOSIS — I5022 Chronic systolic (congestive) heart failure: Secondary | ICD-10-CM | POA: Insufficient documentation

## 2023-05-17 DIAGNOSIS — M1711 Unilateral primary osteoarthritis, right knee: Secondary | ICD-10-CM | POA: Diagnosis not present

## 2023-05-17 MED ORDER — NAPROXEN 500 MG PO TABS: 500.0000 mg | ORAL_TABLET | Freq: Two times a day (BID) | ORAL | 5 refills | Status: DC

## 2023-05-17 NOTE — Progress Notes (Signed)
My knees hurt.  He has pain in both knees, more on the right than the left.  He has crepitus.  He has to stand still for a few seconds after getting up from a seated position before he can walk.  He has no trauma.  He has had menisectomy on the left knee in the past and post Achilles tear repair on the left as well.  He had taken Naprosyn but is not taking it now.  He has chronic edema of both feet and legs.  He has no giving way, no redness.  He does have gout.  Right knee has ROM 0 to 110, stable, more lateral posterior knee pain, crepitus, very minimal effusion, NV intact, distal edema.  Left knee ROM is also 0 to 110, stable, more medial tenderness, crepitus and minimal effusion, NV intact, distal edema.  X-rays were done of both knees, reported separately.  Encounter Diagnoses  Name Primary?   Chronic pain of right knee Yes   Chronic pain of left knee    I will call in Naprosyn.  I have given knee brace.  I will see him as needed.  Call if any problem.  Precautions discussed.  Electronically Signed Darreld Mclean, MD 9/11/20248:59 AM

## 2023-05-18 ENCOUNTER — Other Ambulatory Visit: Payer: Self-pay | Admitting: Cardiology

## 2023-06-14 DIAGNOSIS — K08131 Complete loss of teeth due to caries, class I: Secondary | ICD-10-CM | POA: Diagnosis not present

## 2023-06-20 ENCOUNTER — Other Ambulatory Visit: Payer: Self-pay | Admitting: Cardiology

## 2023-06-20 MED ORDER — APIXABAN 5 MG PO TABS: 5.0000 mg | ORAL_TABLET | Freq: Two times a day (BID) | ORAL | 5 refills | Status: DC

## 2023-07-25 ENCOUNTER — Other Ambulatory Visit: Payer: Self-pay | Admitting: Cardiology

## 2023-08-16 ENCOUNTER — Other Ambulatory Visit: Payer: Self-pay | Admitting: Cardiology

## 2023-08-16 ENCOUNTER — Ambulatory Visit: Payer: Self-pay | Admitting: Family Medicine

## 2023-08-22 ENCOUNTER — Encounter: Payer: Self-pay | Admitting: Family Medicine

## 2023-09-18 ENCOUNTER — Other Ambulatory Visit: Payer: Self-pay | Admitting: Cardiology

## 2023-10-03 ENCOUNTER — Encounter: Payer: Self-pay | Admitting: Internal Medicine

## 2023-10-03 ENCOUNTER — Ambulatory Visit (INDEPENDENT_AMBULATORY_CARE_PROVIDER_SITE_OTHER): Payer: Medicare HMO | Admitting: Internal Medicine

## 2023-10-03 VITALS — BP 135/79 | HR 92 | Ht 73.0 in | Wt 266.4 lb

## 2023-10-03 DIAGNOSIS — I10 Essential (primary) hypertension: Secondary | ICD-10-CM

## 2023-10-03 DIAGNOSIS — Z720 Tobacco use: Secondary | ICD-10-CM | POA: Diagnosis not present

## 2023-10-03 DIAGNOSIS — R7303 Prediabetes: Secondary | ICD-10-CM | POA: Diagnosis not present

## 2023-10-03 DIAGNOSIS — Z8673 Personal history of transient ischemic attack (TIA), and cerebral infarction without residual deficits: Secondary | ICD-10-CM

## 2023-10-03 DIAGNOSIS — E559 Vitamin D deficiency, unspecified: Secondary | ICD-10-CM | POA: Diagnosis not present

## 2023-10-03 DIAGNOSIS — I5022 Chronic systolic (congestive) heart failure: Secondary | ICD-10-CM | POA: Diagnosis not present

## 2023-10-03 DIAGNOSIS — E785 Hyperlipidemia, unspecified: Secondary | ICD-10-CM

## 2023-10-03 DIAGNOSIS — Z1159 Encounter for screening for other viral diseases: Secondary | ICD-10-CM

## 2023-10-03 DIAGNOSIS — Z23 Encounter for immunization: Secondary | ICD-10-CM | POA: Diagnosis not present

## 2023-10-03 DIAGNOSIS — E782 Mixed hyperlipidemia: Secondary | ICD-10-CM

## 2023-10-03 DIAGNOSIS — I4821 Permanent atrial fibrillation: Secondary | ICD-10-CM

## 2023-10-03 DIAGNOSIS — Z1211 Encounter for screening for malignant neoplasm of colon: Secondary | ICD-10-CM | POA: Insufficient documentation

## 2023-10-03 DIAGNOSIS — N529 Male erectile dysfunction, unspecified: Secondary | ICD-10-CM | POA: Diagnosis not present

## 2023-10-03 DIAGNOSIS — Z7901 Long term (current) use of anticoagulants: Secondary | ICD-10-CM | POA: Diagnosis not present

## 2023-10-03 NOTE — Assessment & Plan Note (Signed)
Adequately controlled on current antihypertensive regimen, which consists of lisinopril 2.5 mg daily and Toprol-XL 100 mg twice daily.

## 2023-10-03 NOTE — Assessment & Plan Note (Signed)
Symptoms are adequately controlled with as needed use of sildenafil.

## 2023-10-03 NOTE — Assessment & Plan Note (Signed)
A1c 6.0 on labs from November 2021.  He denies any history of diabetes mellitus.  Repeat A1c ordered today.

## 2023-10-03 NOTE — Assessment & Plan Note (Signed)
EF recovered on latest TTE.  Euvolemic on exam today.  He is prescribed Toprol-XL, Lasix, and lisinopril.

## 2023-10-03 NOTE — Assessment & Plan Note (Signed)
Gastroenterology referral placed today for screening colonoscopy.  Patient reports that he has never been screened for colon cancer.  Denies a family history of colon cancer.

## 2023-10-03 NOTE — Progress Notes (Signed)
New Patient Office Visit  Subjective    Patient ID: [name], male    DOB: 02-Oct-1955  Age: 68 y.o. MRN: [id number]  CC:  Chief Complaint  Patient presents with   Establish Care    HPI [name] presents to establish care.  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  Previously followed at [facility]: Lambert Rhonda W.  Mr. Brittian reports feeling well today.  He is asymptomatic and has no acute concerns to discuss aside from desiring to establish care.  He is currently retired and previously worked as a Photographer.  He reports smoking 2 black and mild cigars weekly.  He drinks 1 beer weekly and has 1 shot of whiskey per week.  Denies illicit drug use.  Family medical history significant for diabetes mellitus, ESRD, prostate cancer, and cervical cancer.  Chronic medical conditions and outstanding preventative care items discussed today are individually addressed in A/P below.   Outpatient Encounter Medications as of 10/03/2023  Medication Sig   apixaban (ELIQUIS) 5 MG TABS tablet Take 1 tablet (5 mg total) by mouth 2 (two) times daily.   atorvastatin (LIPITOR) 40 MG tablet Take 1 tablet by mouth once daily   Cholecalciferol (VITAMIN D3) 125 MCG (5000 UT) CAPS Take 1 capsule by mouth daily.   furosemide (LASIX) 20 MG tablet TAKE 1 TABLET BY MOUTH ONCE DAILY. MAY TAKE AN ADDITIONAL 20 MG DAILY AS NEEDED FOR SWELLING - DOSE CHANGE   lisinopril (ZESTRIL) 2.5 MG tablet Take 1 tablet by mouth once daily   metoprolol succinate (TOPROL-XL) 100 MG 24 hr tablet TAKE 1 TABLET BY MOUTH TWICE DAILY. TAKE WITH OR IMMEDIATELY FOLLOWING A  MEAL.   naproxen (NAPROSYN) 500 MG tablet Take 1 tablet (500 mg total) by mouth 2 (two) times daily with a  meal.   Omega-3 Fatty Acids (FISH OIL) 1000 MG CAPS Take 1 capsule by mouth daily.   sildenafil (VIAGRA) 25 MG tablet 1-2 tablets as needed 1 hr before sexual activity   No facility-administered encounter medications on file as of 10/03/2023.   Past Medical History:  Diagnosis Date   Angina    Atrial fibrillation (HCC)    Atrial flutter (HCC) 2007   h/o   Blood dyscrasia    Coronary artery disease 08/19/11   Diabetes mellitus    diet controlled   Gilbert's syndrome    Hyperlipidemia    Hypertension    Peripheral vascular disease (HCC)    Stroke (HCC) ~08/17/11   "lost vision left eye"   Vertigo    Past Surgical History:  Procedure Laterality Date   ACHILLES TENDON REPAIR  06/2002   Left   CARDIOVERSION  2007   CAROTID ANGIOGRAM  08/19/2011   Procedure: CAROTID ANGIOGRAM;  Surgeon: Chuck Hint, MD;  Location: [facility] CATH LAB;  Service: Cardiovascular;;   CEREBRAL ANGIOGRAM N/A 08/19/2011   Procedure: CEREBRAL ANGIOGRAM;  Surgeon: Chuck Hint, MD;  Location: [facility] CATH LAB;  Service: Cardiovascular;  Laterality: N/A;   Family History  Problem Relation Age of Onset   Diabetes Mother    Hyperlipidemia Mother    Hypertension Mother    Emphysema Father    Heart failure Father    Heart attack Brother    Aneurysm Sister  Deep vein thrombosis Sister    Diabetes Sister    Heart attack Sister    Social History   Socioeconomic History   Marital status: Married    Spouse name: Not on file   Number of children: Not on file   Years of education: Not on file   Highest education level: Not on file  Occupational History   Not on file  Tobacco Use   Smoking status: Former    Current packs/day: 0.00    Average packs/day: (0.2 ttl pk-yrs)    Types: Cigars, Cigarettes    Start date: 01/04/1996    Quit date: 08/16/2011    Years since quitting: 12.1   Smokeless tobacco: Never  Vaping Use   Vaping status: Never Used  Substance and Sexual Activity   Alcohol use:  Yes    Alcohol/week: 3.0 standard drinks of alcohol    Types: 3 Cans of beer per week   Drug use: No   Sexual activity: Yes    Birth control/protection: Condom  Other Topics Concern   Not on file  Social History Narrative   Not on file   Social Drivers of Health   Financial Resource Strain: Not on file  Food Insecurity: Not on file  Transportation Needs: Not on file  Physical Activity: Not on file  Stress: Not on file  Social Connections: Not on file  Intimate Partner Violence: Not on file   Review of Systems  Constitutional:  Negative for chills and fever.  HENT:  Negative for sore throat.   Respiratory:  Negative for cough and shortness of breath.   Cardiovascular:  Negative for chest pain, palpitations and leg swelling.  Gastrointestinal:  Negative for abdominal pain, blood in stool, constipation, diarrhea, nausea and vomiting.  Genitourinary:  Negative for dysuria and hematuria.  Musculoskeletal:  Negative for myalgias.  Skin:  Negative for itching and rash.  Neurological:  Negative for dizziness and headaches.  Psychiatric/Behavioral:  Negative for depression and suicidal ideas.    Objective    BP 135/79 (BP Location: Left Arm, Patient Position: Sitting, Cuff Size: Large)   Pulse 92   Ht 6\' 1"  (1.854 m)   Wt 266 lb 6.4 oz (120.8 kg)   SpO2 97%   BMI 35.15 kg/m   Physical Exam Vitals reviewed.  Constitutional:      General: He is not in acute distress.    Appearance: Normal appearance. He is obese. He is not ill-appearing.  HENT:     Head: Normocephalic and atraumatic.     Right Ear: External ear normal.     Left Ear: External ear normal.     Nose: Nose normal. No congestion or rhinorrhea.     Mouth/Throat:     Mouth: Mucous membranes are moist.     Pharynx: Oropharynx is clear.  Eyes:     General: No scleral icterus.    Extraocular Movements: Extraocular movements intact.     Conjunctiva/sclera: Conjunctivae normal.     Pupils: Pupils are equal, round,  and reactive to light.  Cardiovascular:     Rate and Rhythm: Normal rate. Rhythm irregular.     Pulses: Normal pulses.     Heart sounds: Normal heart sounds. No murmur heard. Pulmonary:     Effort: Pulmonary effort is normal.     Breath sounds: Normal breath sounds. No wheezing, rhonchi or rales.  Abdominal:     General: Abdomen is flat. Bowel sounds are normal. There is no distension.  Palpations: Abdomen is soft.     Tenderness: There is no abdominal tenderness.  Musculoskeletal:        General: No swelling or deformity. Normal range of motion.     Cervical back: Normal range of motion.  Skin:    General: Skin is warm and dry.     Capillary Refill: Capillary refill takes less than 2 seconds.  Neurological:     General: No focal deficit present.     Mental Status: He is alert and oriented to person, place, and time.     Motor: No weakness.  Psychiatric:        Mood and Affect: Mood normal.        Behavior: Behavior normal.        Thought Content: Thought content normal.    Assessment & Plan:   Problem List Items Addressed This Visit       Chronic systolic congestive heart failure (HCC)   EF recovered on latest TTE.  Euvolemic on exam today.  He is prescribed Toprol-XL, Lasix, and lisinopril.      Hypertension, benign - Primary   Adequately controlled on current antihypertensive regimen, which consists of lisinopril 2.5 mg daily and Toprol-XL 100 mg twice daily.      Permanent atrial fibrillation (HCC)   Closely followed by cardiology.  Irregularly irregular rate and rhythm detected on exam today.  Currently prescribed Eliquis 5 mg twice daily and Toprol-XL 100 mg twice daily.      Chronic anticoagulation   He is prescribed Eliquis 5 mg twice daily in the setting of permanent atrial fibrillation with a history of CVA.  With this in mind, he was cautioned on chronic NSAID use.      Erectile dysfunction   Symptoms are adequately controlled with as needed use of  sildenafil.      History of CVA (cerebrovascular accident)   History of CVA in 2012 resulting in blindness in the left eye.  He has known complete total occlusion of the left ICA and partial occlusion of the right ICA.  Currently prescribed atorvastatin and Eliquis.      Mixed hyperlipidemia   Currently prescribed atorvastatin 40 mg daily.  Repeat lipid panel ordered today.      Prediabetes   A1c 6.0 on labs from November 2021.  He denies any history of diabetes mellitus.  Repeat A1c ordered today.      Need for influenza vaccination   Influenza vaccine administered today      Colon cancer screening   Gastroenterology referral placed today for screening colonoscopy.  Patient reports that he has never been screened for colon cancer.  Denies a family history of colon cancer.      Return in about 3 months (around 01/01/2024).   Billie Lade, MD

## 2023-10-03 NOTE — Assessment & Plan Note (Signed)
Currently prescribed atorvastatin 40 mg daily.  Repeat lipid panel ordered today.

## 2023-10-03 NOTE — Assessment & Plan Note (Signed)
History of CVA in 2012 resulting in blindness in the left eye.  He has known complete total occlusion of the left ICA and partial occlusion of the right ICA.  Currently prescribed atorvastatin and Eliquis.

## 2023-10-03 NOTE — Assessment & Plan Note (Signed)
Closely followed by cardiology.  Irregularly irregular rate and rhythm detected on exam today.  Currently prescribed Eliquis 5 mg twice daily and Toprol-XL 100 mg twice daily.

## 2023-10-03 NOTE — Patient Instructions (Signed)
It was a pleasure to see you today.  Thank you for giving [place] the opportunity to be involved in your care.  Below is a brief recap of your visit and next steps.  We will plan to see you again in 3 months.  Summary You have established care today No medication changes were made Repeat labs ordered GI referral placed for colonoscopy Flu shot today Follow up in 3 months for routine care

## 2023-10-03 NOTE — Assessment & Plan Note (Signed)
He is prescribed Eliquis 5 mg twice daily in the setting of permanent atrial fibrillation with a history of CVA.  With this in mind, he was cautioned on chronic NSAID use.

## 2023-10-03 NOTE — Assessment & Plan Note (Signed)
Influenza vaccine administered today.

## 2023-10-04 LAB — HEMOGLOBIN A1C
Est. average glucose Bld gHb Est-mCnc: 117 mg/dL
Hgb A1c MFr Bld: 5.7 % — ABNORMAL HIGH (ref 4.8–5.6)

## 2023-10-04 LAB — CBC WITH DIFFERENTIAL/PLATELET
Basophils Absolute: 0 10*3/uL (ref 0.0–0.2)
Basos: 1 %
EOS (ABSOLUTE): 0.2 10*3/uL (ref 0.0–0.4)
Eos: 4 %
Hematocrit: 43.5 % (ref 37.5–51.0)
Hemoglobin: 13.7 g/dL (ref 13.0–17.7)
Immature Grans (Abs): 0 10*3/uL (ref 0.0–0.1)
Immature Granulocytes: 0 %
Lymphocytes Absolute: 1.1 10*3/uL (ref 0.7–3.1)
Lymphs: 19 %
MCH: 29 pg (ref 26.6–33.0)
MCHC: 31.5 g/dL (ref 31.5–35.7)
MCV: 92 fL (ref 79–97)
Monocytes Absolute: 0.5 10*3/uL (ref 0.1–0.9)
Monocytes: 9 %
Neutrophils Absolute: 4.1 10*3/uL (ref 1.4–7.0)
Neutrophils: 67 %
Platelets: 190 10*3/uL (ref 150–450)
RBC: 4.72 x10E6/uL (ref 4.14–5.80)
RDW: 13.4 % (ref 11.6–15.4)
WBC: 6.1 10*3/uL (ref 3.4–10.8)

## 2023-10-04 LAB — CMP14+EGFR
ALT: 21 [IU]/L (ref 0–44)
AST: 19 [IU]/L (ref 0–40)
Albumin: 3.8 g/dL — ABNORMAL LOW (ref 3.9–4.9)
Alkaline Phosphatase: 79 [IU]/L (ref 44–121)
BUN/Creatinine Ratio: 13 (ref 10–24)
BUN: 15 mg/dL (ref 8–27)
Bilirubin Total: 1.1 mg/dL (ref 0.0–1.2)
CO2: 21 mmol/L (ref 20–29)
Calcium: 10.3 mg/dL — ABNORMAL HIGH (ref 8.6–10.2)
Chloride: 105 mmol/L (ref 96–106)
Creatinine, Ser: 1.13 mg/dL (ref 0.76–1.27)
Globulin, Total: 2.9 g/dL (ref 1.5–4.5)
Glucose: 96 mg/dL (ref 70–99)
Potassium: 4.5 mmol/L (ref 3.5–5.2)
Sodium: 141 mmol/L (ref 134–144)
Total Protein: 6.7 g/dL (ref 6.0–8.5)
eGFR: 71 mL/min/{1.73_m2} (ref >59)

## 2023-10-04 LAB — LIPID PANEL
Chol/HDL Ratio: 3.2 {ratio} (ref 0.0–5.0)
Cholesterol, Total: 139 mg/dL (ref 100–199)
HDL: 43 mg/dL (ref >39)
LDL Chol Calc (NIH): 83 mg/dL (ref 0–99)
Triglycerides: 62 mg/dL (ref 0–149)
VLDL Cholesterol Cal: 13 mg/dL (ref 5–40)

## 2023-10-04 LAB — B12 AND FOLATE PANEL
Folate: 5.5 ng/mL (ref >3.0)
Vitamin B-12: 447 pg/mL (ref 232–1245)

## 2023-10-04 LAB — VITAMIN D 25 HYDROXY (VIT D DEFICIENCY, FRACTURES): Vit D, 25-Hydroxy: 13.8 ng/mL — ABNORMAL LOW (ref 30.0–100.0)

## 2023-10-04 LAB — HCV INTERPRETATION

## 2023-10-04 LAB — HCV AB W REFLEX TO QUANT PCR: HCV Ab: NONREACTIVE

## 2023-10-04 LAB — TSH+FREE T4
Free T4: 1.18 ng/dL (ref 0.82–1.77)
TSH: 2.06 u[IU]/mL (ref 0.450–4.500)

## 2023-10-26 ENCOUNTER — Ambulatory Visit: Payer: Medicare HMO | Admitting: Internal Medicine

## 2023-11-01 ENCOUNTER — Ambulatory Visit: Payer: Medicare HMO | Admitting: Internal Medicine

## 2023-11-06 ENCOUNTER — Encounter: Payer: Self-pay | Admitting: Internal Medicine

## 2023-11-15 ENCOUNTER — Encounter: Payer: Self-pay | Admitting: Internal Medicine

## 2023-11-15 ENCOUNTER — Ambulatory Visit: Admitting: Internal Medicine

## 2023-11-16 ENCOUNTER — Ambulatory Visit: Admitting: Internal Medicine

## 2023-11-16 VITALS — BP 129/72 | HR 106 | Temp 97.9°F | Ht 73.0 in | Wt 258.8 lb

## 2023-11-16 DIAGNOSIS — K5904 Chronic idiopathic constipation: Secondary | ICD-10-CM

## 2023-11-16 DIAGNOSIS — Z7901 Long term (current) use of anticoagulants: Secondary | ICD-10-CM | POA: Diagnosis not present

## 2023-11-16 DIAGNOSIS — K5909 Other constipation: Secondary | ICD-10-CM

## 2023-11-16 DIAGNOSIS — Z1211 Encounter for screening for malignant neoplasm of colon: Secondary | ICD-10-CM

## 2023-11-16 NOTE — Patient Instructions (Signed)
 We will schedule you for colonoscopy for colon cancer screening purposes.  For your constipation, I want you to start taking over the counter MiraLAX 1 capful daily.  If this does not adequately control your constipation, I would increase to 2 capfuls daily.  If this is still not adequate, then I would add on once daily Dulcolax (bisacodyl) tablet.   I also recommend increasing fiber in your diet or adding OTC Benefiber. Be sure to drink at least 4 to 6 glasses of water daily.   Is very nice meeting you today.  Dr. Marletta Lor

## 2023-11-17 ENCOUNTER — Encounter: Payer: Self-pay | Admitting: *Deleted

## 2023-11-17 ENCOUNTER — Other Ambulatory Visit: Payer: Self-pay | Admitting: *Deleted

## 2023-11-17 MED ORDER — PEG 3350-KCL-NA BICARB-NACL 420 G PO SOLR: 4000.0000 mL | Freq: Once | ORAL | 0 refills | Status: AC

## 2023-11-20 ENCOUNTER — Telehealth: Payer: Self-pay | Admitting: *Deleted

## 2023-11-20 NOTE — Telephone Encounter (Signed)
 Spoke with pt and he is aware of pre-op appointment details.

## 2023-12-03 NOTE — Progress Notes (Signed)
 Primary Care Physician:  Billie Lade, MD Primary Gastroenterologist:  Dr. Marletta Lor  Chief Complaint  Patient presents with   New Patient (Initial Visit)    Pt referred for ov due to blood thinners. Colonoscopy visit    HPI:   [name] is a 68 y.o. male who presents to clinic today by referral from his PCP Dr. Durwin Nora to discuss screening colonoscopy. 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  Colon cancer screening: No prior colonoscopy.  Denies any family history of colorectal malignancy.  No melena hematochezia.  No unintentional weight loss.  Chronically takes apixaban for atrial fibrillation.  Chronic constipation: Mild intermittent, occasional straining.  Has not take anything for this.  Bowel movement every 2 to 3 days.  Denies any associated abdominal pain.  Denies any upper GI symptoms including heartburn, reflux, epigastric or chest pain, dysphagia/odynophagia.   Past Medical History:  Diagnosis Date   Angina    Atrial fibrillation (HCC)    Atrial flutter (HCC) 2007   h/o   Blood dyscrasia    Coronary artery disease 08/19/11   Diabetes mellitus    diet controlled   Gilbert's syndrome    Hyperlipidemia    Hypertension    Peripheral vascular disease (HCC)    Stroke (HCC) ~08/17/11   "lost vision left eye"   Vertigo     Past Surgical History:  Procedure Laterality Date   ACHILLES TENDON REPAIR  06/2002   Left   CARDIOVERSION  2007   CAROTID ANGIOGRAM  08/19/2011   Procedure: CAROTID ANGIOGRAM;  Surgeon: Chuck Hint, MD;  Location: [facility] CATH LAB;  Service: Cardiovascular;;   CEREBRAL ANGIOGRAM N/A 08/19/2011   Procedure: CEREBRAL ANGIOGRAM;  Surgeon: Chuck Hint, MD;  Location: [facility] CATH LAB;  Service: Cardiovascular;  Laterality: N/A;    Current Outpatient Medications  Medication Sig  Dispense Refill   apixaban (ELIQUIS) 5 MG TABS tablet Take 1 tablet (5 mg total) by mouth 2 (two) times daily. 60 tablet 5   atorvastatin (LIPITOR) 40 MG tablet Take 1 tablet by mouth once daily 90 tablet 0   Cholecalciferol (VITAMIN D3) 125 MCG (5000 UT) CAPS Take 1 capsule by mouth daily.     furosemide (LASIX) 20 MG tablet TAKE 1 TABLET BY MOUTH ONCE DAILY. MAY TAKE AN ADDITIONAL 20 MG DAILY AS NEEDED FOR SWELLING - DOSE CHANGE 180 tablet 0   lisinopril (ZESTRIL) 2.5 MG tablet Take 1 tablet by mouth once daily 90 tablet 1   metoprolol succinate (TOPROL-XL) 100 MG 24 hr tablet TAKE 1 TABLET BY MOUTH TWICE DAILY. TAKE WITH OR IMMEDIATELY FOLLOWING A  MEAL. 180 tablet 1   naproxen (NAPROSYN) 500 MG tablet Take 1 tablet (500 mg total) by mouth 2 (two) times daily with a meal. 60 tablet 5   Omega-3 Fatty Acids (FISH OIL) 1000 MG CAPS Take 1 capsule by mouth daily.     sildenafil (VIAGRA) 25 MG tablet 1-2 tablets as needed 1 hr before sexual activity     No current facility-administered medications for this visit.    Allergies as of 11/16/2023 - Review Complete 11/16/2023  Allergen Reaction Noted   Other Other (See Comments) 08/17/2011   Penicillins Rash 04/20/2011    Family History  Problem Relation Age of Onset   Diabetes Mother    Hyperlipidemia Mother    Hypertension Mother  Emphysema Father    Heart failure Father    Heart attack Brother    Aneurysm Sister    Deep vein thrombosis Sister    Diabetes Sister    Heart attack Sister     Social History   Socioeconomic History   Marital status: Married    Spouse name: Not on file   Number of children: Not on file   Years of education: Not on file   Highest education level: Not on file  Occupational History   Not on file  Tobacco Use   Smoking status: Former    Current packs/day: 0.00    Average packs/day: (0.2 ttl pk-yrs)    Types: Cigars, Cigarettes    Start date: 01/04/1996    Quit date: 08/16/2011    Years since  quitting: 12.3   Smokeless tobacco: Never  Vaping Use   Vaping status: Never Used  Substance and Sexual Activity   Alcohol use: Yes    Alcohol/week: 3.0 standard drinks of alcohol    Types: 3 Cans of beer per week   Drug use: No   Sexual activity: Yes    Birth control/protection: Condom  Other Topics Concern   Not on file  Social History Narrative   Not on file   Social Drivers of Health   Financial Resource Strain: Not on file  Food Insecurity: Not on file  Transportation Needs: Not on file  Physical Activity: Not on file  Stress: Not on file  Social Connections: Not on file  Intimate Partner Violence: Not on file    Subjective: Review of Systems  Constitutional:  Negative for chills and fever.  HENT:  Negative for congestion and hearing loss.   Eyes:  Negative for blurred vision and double vision.  Respiratory:  Negative for cough and shortness of breath.   Cardiovascular:  Negative for chest pain and palpitations.  Gastrointestinal:  Positive for constipation. Negative for abdominal pain, blood in stool, diarrhea, heartburn, melena and vomiting.  Genitourinary:  Negative for dysuria and urgency.  Musculoskeletal:  Negative for joint pain and myalgias.  Skin:  Negative for itching and rash.  Neurological:  Negative for dizziness and headaches.  Psychiatric/Behavioral:  Negative for depression. The patient is not nervous/anxious.        Objective: BP 129/72   Pulse (!) 106   Temp 97.9 F (36.6 C)   Ht 6\' 1"  (1.854 m)   Wt 258 lb 12.8 oz (117.4 kg)   BMI 34.14 kg/m  Physical Exam Constitutional:      Appearance: Normal appearance.  HENT:     Head: Normocephalic and atraumatic.  Eyes:     Extraocular Movements: Extraocular movements intact.     Conjunctiva/sclera: Conjunctivae normal.  Cardiovascular:     Rate and Rhythm: Normal rate and regular rhythm.  Pulmonary:     Effort: Pulmonary effort is normal.     Breath sounds: Normal breath sounds.   Abdominal:     General: Bowel sounds are normal.     Palpations: Abdomen is soft.  Musculoskeletal:        General: Normal range of motion.     Cervical back: Normal range of motion and neck supple.  Skin:    General: Skin is warm.  Neurological:     General: No focal deficit present.     Mental Status: He is alert and oriented to person, place, and time.  Psychiatric:        Mood and Affect: Mood normal.  Behavior: Behavior normal.      Assessment/Plan:  1.  Colon cancer screening- Will schedule for screening colonoscopy.The risks including infection, bleed, or perforation as well as benefits, limitations, alternatives and imponderables have been reviewed with the patient. Questions have been answered. All parties agreeable.  Patient will need to hold his apixaban x 2 days prior to procedure.  Discussed slightly increased risk of cardiovascular event during this time and he understands and is agreeable.  2.  Chronic constipation-mild,  I recommended taking MiraLAX 1 capful daily.  If this is not adequate then increase to 2 capfuls daily.  If this is still not adequate then add on once daily Dulcolax.  3.  Chronic anticoagulation-see above.  Also recommended patient start taking fiber therapy.  Print out given to patient today.  Encouraged to drink at least 6 glasses of water daily.  Thank you Dr. Durwin Nora for the kind referral.   12/03/2023 3:24 PM   Disclaimer: This note was dictated with voice recognition software. Similar sounding words can inadvertently be transcribed and may not be corrected upon review.

## 2023-12-05 ENCOUNTER — Telehealth: Payer: Self-pay | Admitting: Internal Medicine

## 2023-12-05 NOTE — Telephone Encounter (Signed)
 Patient is calling asking what and why he is having the pre admitting test AP-DOIBP this procedure done? Patient call back # [phone].

## 2023-12-06 ENCOUNTER — Ambulatory Visit

## 2023-12-06 VITALS — BP 129/72 | Ht 73.0 in | Wt 253.0 lb

## 2023-12-06 DIAGNOSIS — E119 Type 2 diabetes mellitus without complications: Secondary | ICD-10-CM

## 2023-12-06 DIAGNOSIS — Z5320 Procedure and treatment not carried out because of patient's decision for unspecified reasons: Secondary | ICD-10-CM

## 2023-12-06 DIAGNOSIS — Z2821 Immunization not carried out because of patient refusal: Secondary | ICD-10-CM

## 2023-12-06 DIAGNOSIS — Z0000 Encounter for general adult medical examination without abnormal findings: Secondary | ICD-10-CM

## 2023-12-06 NOTE — Progress Notes (Signed)
 Because this visit was a virtual/telehealth visit,  certain criteria was not obtained, such a blood pressure, CBG if applicable, and timed get up and go. Any medications not marked as "taking" were not mentioned during the medication reconciliation part of the visit. Any vitals not documented were not able to be obtained due to this being a telehealth visit or patient was unable to self-report a recent blood pressure reading due to a lack of equipment at home via telehealth. Vitals that have been documented are verbally provided by the patient.   Subjective:   [name] is a 68 y.o. who presents for a Medicare Wellness preventive visit.  Visit Complete: Virtual I connected with  Caprice Beaver on 12/06/23 by a audio enabled telemedicine application and verified that I am speaking with the correct person using two identifiers.  Patient Location: Home  Provider Location: Home Office  I discussed the limitations of evaluation and management by telemedicine. The patient expressed understanding and agreed to proceed.  Vital Signs: Because this visit was a virtual/telehealth visit, some criteria may be missing or patient reported. Any vitals not documented were not able to be obtained and vitals that have been documented are patient reported.  VideoDeclined- This patient declined Librarian, academic. Therefore the visit was completed with audio only.  Persons Participating in Visit: Patient.  AWV Questionnaire: No: Patient Medicare AWV questionnaire was not completed prior to this visit.  Cardiac Risk Factors include: advanced age (>8men, >52 women);diabetes mellitus;male gender;hypertension;obesity (BMI >30kg/m2);dyslipidemia;Other (see comment), Risk factor comments: A FIb     Objective:    Today's Vitals   12/06/23 1132  BP: 129/72  Weight: 253 lb (114.8 kg)  Height: 6\' 1"  (1.854 m)   Body mass index is 33.38 kg/m.     12/06/2023   11:46 AM  01/28/2016   11:18 AM 06/03/2015    9:36 PM 08/18/2011    4:29 PM 08/17/2011    5:08 PM  Advanced Directives  Does Patient Have a Medical Advance Directive? No No No Patient does not have advance directive;Patient would not like information Patient does not have advance directive;Patient would not like information  Would patient like information on creating a medical advance directive? No - Patient declined No - patient declined information     Pre-existing out of facility DNR order (yellow form or pink MOST form)     No    Current Medications (verified) Outpatient Encounter Medications as of 12/06/2023  Medication Sig   apixaban (ELIQUIS) 5 MG TABS tablet Take 1 tablet (5 mg total) by mouth 2 (two) times daily.   atorvastatin (LIPITOR) 40 MG tablet Take 1 tablet by mouth once daily   Cholecalciferol (VITAMIN D3) 125 MCG (5000 UT) CAPS Take 1 capsule by mouth daily.   furosemide (LASIX) 20 MG tablet TAKE 1 TABLET BY MOUTH ONCE DAILY. MAY TAKE AN ADDITIONAL 20 MG DAILY AS NEEDED FOR SWELLING - DOSE CHANGE   lisinopril (ZESTRIL) 2.5 MG tablet Take 1 tablet by mouth once daily   metoprolol succinate (TOPROL-XL) 100 MG 24 hr tablet TAKE 1 TABLET BY MOUTH TWICE DAILY. TAKE WITH OR IMMEDIATELY FOLLOWING A  MEAL.   naproxen (NAPROSYN) 500 MG tablet Take 1 tablet (500 mg total) by mouth 2 (two) times daily with a meal.   Omega-3 Fatty Acids (FISH OIL) 1000 MG CAPS Take 1 capsule by mouth daily.   sildenafil (VIAGRA) 25 MG tablet 1-2 tablets as needed 1 hr before sexual activity  No facility-administered encounter medications on file as of 12/06/2023.    Allergies (verified) Other and Penicillins   History: Past Medical History:  Diagnosis Date   Angina    Atrial fibrillation (HCC)    Atrial flutter (HCC) 2007   h/o   Blood dyscrasia    Coronary artery disease 08/19/11   Diabetes mellitus    diet controlled   Gilbert's syndrome    Hyperlipidemia    Hypertension    Peripheral vascular  disease (HCC)    Stroke (HCC) ~08/17/11   "lost vision left eye"   Vertigo    Past Surgical History:  Procedure Laterality Date   ACHILLES TENDON REPAIR  06/2002   Left   CARDIOVERSION  2007   CAROTID ANGIOGRAM  08/19/2011   Procedure: CAROTID ANGIOGRAM;  Surgeon: Chuck Hint, MD;  Location: [facility] CATH LAB;  Service: Cardiovascular;;   CEREBRAL ANGIOGRAM N/A 08/19/2011   Procedure: CEREBRAL ANGIOGRAM;  Surgeon: Chuck Hint, MD;  Location: [facility] CATH LAB;  Service: Cardiovascular;  Laterality: N/A;   Family History  Problem Relation Age of Onset   Diabetes Mother    Hyperlipidemia Mother    Hypertension Mother    Emphysema Father    Heart failure Father    Heart attack Brother    Aneurysm Sister    Deep vein thrombosis Sister    Diabetes Sister    Heart attack Sister    Social History   Socioeconomic History   Marital status: Married    Spouse name: Not on file   Number of children: Not on file   Years of education: Not on file   Highest education level: Not on file  Occupational History   Not on file  Tobacco Use   Smoking status: Former    Current packs/day: 0.00    Average packs/day: (0.2 ttl pk-yrs)    Types: Cigars, Cigarettes    Start date: 01/04/1996    Quit date: 08/16/2011    Years since quitting: 12.3   Smokeless tobacco: Never  Vaping Use   Vaping status: Never Used  Substance and Sexual Activity   Alcohol use: Yes    Alcohol/week: 3.0 standard drinks of alcohol    Types: 3 Cans of beer per week   Drug use: No   Sexual activity: Yes    Birth control/protection: Condom  Other Topics Concern   Not on file  Social History Narrative   Not on file   Social Drivers of Health   Financial Resource Strain: Patient Declined (12/06/2023)   Overall Financial Resource Strain (CARDIA)    Difficulty of Paying Living Expenses: Patient declined  Food Insecurity: No Food Insecurity (12/06/2023)   Hunger Vital Sign    Worried About Running Out of  Food in the Last Year: Never true    Ran Out of Food in the Last Year: Never true  Transportation Needs: No Transportation Needs (12/06/2023)   PRAPARE - Administrator, Civil Service (Medical): No    Lack of Transportation (Non-Medical): No  Physical Activity: Sufficiently Active (12/06/2023)   Exercise Vital Sign    Days of Exercise per Week: 7 days    Minutes of Exercise per Session: 30 min  Stress: No Stress Concern Present (12/06/2023)   [employer] of Occupational Health - Occupational Stress Questionnaire    Feeling of Stress : Not at all  Social Connections: Socially Integrated (12/06/2023)   Social Connection and Isolation Panel [NHANES]    Frequency of Communication with  Friends and Family: More than three times a week    Frequency of Social Gatherings with Friends and Family: More than three times a week    Attends Religious Services: More than 4 times per year    Active Member of [employer] or Organizations: Yes    Attends Engineer, structural: More than 4 times per year    Marital Status: Married    Tobacco Counseling Counseling given: Not Answered    Clinical Intake:  Pre-visit preparation completed: Yes  Pain : No/denies pain     BMI - recorded: 33.38 Nutritional Status: BMI > 30  Obese Nutritional Risks: None Diabetes: No  Lab Results  Component Value Date   HGBA1C 5.7 (H) 10/03/2023   HGBA1C 5.8 (H) 08/18/2011   HGBA1C  11/01/2008    5.9 (NOTE)   The ADA recommends the following therapeutic goal for glycemic   control related to Hgb A1C measurement:   Goal of Therapy:   < 7.0% Hgb A1C   Reference: American Diabetes Association: Clinical Practice   Recommendations 2008, Diabetes Care,  2008, 31:(Suppl 1).     How often do you need to have someone help you when you read instructions, pamphlets, or other written materials from your doctor or pharmacy?: 1 - Never  Interpreter Needed?: No  Information entered by :: Estell Harpin   Activities of Daily Living     12/06/2023   11:43 AM  In your present state of health, do you have any difficulty performing the following activities:  Hearing? 0  Vision? 1  Comment blind in one eye  Difficulty concentrating or making decisions? 0  Walking or climbing stairs? 0  Dressing or bathing? 0  Doing errands, shopping? 0  Preparing Food and eating ? N  Using the Toilet? N  Do you have problems with loss of bowel control? N  Managing your Medications? N  Managing your Finances? N  Housekeeping or managing your Housekeeping? N    Patient Care Team: Billie Lade, MD as PCP - General (Internal Medicine) Wyline Mood Dorothe Pea, MD as PCP - Cardiology (Cardiology) Jena Gauss Gerrit Friends, MD as Consulting Physician (Gastroenterology)  Indicate any recent Medical Services you may have received from other than [facility] providers in the past year (date may be approximate).     Assessment:   This is a routine wellness examination for Dayne.  Hearing/Vision screen Hearing Screening - Comments:: No hearing issues  Vision Screening - Comments:: Blind in left eye due to stroke    Goals Addressed               This Visit's Progress     Patient Stated (pt-stated)        Patient states he will like a healthier diet        Depression Screen     12/06/2023   11:48 AM 10/03/2023    9:31 AM 05/28/2013    3:52 PM  PHQ 2/9 Scores  PHQ - 2 Score 0 0 0  PHQ- 9 Score 0 0     Fall Risk     12/06/2023   11:46 AM 10/03/2023    8:33 AM 05/28/2013    3:52 PM  Fall Risk   Falls in the past year? 0 0 No  Number falls in past yr: 0 0   Injury with Fall? 0 0   Risk for fall due to : History of fall(s);Impaired balance/gait No Fall Risks   Follow up Falls evaluation completed  Falls evaluation completed     MEDICARE RISK AT HOME:  Medicare Risk at Home Any stairs in or around the home?: No If so, are there any without handrails?: No Home free of loose throw rugs in  walkways, pet beds, electrical cords, etc?: Yes Adequate lighting in your home to reduce risk of falls?: Yes Life alert?: No Use of a cane, walker or w/c?: Yes (cane) Grab bars in the bathroom?: No Shower chair or bench in shower?: No Elevated toilet seat or a handicapped toilet?: No  TIMED UP AND GO:  Was the test performed?  No  Cognitive Function: 6CIT completed        12/06/2023   11:35 AM  6CIT Screen  What Year? 0 points  What month? 0 points  What time? 0 points  Count back from 20 0 points  Months in reverse 0 points  Repeat phrase 0 points  Total Score 0 points    Immunizations Immunization History  Administered Date(s) Administered   Fluad Trivalent(High Dose 65+) 10/03/2023   Influenza,inj,Quad PF,6+ Mos 06/29/2016, 06/09/2017   Moderna Sars-Cov-2 Peds vaccine 42yrs thru 24yrs 04/13/2021   Moderna Sars-Covid-2 Vaccination 08/11/2020   Tdap 06/03/2015    Screening Tests Health Maintenance  Topic Date Due   Pneumonia Vaccine 15+ Years old (1 of 2 - PCV) Never done   FOOT EXAM  Never done   Diabetic kidney evaluation - Urine ACR  Never done   Zoster Vaccines- Shingrix (1 of 2) Never done   Colonoscopy  Never done   OPHTHALMOLOGY EXAM  08/16/2012   COVID-19 Vaccine (5 - 2024-25 season) 05/07/2023   HEMOGLOBIN A1C  04/01/2024   INFLUENZA VACCINE  04/05/2024   Diabetic kidney evaluation - eGFR measurement  10/02/2024   Medicare Annual Wellness (AWV)  12/05/2024   DTaP/Tdap/Td (2 - Td or Tdap) 06/02/2025   Hepatitis C Screening  Completed   HPV VACCINES  Aged Out    Health Maintenance  Health Maintenance Due  Topic Date Due   Pneumonia Vaccine 55+ Years old (1 of 2 - PCV) Never done   FOOT EXAM  Never done   Diabetic kidney evaluation - Urine ACR  Never done   Zoster Vaccines- Shingrix (1 of 2) Never done   Colonoscopy  Never done   OPHTHALMOLOGY EXAM  08/16/2012   COVID-19 Vaccine (5 - 2024-25 season) 05/07/2023   Health Maintenance Items  Addressed:patient declined vaccines. Patient is scheduled colonoscopy  Additional Screening:  Vision Screening: Recommended annual ophthalmology exams for early detection of glaucoma and other disorders of the eye.  Dental Screening: Recommended annual dental exams for proper oral hygiene  Community Resource Referral / Chronic Care Management: CRR required this visit?  No   CCM required this visit?  No     Plan:     I have personally reviewed and noted the following in the patient's chart:   Medical and social history Use of alcohol, tobacco or illicit drugs  Current medications and supplements including opioid prescriptions. Patient is not currently taking opioid prescriptions. Functional ability and status Nutritional status Physical activity Advanced directives List of other physicians Hospitalizations, surgeries, and ER visits in previous 12 months Vitals Screenings to include cognitive, depression, and falls Referrals and appointments  In addition, I have reviewed and discussed with patient certain preventive protocols, quality metrics, and best practice recommendations. A written personalized care plan for preventive services as well as general preventive health recommendations were provided to patient.  Rudi Heap, [place]   12/06/2023   After Visit Summary: (MyChart) Due to this being a telephonic visit, the after visit summary with patients personalized plan was offered to patient via MyChart   Notes: Nothing significant to report at this time.

## 2023-12-06 NOTE — Patient Instructions (Signed)
 [name] , Thank you for taking time to come for your Medicare Wellness Visit. I appreciate your ongoing commitment to your health goals. Please review the following plan we discussed and let me know if I can assist you in the future.   Referrals/Orders/Follow-Ups/Clinician Recommendations: Follow Up   This is a list of the screening recommended for you and due dates:  Health Maintenance  Topic Date Due   Pneumonia Vaccine (1 of 2 - PCV) Never done   Complete foot exam   Never done   Yearly kidney health urinalysis for diabetes  Never done   Zoster (Shingles) Vaccine (1 of 2) Never done   Colon Cancer Screening  Never done   Eye exam for diabetics  08/16/2012   COVID-19 Vaccine (5 - 2024-25 season) 05/07/2023   Hemoglobin A1C  04/01/2024   Flu Shot  04/05/2024   Yearly kidney function blood test for diabetes  10/02/2024   Medicare Annual Wellness Visit  12/05/2024   DTaP/Tdap/Td vaccine (2 - Td or Tdap) 06/02/2025   Hepatitis C Screening  Completed   HPV Vaccine  Aged Out    Advanced directives: (Declined) Advance directive discussed with you today. Even though you declined this today, please call our office should you change your mind, and we can give you the proper paperwork for you to fill out.  Next Medicare Annual Wellness Visit scheduled for next year: Yes

## 2023-12-27 ENCOUNTER — Telehealth: Payer: Self-pay | Admitting: *Deleted

## 2023-12-27 NOTE — Patient Instructions (Signed)
 [name]  12/27/2023     @PREFPERIOPPHARMACY @   Your procedure is scheduled on  01/01/2024.   Report to [facility] at  0600  A.M.   Call this number if you have problems the morning of surgery:  [phone]  If you experience any cold or flu symptoms such as cough, fever, chills, shortness of breath, etc. between now and your scheduled surgery, please notify us  at the above number.   Remember:        Your last dose of eliquis  should be on 12/29/2023.         DO NOT take any medications for diabetes the morning of your procedure.    Follow the diet and prep instructions given to you by the office.    You may drink clear liquids until  0330 am on 01/01/2024.    Clear liquids allowed are:                    Water, Juice (No red color; non-citric and without pulp; diabetics please choose diet or no sugar options), Carbonated beverages (diabetics please choose diet or no sugar options), Clear Tea (No creamer, milk, or cream, including half & half and powdered creamer), Black Coffee Only (No creamer, milk or cream, including half & half and powdered creamer), and Clear Sports drink (No red color; diabetics please choose diet or no sugar options)    Take these medicines the morning of surgery with A SIP OF WATER                                               metoprolol .    Do not wear jewelry, make-up or nail polish, including gel polish,  artificial nails, or any other type of covering on natural nails (fingers and  toes).  Do not wear lotions, powders, or perfumes, or deodorant.  Do not shave 48 hours prior to surgery.  Men may shave face and neck.  Do not bring valuables to the hospital.  [facility] is not responsible for any belongings or valuables.  Contacts, dentures or bridgework may not be worn into surgery.  Leave your suitcase in the car.  After surgery it may be brought to your room.  For patients admitted to the hospital, discharge time will be  determined by your treatment team.  Patients discharged the day of surgery will not be allowed to drive home and must have someone with them for 24 hours.    Special instructions:   DO NOT smoke tobacco or vape for 24 hours before your procedure.  Please read over the following fact sheets that you were given. Anesthesia Post-op Instructions and Care and Recovery After Surgery      Colonoscopy, Adult, Care After The following information offers guidance on how to care for yourself after your procedure. Your health care provider may also give you more specific instructions. If you have problems or questions, contact your health care provider. What can I expect after the procedure? After the procedure, it is common to have: A small amount of blood in your stool for 24 hours after the procedure. Some gas. Mild cramping or bloating of your abdomen. Follow these instructions at home: Eating and drinking  Drink enough fluid to keep your urine pale yellow. Follow instructions from your health care provider  about eating or drinking restrictions. Resume your normal diet as told by your health care provider. Avoid heavy or fried foods that are hard to digest. Activity Rest as told by your health care provider. Avoid sitting for a long time without moving. Get up to take short walks every 1-2 hours. This is important to improve blood flow and breathing. Ask for help if you feel weak or unsteady. Return to your normal activities as told by your health care provider. Ask your health care provider what activities are safe for you. Managing cramping and bloating  Try walking around when you have cramps or feel bloated. If directed, apply heat to your abdomen as told by your health care provider. Use the heat source that your health care provider recommends, such as a moist heat pack or a heating pad. Place a towel between your skin and the heat source. Leave the heat on for 20-30 minutes. Remove  the heat if your skin turns bright red. This is especially important if you are unable to feel pain, heat, or cold. You have a greater risk of getting burned. General instructions If you were given a sedative during the procedure, it can affect you for several hours. Do not drive or operate machinery until your health care provider says that it is safe. For the first 24 hours after the procedure: Do not sign important documents. Do not drink alcohol. Do your regular daily activities at a slower pace than normal. Eat soft foods that are easy to digest. Take over-the-counter and prescription medicines only as told by your health care provider. Keep all follow-up visits. This is important. Contact a health care provider if: You have blood in your stool 2-3 days after the procedure. Get help right away if: You have more than a small spotting of blood in your stool. You have large blood clots in your stool. You have swelling of your abdomen. You have nausea or vomiting. You have a fever. You have increasing pain in your abdomen that is not relieved with medicine. These symptoms may be an emergency. Get help right away. Call 911. Do not wait to see if the symptoms will go away. Do not drive yourself to the hospital. Summary After the procedure, it is common to have a small amount of blood in your stool. You may also have mild cramping and bloating of your abdomen. If you were given a sedative during the procedure, it can affect you for several hours. Do not drive or operate machinery until your health care provider says that it is safe. Get help right away if you have a lot of blood in your stool, nausea or vomiting, a fever, or increased pain in your abdomen. This information is not intended to replace advice given to you by your health care provider. Make sure you discuss any questions you have with your health care provider. Document Revised: 10/04/2022 Document Reviewed: 04/14/2021 Elsevier  Patient Education  2024 Elsevier Inc.General Anesthesia, Adult, Care After The following information offers guidance on how to care for yourself after your procedure. Your health care provider may also give you more specific instructions. If you have problems or questions, contact your health care provider. What can I expect after the procedure? After the procedure, it is common for people to: Have pain or discomfort at the IV site. Have nausea or vomiting. Have a sore throat or hoarseness. Have trouble concentrating. Feel cold or chills. Feel weak, sleepy, or tired (fatigue). Have soreness and body  aches. These can affect parts of the body that were not involved in surgery. Follow these instructions at home: For the time period you were told by your health care provider:  Rest. Do not participate in activities where you could fall or become injured. Do not drive or use machinery. Do not drink alcohol. Do not take sleeping pills or medicines that cause drowsiness. Do not make important decisions or sign legal documents. Do not take care of children on your own. General instructions Drink enough fluid to keep your urine pale yellow. If you have sleep apnea, surgery and certain medicines can increase your risk for breathing problems. Follow instructions from your health care provider about wearing your sleep device: Anytime you are sleeping, including during daytime naps. While taking prescription pain medicines, sleeping medicines, or medicines that make you drowsy. Return to your normal activities as told by your health care provider. Ask your health care provider what activities are safe for you. Take over-the-counter and prescription medicines only as told by your health care provider. Do not use any products that contain nicotine or tobacco. These products include cigarettes, chewing tobacco, and vaping devices, such as e-cigarettes. These can delay incision healing after surgery. If  you need help quitting, ask your health care provider. Contact a health care provider if: You have nausea or vomiting that does not get better with medicine. You vomit every time you eat or drink. You have pain that does not get better with medicine. You cannot urinate or have bloody urine. You develop a skin rash. You have a fever. Get help right away if: You have trouble breathing. You have chest pain. You vomit blood. These symptoms may be an emergency. Get help right away. Call 911. Do not wait to see if the symptoms will go away. Do not drive yourself to the hospital. Summary After the procedure, it is common to have a sore throat, hoarseness, nausea, vomiting, or to feel weak, sleepy, or fatigue. For the time period you were told by your health care provider, do not drive or use machinery. Get help right away if you have difficulty breathing, have chest pain, or vomit blood. These symptoms may be an emergency. This information is not intended to replace advice given to you by your health care provider. Make sure you discuss any questions you have with your health care provider. Document Revised: 11/19/2021 Document Reviewed: 11/19/2021 Elsevier Patient Education  2024 [employer].

## 2023-12-27 NOTE — Telephone Encounter (Signed)
 RE: eliquis  Received: Today Venancio Gibney, RN  Alvester Johnson, LPN Thank you!       Previous Messages    ----- Message ----- From: Alvester Johnson, LPN Sent: 1/61/0960  12:25 PM EDT To: Venancio Gibney, RN Subject: RE: eliquis                                     I will send the clearance ----- Message ----- From: Venancio Gibney, RN Sent: 12/27/2023  12:11 PM EDT To: Alvester Johnson, LPN Subject: RE: eliquis                                     I saw that as well. We probably need to make sure cardiology is okay with holding x2 days, since the last clearance was only allowed for 1 day. ----- Message ----- From: Alvester Johnson, LPN Sent: 4/54/0981  11:53 AM EDT To: Venancio Gibney, RN Subject: RE: eliquis                                     I don't see a clearance. I see Dr.Carver put in the notes at the OV for pt to hold Eliquis  x 2 days ----- Message ----- From: Venancio Gibney, RN Sent: 12/27/2023  11:44 AM EDT To: Alvester Johnson, LPN; Rozann Cornell, LPN Subject: eliquis                                         Good morning! I am losing my mind! I cannot find a current cardiac clearance for Payten to hold eliquis  X2 days. The last clearance I see was pre tooth extraction 12/21/2022 which states he can only holfdeliquis for 1 day due to risk of stroke. Did I miss another clearance?   :O

## 2023-12-27 NOTE — Telephone Encounter (Signed)
  Request for patient to stop medication prior to procedure or is needing cleareance  12/27/23  [name] 03/18/1956  What type of surgery is being performed? Colonoscopy  When is surgery scheduled? 01/01/24  What type of clearance is required (medical or pharmacy to hold medication or both? Medication   Are there any medications that need to be held prior to surgery and how long? Eliquis  x 2 days  Name of physician performing surgery?  Dr.Carver [facility] [employer]: [phone] Fax: [phone]  Anethesia type (none, local, MAC, general)? MAC

## 2023-12-28 ENCOUNTER — Other Ambulatory Visit: Payer: Self-pay

## 2023-12-28 ENCOUNTER — Encounter (HOSPITAL_COMMUNITY): Payer: Self-pay

## 2023-12-28 ENCOUNTER — Encounter (HOSPITAL_COMMUNITY)
Admission: RE | Admit: 2023-12-28 | Discharge: 2023-12-28 | Disposition: A | Discharge status: Home / Self Care | Source: Ambulatory Visit | Attending: Internal Medicine | Admitting: Internal Medicine

## 2023-12-28 VITALS — BP 120/85 | HR 78 | Temp 97.8°F | Resp 18 | Ht 73.0 in | Wt 253.1 lb

## 2023-12-28 DIAGNOSIS — R7303 Prediabetes: Secondary | ICD-10-CM

## 2023-12-28 DIAGNOSIS — Z01812 Encounter for preprocedural laboratory examination: Secondary | ICD-10-CM | POA: Diagnosis not present

## 2023-12-28 DIAGNOSIS — E119 Type 2 diabetes mellitus without complications: Secondary | ICD-10-CM | POA: Diagnosis not present

## 2023-12-28 LAB — BASIC METABOLIC PANEL WITH GFR
Anion gap: 8 (ref 5–15)
BUN: 23 mg/dL (ref 8–23)
CO2: 26 mmol/L (ref 22–32)
Calcium: 10.4 mg/dL — ABNORMAL HIGH (ref 8.9–10.3)
Chloride: 106 mmol/L (ref 98–111)
Creatinine, Ser: 1.19 mg/dL (ref 0.61–1.24)
GFR, Estimated: >60 mL/min (ref >60)
Glucose, Bld: 104 mg/dL — ABNORMAL HIGH (ref 70–99)
Potassium: 4.5 mmol/L (ref 3.5–5.1)
Sodium: 140 mmol/L (ref 135–145)

## 2023-12-28 NOTE — Telephone Encounter (Signed)
 Patient with diagnosis of afib on Eliquis  for anticoagulation.    Procedure: colonoscopy Date of procedure: 01/01/24   CHA2DS2-VASc Score = 6   This indicates a 9.7% annual risk of stroke. The patient's score is based upon: CHF History: 1 HTN History: 1 Diabetes History: 0 Stroke History: 2 Vascular Disease History: 1 Age Score: 1 Gender Score: 0      CrCl 84 ml/min Platelet count 190  Patient has not had an Afib/aflutter ablation within the last 3 months or DCCV within the last 30 days  Per office protocol, patient can hold Eliquis  for 2 days prior to procedure.    Please resume as soon as safely possible post opt  **This guidance is not considered finalized until pre-operative APP has relayed final recommendations.**

## 2023-12-28 NOTE — Telephone Encounter (Signed)
     Primary Cardiologist: Armida Lander, MD  Clinical pharmacist have reviewed patient's chart  as part of pre-operative protocol coverage.  The following recommendations have been provided for , [name]   Patient with diagnosis of afib on Eliquis  for anticoagulation.     Procedure: colonoscopy Date of procedure: 01/01/24     CHA2DS2-VASc Score = 6   This indicates a 9.7% annual risk of stroke. The patient's score is based upon: CHF History: 1 HTN History: 1 Diabetes History: 0 Stroke History: 2 Vascular Disease History: 1 Age Score: 1 Gender Score: 0       CrCl 84 ml/min Platelet count 190   Patient has not had an Afib/aflutter ablation within the last 3 months or DCCV within the last 30 days   Per office protocol, patient can hold Eliquis  for 2 days prior to procedure.     Please resume as soon as safely possible post opt  I will route this recommendation to the requesting party via Epic fax function and remove from pre-op pool.  Please call with questions.  Chet Cota. Rithvik Orcutt NP-C     12/28/2023, 8:04 AM [facility] [address] Office [phone] Fax [phone]

## 2024-01-01 ENCOUNTER — Ambulatory Visit (HOSPITAL_COMMUNITY): Admitting: Anesthesiology

## 2024-01-01 ENCOUNTER — Encounter (HOSPITAL_COMMUNITY)
Admission: RE | Disposition: A | Discharge status: Home / Self Care | Payer: Self-pay | Source: Home / Self Care | Attending: Internal Medicine

## 2024-01-01 ENCOUNTER — Encounter (HOSPITAL_COMMUNITY): Payer: Self-pay | Admitting: Internal Medicine

## 2024-01-01 ENCOUNTER — Ambulatory Visit (HOSPITAL_COMMUNITY)
Admission: RE | Admit: 2024-01-01 | Discharge: 2024-01-01 | Disposition: A | Discharge status: Home / Self Care | Attending: Internal Medicine | Admitting: Internal Medicine

## 2024-01-01 DIAGNOSIS — I110 Hypertensive heart disease with heart failure: Secondary | ICD-10-CM | POA: Diagnosis not present

## 2024-01-01 DIAGNOSIS — I25119 Atherosclerotic heart disease of native coronary artery with unspecified angina pectoris: Secondary | ICD-10-CM | POA: Diagnosis not present

## 2024-01-01 DIAGNOSIS — Z1211 Encounter for screening for malignant neoplasm of colon: Secondary | ICD-10-CM

## 2024-01-01 DIAGNOSIS — K635 Polyp of colon: Secondary | ICD-10-CM | POA: Diagnosis not present

## 2024-01-01 DIAGNOSIS — D122 Benign neoplasm of ascending colon: Secondary | ICD-10-CM

## 2024-01-01 DIAGNOSIS — I4891 Unspecified atrial fibrillation: Secondary | ICD-10-CM | POA: Diagnosis not present

## 2024-01-01 DIAGNOSIS — E1151 Type 2 diabetes mellitus with diabetic peripheral angiopathy without gangrene: Secondary | ICD-10-CM | POA: Insufficient documentation

## 2024-01-01 DIAGNOSIS — F17290 Nicotine dependence, other tobacco product, uncomplicated: Secondary | ICD-10-CM | POA: Diagnosis not present

## 2024-01-01 DIAGNOSIS — K5730 Diverticulosis of large intestine without perforation or abscess without bleeding: Secondary | ICD-10-CM | POA: Insufficient documentation

## 2024-01-01 DIAGNOSIS — I509 Heart failure, unspecified: Secondary | ICD-10-CM | POA: Insufficient documentation

## 2024-01-01 DIAGNOSIS — D123 Benign neoplasm of transverse colon: Secondary | ICD-10-CM | POA: Diagnosis not present

## 2024-01-01 DIAGNOSIS — Z129 Encounter for screening for malignant neoplasm, site unspecified: Secondary | ICD-10-CM | POA: Diagnosis not present

## 2024-01-01 HISTORY — PX: COLONOSCOPY: SHX5424

## 2024-01-01 LAB — GLUCOSE, CAPILLARY: Glucose-Capillary: 80 mg/dL (ref 70–99)

## 2024-01-01 SURGERY — COLONOSCOPY
Anesthesia: General

## 2024-01-01 MED ORDER — PROPOFOL 500 MG/50ML IV EMUL
INTRAVENOUS | Status: DC | PRN
Start: 2024-01-01 — End: 2024-01-01
  Administered 2024-01-01: 200 ug/kg/min via INTRAVENOUS

## 2024-01-01 MED ORDER — SODIUM CHLORIDE 0.9% FLUSH: 3.0000 mL | Freq: Two times a day (BID) | INTRAVENOUS | Status: DC

## 2024-01-01 MED ORDER — LACTATED RINGERS IV SOLN: INTRAVENOUS | Status: DC | PRN

## 2024-01-01 MED ORDER — SODIUM CHLORIDE 0.9% FLUSH: 3.0000 mL | INTRAVENOUS | Status: DC | PRN

## 2024-01-01 MED ORDER — PROPOFOL 10 MG/ML IV BOLUS
INTRAVENOUS | Status: DC | PRN
  Administered 2024-01-01: 100 mg via INTRAVENOUS

## 2024-01-01 NOTE — H&P (Signed)
 Primary Care Physician:  Tobi Fortes, MD Primary Gastroenterologist:  Dr. Mordechai April  Pre-Procedure History & Physical: HPI:  [name] is a 68 y.o. male is here for a colonoscopy for colon cancer screening purposes.   Past Medical History:  Diagnosis Date   Angina    Atrial fibrillation (HCC)    Atrial flutter (HCC) 09/05/2005   h/o   Blood dyscrasia    Coronary artery disease 08/19/2011   Diabetes mellitus    diet controlled   Gilbert's syndrome    Hyperlipidemia    Hypertension    Peripheral vascular disease (HCC)    Stroke (HCC) ~08/17/11   "lost vision left eye"   Vertigo     Past Surgical History:  Procedure Laterality Date   ACHILLES TENDON REPAIR  06/2002   Left   CARDIOVERSION  2007   CAROTID ANGIOGRAM  08/19/2011   Procedure: CAROTID ANGIOGRAM;  Surgeon: Dannis Dy, MD;  Location: [facility] CATH LAB;  Service: Cardiovascular;;   CEREBRAL ANGIOGRAM N/A 08/19/2011   Procedure: CEREBRAL ANGIOGRAM;  Surgeon: Dannis Dy, MD;  Location: [facility] CATH LAB;  Service: Cardiovascular;  Laterality: N/A;    Prior to Admission medications   Medication Sig Start Date End Date Taking? Authorizing Provider  atorvastatin  (LIPITOR) 40 MG tablet Take 1 tablet by mouth once daily 07/25/23  Yes Branch, Joyceann No, MD  Cholecalciferol (VITAMIN D3) 125 MCG (5000 UT) CAPS Take 1 capsule by mouth daily.   Yes [provider]  furosemide  (LASIX ) 20 MG tablet TAKE 1 TABLET BY MOUTH ONCE DAILY. MAY TAKE AN ADDITIONAL 20 MG DAILY AS NEEDED FOR SWELLING - DOSE CHANGE 09/18/23  Yes Branch, Joyceann No, MD  lisinopril  (ZESTRIL ) 2.5 MG tablet Take 1 tablet by mouth once daily 08/16/23  Yes Branch, Jonathan F, MD  metoprolol  succinate (TOPROL -XL) 100 MG 24 hr tablet TAKE 1 TABLET BY MOUTH TWICE DAILY. TAKE WITH OR IMMEDIATELY FOLLOWING A  MEAL. 01/16/23  Yes BranchJoyceann No, MD  naproxen  (NAPROSYN ) 500 MG tablet Take 1 tablet (500 mg total) by mouth 2 (two) times daily  with a meal. 05/17/23  Yes Pleasant Brilliant, MD  Omega-3 Fatty Acids (FISH OIL) 1000 MG CAPS Take 1 capsule by mouth daily.   Yes [provider]  sildenafil (VIAGRA) 25 MG tablet 1-2 tablets as needed 1 hr before sexual activity   Yes [provider]  apixaban  (ELIQUIS ) 5 MG TABS tablet Take 1 tablet (5 mg total) by mouth 2 (two) times daily. 06/20/23   Laurann Pollock, MD    Allergies as of 11/17/2023 - Review Complete 11/16/2023  Allergen Reaction Noted   Other Other (See Comments) 08/17/2011   Penicillins Rash 04/20/2011    Family History  Problem Relation Age of Onset   Diabetes Mother    Hyperlipidemia Mother    Hypertension Mother    Emphysema Father    Heart failure Father    Heart attack Brother    Aneurysm Sister    Deep vein thrombosis Sister    Diabetes Sister    Heart attack Sister     Social History   Socioeconomic History   Marital status: Married    Spouse name: Not on file   Number of children: Not on file   Years of education: Not on file   Highest education level: Not on file  Occupational History   Not on file  Tobacco Use   Smoking status: Some Days    Types: Cigars  Smokeless tobacco: Never  Vaping Use   Vaping status: Never Used  Substance and Sexual Activity   Alcohol use: Yes    Alcohol/week: 4.0 standard drinks of alcohol    Types: 3 Cans of beer, 1 Shots of liquor per week   Drug use: No   Sexual activity: Yes    Birth control/protection: Condom  Other Topics Concern   Not on file  Social History Narrative   Not on file   Social Drivers of Health   Financial Resource Strain: Patient Declined (12/06/2023)   Overall Financial Resource Strain (CARDIA)    Difficulty of Paying Living Expenses: Patient declined  Food Insecurity: No Food Insecurity (12/06/2023)   Hunger Vital Sign    Worried About Running Out of Food in the Last Year: Never true    Ran Out of Food in the Last Year: Never true  Transportation Needs: No  Transportation Needs (12/06/2023)   PRAPARE - Administrator, Civil Service (Medical): No    Lack of Transportation (Non-Medical): No  Physical Activity: Sufficiently Active (12/06/2023)   Exercise Vital Sign    Days of Exercise per Week: 7 days    Minutes of Exercise per Session: 30 min  Stress: No Stress Concern Present (12/06/2023)   [employer] of Occupational Health - Occupational Stress Questionnaire    Feeling of Stress : Not at all  Social Connections: Socially Integrated (12/06/2023)   Social Connection and Isolation Panel [NHANES]    Frequency of Communication with Friends and Family: More than three times a week    Frequency of Social Gatherings with Friends and Family: More than three times a week    Attends Religious Services: More than 4 times per year    Active Member of Clubs or Organizations: Yes    Attends Banker Meetings: More than 4 times per year    Marital Status: Married  Catering manager Violence: Not At Risk (12/06/2023)   Humiliation, Afraid, Rape, and Kick questionnaire    Fear of Current or Ex-Partner: No    Emotionally Abused: No    Physically Abused: No    Sexually Abused: No    Review of Systems: See HPI, otherwise negative ROS  Physical Exam: Vital signs in last 24 hours: Temp:  [98.7 F (37.1 C)] 98.7 F (37.1 C) (04/28 0709) Resp:  [16] 16 (04/28 0709) BP: (153)/(86) 153/86 (04/28 0709) SpO2:  [100 %] 100 % (04/28 0709) Weight:  [114.8 kg] 114.8 kg (04/28 0708)   General:   Alert,  Well-developed, well-nourished, pleasant and cooperative in NAD Head:  Normocephalic and atraumatic. Eyes:  Sclera clear, no icterus.   Conjunctiva pink. Ears:  Normal auditory acuity. Nose:  No deformity, discharge,  or lesions. Msk:  Symmetrical without gross deformities. Normal posture. Extremities:  Without clubbing or edema. Neurologic:  Alert and  oriented x4;  grossly normal neurologically. Skin:  Intact without significant  lesions or rashes. Psych:  Alert and cooperative. Normal mood and affect.  Impression/Plan: [name] is here for a colonoscopy to be performed for colon cancer screening purposes.  The risks of the procedure including infection, bleed, or perforation as well as benefits, limitations, alternatives and imponderables have been reviewed with the patient. Questions have been answered. All parties agreeable.

## 2024-01-01 NOTE — Transfer of Care (Signed)
 Immediate Anesthesia Transfer of Care Note  Patient: [name]  Procedure(s) Performed: COLONOSCOPY  Patient Location: Endoscopy Unit  Anesthesia Type:General  Level of Consciousness: awake, alert , oriented, and patient cooperative  Airway & Oxygen Therapy: Patient Spontanous Breathing  Post-op Assessment: Report given to RN, Post -op Vital signs reviewed and stable, and Patient moving all extremities X 4  Post vital signs: Reviewed and stable  Last Vitals:  Vitals Value Taken Time  BP 102/72 01/01/24 0756  Temp 36.6 C 01/01/24 0752  Pulse 96 01/01/24 0752  Resp 20 01/01/24 0752  SpO2 100 % 01/01/24 0752    Last Pain:  Vitals:   01/01/24 0752  TempSrc: Axillary  PainSc:          Complications: No notable events documented.

## 2024-01-01 NOTE — Discharge Instructions (Signed)
  Colonoscopy Discharge Instructions  Read the instructions outlined below and refer to this sheet in the next few weeks. These discharge instructions provide you with general information on caring for yourself after you leave the hospital. Your doctor may also give you specific instructions. While your treatment has been planned according to the most current medical practices available, unavoidable complications occasionally occur.   ACTIVITY You may resume your regular activity, but move at a slower pace for the next 24 hours.  Take frequent rest periods for the next 24 hours.  Walking will help get rid of the air and reduce the bloated feeling in your belly (abdomen).  No driving for 24 hours (because of the medicine (anesthesia) used during the test).   Do not sign any important legal documents or operate any machinery for 24 hours (because of the anesthesia used during the test).  NUTRITION Drink plenty of fluids.  You may resume your normal diet as instructed by your doctor.  Begin with a light meal and progress to your normal diet. Heavy or fried foods are harder to digest and may make you feel sick to your stomach (nauseated).  Avoid alcoholic beverages for 24 hours or as instructed.  MEDICATIONS You may resume your normal medications unless your doctor tells you otherwise.  WHAT YOU CAN EXPECT TODAY Some feelings of bloating in the abdomen.  Passage of more gas than usual.  Spotting of blood in your stool or on the toilet paper.  IF YOU HAD POLYPS REMOVED DURING THE COLONOSCOPY: No aspirin  products for 7 days or as instructed.  No alcohol for 7 days or as instructed.  Eat a soft diet for the next 24 hours.  FINDING OUT THE RESULTS OF YOUR TEST Not all test results are available during your visit. If your test results are not back during the visit, make an appointment with your caregiver to find out the results. Do not assume everything is normal if you have not heard from your  caregiver or the medical facility. It is important for you to follow up on all of your test results.  SEEK IMMEDIATE MEDICAL ATTENTION IF: You have more than a spotting of blood in your stool.  Your belly is swollen (abdominal distention).  You are nauseated or vomiting.  You have a temperature over 101.  You have abdominal pain or discomfort that is severe or gets worse throughout the day.   Your colonoscopy revealed 2 polyp(s) which I removed successfully. Await pathology results, my office will contact you. I recommend repeating colonoscopy in 7 years for surveillance purposes.   Resume Eliquis  tomorrow.   Follow up with GI as needed.   I hope you have a great rest of your week and happy early birthday!  Rolando Cliche. Mordechai April, D.O. Gastroenterology and Hepatology [facility]

## 2024-01-01 NOTE — Anesthesia Postprocedure Evaluation (Signed)
 Anesthesia Post Note  Patient: [name]  Procedure(s) Performed: COLONOSCOPY  Patient location during evaluation: PACU Anesthesia Type: General Level of consciousness: awake and alert Pain management: pain level controlled Vital Signs Assessment: post-procedure vital signs reviewed and stable Respiratory status: spontaneous breathing, nonlabored ventilation, respiratory function stable and patient connected to nasal cannula oxygen Cardiovascular status: blood pressure returned to baseline and stable Postop Assessment: no apparent nausea or vomiting Anesthetic complications: no   There were no known notable events for this encounter.   Last Vitals:  Vitals:   01/01/24 0756 01/01/24 0757  BP: 102/72 124/66  Pulse:  (!) 102  Resp:  (!) 24  Temp:    SpO2:  100%    Last Pain:  Vitals:   01/01/24 0752  TempSrc: Axillary  PainSc:                  [name]

## 2024-01-01 NOTE — Anesthesia Preprocedure Evaluation (Addendum)
 Anesthesia Evaluation  Patient identified by MRN, date of birth, ID band Patient awake    Reviewed: Allergy & Precautions, H&P , NPO status , Patient's Chart, lab work & pertinent test results, reviewed documented beta blocker date and time   Airway Mallampati: II  TM Distance: >3 FB Neck ROM: full    Dental  (+) Edentulous Upper, Edentulous Lower   Pulmonary Current Smoker and Patient abstained from smoking.   Pulmonary exam normal breath sounds clear to auscultation       Cardiovascular Exercise Tolerance: Good hypertension, + angina  + CAD, + Peripheral Vascular Disease and +CHF  + dysrhythmias Atrial Fibrillation  Rhythm:regular Rate:Normal  EF 55%.  Abnormal nuclear stress test   Neuro/Psych CVA  negative psych ROS   GI/Hepatic negative GI ROS, Neg liver ROS,,,  Endo/Other  diabetes    Renal/GU negative Renal ROS  negative genitourinary   Musculoskeletal   Abdominal   Peds  Hematology negative hematology ROS (+)   Anesthesia Other Findings   Reproductive/Obstetrics negative OB ROS                             Anesthesia Physical Anesthesia Plan  ASA: 3  Anesthesia Plan: General   Post-op Pain Management: Minimal or no pain anticipated   Induction: Intravenous  PONV Risk Score and Plan: Propofol infusion  Airway Management Planned: Nasal Cannula and Natural Airway  Additional Equipment: None  Intra-op Plan:   Post-operative Plan:   Informed Consent: I have reviewed the patients History and Physical, chart, labs and discussed the procedure including the risks, benefits and alternatives for the proposed anesthesia with the patient or authorized representative who has indicated his/her understanding and acceptance.     Dental Advisory Given  Plan Discussed with: CRNA  Anesthesia Plan Comments:         Anesthesia Quick Evaluation

## 2024-01-01 NOTE — Op Note (Signed)
 [facility] Patient Name: [name] Procedure Date: 01/01/2024 7:11 AM MRN: [id number] Date of Birth: 09-16-1955 Attending MD: Rolando Cliche. Mordechai April , [place], [id number] CSN: [id number] Age: 68 Admit Type: Outpatient Procedure:                Colonoscopy Indications:              Screening for colorectal malignant neoplasm Providers:                Rolando Cliche. Mordechai April, DO, Vonna Guardian, [place] Wilson,                            Technician, Theola Fitch Referring MD:              Medicines:                See the Anesthesia note for documentation of the                            administered medications Complications:            No immediate complications. Estimated Blood Loss:     Estimated blood loss was minimal. Procedure:                Pre-Anesthesia Assessment:                           - The anesthesia plan was to use monitored                            anesthesia care (MAC).                           After obtaining informed consent, the colonoscope                            was passed under direct vision. Throughout the                            procedure, the patient's blood pressure, pulse, and                            oxygen saturations were monitored continuously. The                            [id number]-HQ190L ([id number]) scope was introduced through                            the anus and advanced to the the cecum, identified                            by appendiceal orifice and ileocecal valve. The                            colonoscopy was performed without difficulty. The                            patient tolerated the procedure  well. The quality                            of the bowel preparation was evaluated using the                            BBPS [facility] Bowel Preparation Scale) with scores                            of: Right Colon = 3, Transverse Colon = 3 and Left                            Colon = 3 (entire mucosa seen well with no residual                             staining, small fragments of stool or opaque                            liquid). The total BBPS score equals 9. Scope In: 7:33:57 AM Scope Out: 7:48:22 AM Scope Withdrawal Time: 0 hours 9 minutes 53 seconds  Total Procedure Duration: 0 hours 14 minutes 25 seconds  Findings:      Multiple large-mouthed and small-mouthed diverticula were found in the       sigmoid colon, proximal transverse colon and ascending colon.      An 8 mm polyp was found in the ascending colon. The polyp was sessile.       The polyp was removed with a cold snare. Resection and retrieval were       complete.      A 6 mm polyp was found in the transverse colon. The polyp was sessile.       The polyp was removed with a cold snare. Resection and retrieval were       complete.      The exam was otherwise without abnormality. Impression:               - Diverticulosis in the sigmoid colon, in the                            proximal transverse colon and in the ascending                            colon.                           - One 8 mm polyp in the ascending colon, removed                            with a cold snare. Resected and retrieved.                           - One 6 mm polyp in the transverse colon, removed                            with a cold snare. Resected and retrieved.                           -  The examination was otherwise normal. Moderate Sedation:      Per Anesthesia Care Recommendation:           - Patient has a contact number available for                            emergencies. The signs and symptoms of potential                            delayed complications were discussed with the                            patient. Return to normal activities tomorrow.                            Written discharge instructions were provided to the                            patient.                           - Resume previous diet.                           - Continue present medications.                            - Await pathology results.                           - Repeat colonoscopy in 7 years for surveillance.                           - Return to GI clinic PRN. Procedure Code(s):        --- Professional ---                           [phone], Colonoscopy, flexible; with removal of                            tumor(s), polyp(s), or other lesion(s) by snare                            technique Diagnosis Code(s):        --- Professional ---                           Z12.11, Encounter for screening for malignant                            neoplasm of colon                           D12.2, Benign neoplasm of ascending colon                           D12.3, Benign neoplasm of transverse colon (hepatic  flexure or splenic flexure)                           K57.30, Diverticulosis of large intestine without                            perforation or abscess without bleeding CPT copyright 2022 American Medical Association. All rights reserved. The codes documented in this report are preliminary and upon coder review may  be revised to meet current compliance requirements. Rolando Cliche. Mordechai April, DO Rolando Cliche. Mordechai April, DO 01/01/2024 7:52:04 AM This report has been signed electronically. Number of Addenda: 0

## 2024-01-02 ENCOUNTER — Ambulatory Visit (INDEPENDENT_AMBULATORY_CARE_PROVIDER_SITE_OTHER): Payer: Medicare HMO | Admitting: Internal Medicine

## 2024-01-02 ENCOUNTER — Encounter: Payer: Self-pay | Admitting: Internal Medicine

## 2024-01-02 VITALS — BP 138/72 | HR 94 | Ht 73.0 in | Wt 252.6 lb

## 2024-01-02 DIAGNOSIS — I10 Essential (primary) hypertension: Secondary | ICD-10-CM

## 2024-01-02 DIAGNOSIS — E782 Mixed hyperlipidemia: Secondary | ICD-10-CM

## 2024-01-02 DIAGNOSIS — E559 Vitamin D deficiency, unspecified: Secondary | ICD-10-CM | POA: Diagnosis not present

## 2024-01-02 DIAGNOSIS — I5022 Chronic systolic (congestive) heart failure: Secondary | ICD-10-CM | POA: Diagnosis not present

## 2024-01-02 DIAGNOSIS — I4821 Permanent atrial fibrillation: Secondary | ICD-10-CM

## 2024-01-02 DIAGNOSIS — R7303 Prediabetes: Secondary | ICD-10-CM | POA: Diagnosis not present

## 2024-01-02 LAB — SURGICAL PATHOLOGY

## 2024-01-02 MED ORDER — VITAMIN D (ERGOCALCIFEROL) 1.25 MG (50000 UNIT) PO CAPS: 50000.0000 [IU] | ORAL_CAPSULE | ORAL | 0 refills | Status: AC

## 2024-01-02 NOTE — Assessment & Plan Note (Signed)
 Remains adequately controlled on current antihypertensive regimen.  No medication changes are indicated today.

## 2024-01-02 NOTE — Assessment & Plan Note (Signed)
 Noted on labs from January.  High-dose, weekly vitamin D  supplementation x 12 weeks has been prescribed today.

## 2024-01-02 NOTE — Assessment & Plan Note (Signed)
 Lipid panel updated in January.  Total cholesterol 139 and LDL 83.  He is currently prescribed atorvastatin  40 mg daily.  No medication changes are indicated today.

## 2024-01-02 NOTE — Progress Notes (Signed)
 Established Patient Office Visit  Subjective   Patient ID: [name], male    DOB: 01-Aug-1956  Age: 68 y.o. MRN: [id number]  Chief Complaint  Patient presents with   Care Management    Three month follow up    [name] returns today for routine follow-up.  He was last evaluated by me on 1/28 as a new patient presenting to establish care.  No medication changes were made at that time, repeat labs ordered, and 71-month follow-up arranged.  In the interim, he has been evaluated by gastroenterology and underwent screening colonoscopy yesterday (4/28). One 8 mm polyp in the ascending colon was removed.  Pathology is pending.  There have otherwise been no acute interval events. [name] reports feeling well today.  He is asymptomatic and has no acute concerns to discuss.  Past Medical History:  Diagnosis Date   Angina    Atrial fibrillation (HCC)    Atrial flutter (HCC) 09/05/2005   h/o   Blood dyscrasia    Coronary artery disease 08/19/2011   Diabetes mellitus    diet controlled   Gilbert's syndrome    Hyperlipidemia    Hypertension    Peripheral vascular disease (HCC)    Stroke (HCC) ~08/17/11   "lost vision left eye"   Vertigo    Past Surgical History:  Procedure Laterality Date   ACHILLES TENDON REPAIR  06/2002   Left   CARDIOVERSION  2007   CAROTID ANGIOGRAM  08/19/2011   Procedure: CAROTID ANGIOGRAM;  Surgeon: Dannis Dy, MD;  Location: [facility] CATH LAB;  Service: Cardiovascular;;   CEREBRAL ANGIOGRAM N/A 08/19/2011   Procedure: CEREBRAL ANGIOGRAM;  Surgeon: Dannis Dy, MD;  Location: [facility] CATH LAB;  Service: Cardiovascular;  Laterality: N/A;   Social History   Tobacco Use   Smoking status: Some Days    Types: Cigars   Smokeless tobacco: Never  Vaping Use   Vaping status: Never Used  Substance Use Topics   Alcohol use: Yes    Alcohol/week: 4.0 standard drinks of alcohol    Types: 3 Cans of beer, 1 Shots of liquor per week   Drug use: No    Family History  Problem Relation Age of Onset   Diabetes Mother    Hyperlipidemia Mother    Hypertension Mother    Emphysema Father    Heart failure Father    Heart attack Brother    Aneurysm Sister    Deep vein thrombosis Sister    Diabetes Sister    Heart attack Sister    Allergies  Allergen Reactions   Other Other (See Comments)    Mayonnaise - irritation in nasal passages if breathe around it    Penicillins Rash   Review of Systems  Constitutional:  Negative for chills and fever.  HENT:  Negative for sore throat.   Respiratory:  Negative for cough and shortness of breath.   Cardiovascular:  Negative for chest pain, palpitations and leg swelling.  Gastrointestinal:  Negative for abdominal pain, blood in stool, constipation, diarrhea, nausea and vomiting.  Genitourinary:  Negative for dysuria and hematuria.  Musculoskeletal:  Negative for myalgias.  Skin:  Negative for itching and rash.  Neurological:  Negative for dizziness and headaches.  Psychiatric/Behavioral:  Negative for depression and suicidal ideas.      Objective:     BP 138/72   Pulse 94   Ht 6\' 1"  (1.854 m)   Wt 252 lb 9.6 oz (114.6 kg)   SpO2 98%  BMI 33.33 kg/m  BP Readings from Last 3 Encounters:  01/02/24 138/72  01/01/24 124/66  12/28/23 120/85   Physical Exam Vitals reviewed.  Constitutional:      General: He is not in acute distress.    Appearance: Normal appearance. He is obese. He is not ill-appearing.  HENT:     Head: Normocephalic and atraumatic.     Right Ear: External ear normal.     Left Ear: External ear normal.     Nose: Nose normal. No congestion or rhinorrhea.     Mouth/Throat:     Mouth: Mucous membranes are moist.     Pharynx: Oropharynx is clear.  Eyes:     General: No scleral icterus.    Extraocular Movements: Extraocular movements intact.     Conjunctiva/sclera: Conjunctivae normal.     Pupils: Pupils are equal, round, and reactive to light.  Cardiovascular:      Rate and Rhythm: Normal rate. Rhythm irregular.     Pulses: Normal pulses.     Heart sounds: Normal heart sounds. No murmur heard. Pulmonary:     Effort: Pulmonary effort is normal.     Breath sounds: Normal breath sounds. No wheezing, rhonchi or rales.  Abdominal:     General: Abdomen is flat. Bowel sounds are normal. There is no distension.     Palpations: Abdomen is soft.     Tenderness: There is no abdominal tenderness.  Musculoskeletal:        General: No swelling or deformity. Normal range of motion.     Cervical back: Normal range of motion.  Skin:    General: Skin is warm and dry.     Capillary Refill: Capillary refill takes less than 2 seconds.  Neurological:     General: No focal deficit present.     Mental Status: He is alert and oriented to person, place, and time.     Motor: No weakness.     Gait: Gait abnormal (Ambulates with a cane).  Psychiatric:        Mood and Affect: Mood normal.        Behavior: Behavior normal.        Thought Content: Thought content normal.   Last CBC Lab Results  Component Value Date   WBC 6.1 10/03/2023   HGB 13.7 10/03/2023   HCT 43.5 10/03/2023   MCV 92 10/03/2023   MCH 29.0 10/03/2023   RDW 13.4 10/03/2023   PLT 190 10/03/2023   Last metabolic panel Lab Results  Component Value Date   GLUCOSE 104 (H) 12/28/2023   NA 140 12/28/2023   K 4.5 12/28/2023   CL 106 12/28/2023   CO2 26 12/28/2023   BUN 23 12/28/2023   CREATININE 1.19 12/28/2023   GFRNONAA >60 12/28/2023   CALCIUM  10.4 (H) 12/28/2023   PROT 6.7 10/03/2023   ALBUMIN 3.8 (L) 10/03/2023   LABGLOB 2.9 10/03/2023   BILITOT 1.1 10/03/2023   ALKPHOS 79 10/03/2023   AST 19 10/03/2023   ALT 21 10/03/2023   ANIONGAP 8 12/28/2023   Last lipids Lab Results  Component Value Date   CHOL 139 10/03/2023   HDL 43 10/03/2023   LDLCALC 83 10/03/2023   TRIG 62 10/03/2023   CHOLHDL 3.2 10/03/2023   Last hemoglobin A1c Lab Results  Component Value Date   HGBA1C 5.7  (H) 10/03/2023   Last thyroid functions Lab Results  Component Value Date   TSH 2.060 10/03/2023   Last vitamin D  Lab Results  Component Value  Date   VD25OH 13.8 (L) 10/03/2023   Last vitamin B12 and Folate Lab Results  Component Value Date   VITAMINB12 447 10/03/2023   FOLATE 5.5 10/03/2023     Assessment & Plan:   Problem List Items Addressed This Visit       Chronic systolic congestive heart failure (HCC)   Remains euvolemic on exam.  He is prescribed Toprol -XL, Lasix , and lisinopril .      Hypertension, benign   Remains adequately controlled on current antihypertensive regimen.  No medication changes are indicated today.      Permanent atrial fibrillation (HCC)   Irregularly irregular rate and rhythm detected on exam again today.  He remains on Eliquis  and Toprol -XL.      Mixed hyperlipidemia   Lipid panel updated in January.  Total cholesterol 139 and LDL 83.  He is currently prescribed atorvastatin  40 mg daily.  No medication changes are indicated today.      Prediabetes   A1c 5.7 on labs from January, improved from 6.0 previously.      Vitamin D  deficiency - Primary   Noted on labs from January.  High-dose, weekly vitamin D  supplementation x 12 weeks has been prescribed today.       Return in about 6 months (around 07/03/2024).    Tobi Fortes, MD

## 2024-01-02 NOTE — Assessment & Plan Note (Signed)
 Remains euvolemic on exam.  He is prescribed Toprol -XL, Lasix , and lisinopril .

## 2024-01-02 NOTE — Assessment & Plan Note (Addendum)
 A1c 5.7 on labs from January, improved from 6.0 previously.

## 2024-01-02 NOTE — Patient Instructions (Signed)
 It was a pleasure to see you today.  Thank you for giving us  the opportunity to be involved in your care.  Below is a brief recap of your visit and next steps.  We will plan to see you again in 6 months.   Summary Add high-dose, weekly vitamin D  supplement No additional medication changes Follow up in 6 months

## 2024-01-02 NOTE — Assessment & Plan Note (Signed)
 Irregularly irregular rate and rhythm detected on exam again today.  He remains on Eliquis  and Toprol -XL.

## 2024-04-04 ENCOUNTER — Other Ambulatory Visit: Payer: Self-pay | Admitting: Cardiology

## 2024-04-18 ENCOUNTER — Emergency Department (HOSPITAL_COMMUNITY)

## 2024-04-18 ENCOUNTER — Emergency Department (HOSPITAL_COMMUNITY)
Admission: EM | Admit: 2024-04-18 | Discharge: 2024-04-18 | Disposition: A | Discharge status: Home / Self Care | Attending: Emergency Medicine | Admitting: Emergency Medicine

## 2024-04-18 ENCOUNTER — Encounter (HOSPITAL_COMMUNITY): Payer: Self-pay

## 2024-04-18 ENCOUNTER — Other Ambulatory Visit: Payer: Self-pay

## 2024-04-18 DIAGNOSIS — I110 Hypertensive heart disease with heart failure: Secondary | ICD-10-CM | POA: Insufficient documentation

## 2024-04-18 DIAGNOSIS — I771 Stricture of artery: Secondary | ICD-10-CM | POA: Diagnosis not present

## 2024-04-18 DIAGNOSIS — Z7982 Long term (current) use of aspirin: Secondary | ICD-10-CM | POA: Insufficient documentation

## 2024-04-18 DIAGNOSIS — I509 Heart failure, unspecified: Secondary | ICD-10-CM | POA: Diagnosis not present

## 2024-04-18 DIAGNOSIS — R600 Localized edema: Secondary | ICD-10-CM | POA: Diagnosis not present

## 2024-04-18 DIAGNOSIS — Z7901 Long term (current) use of anticoagulants: Secondary | ICD-10-CM | POA: Insufficient documentation

## 2024-04-18 DIAGNOSIS — I700 Atherosclerosis of aorta: Secondary | ICD-10-CM | POA: Diagnosis not present

## 2024-04-18 DIAGNOSIS — Z79899 Other long term (current) drug therapy: Secondary | ICD-10-CM | POA: Diagnosis not present

## 2024-04-18 DIAGNOSIS — R42 Dizziness and giddiness: Secondary | ICD-10-CM | POA: Diagnosis not present

## 2024-04-18 DIAGNOSIS — R079 Chest pain, unspecified: Secondary | ICD-10-CM | POA: Diagnosis not present

## 2024-04-18 DIAGNOSIS — I517 Cardiomegaly: Secondary | ICD-10-CM | POA: Diagnosis not present

## 2024-04-18 DIAGNOSIS — R0789 Other chest pain: Secondary | ICD-10-CM | POA: Diagnosis not present

## 2024-04-18 LAB — CBC WITH DIFFERENTIAL/PLATELET
Abs Immature Granulocytes: 0.02 K/uL (ref 0.00–0.07)
Basophils Absolute: 0.1 K/uL (ref 0.0–0.1)
Basophils Relative: 1 %
Eosinophils Absolute: 0.1 K/uL (ref 0.0–0.5)
Eosinophils Relative: 1 %
HCT: 45.7 % (ref 39.0–52.0)
Hemoglobin: 15.1 g/dL (ref 13.0–17.0)
Immature Granulocytes: 0 %
Lymphocytes Relative: 18 %
Lymphs Abs: 1.5 K/uL (ref 0.7–4.0)
MCH: 30.6 pg (ref 26.0–34.0)
MCHC: 33 g/dL (ref 30.0–36.0)
MCV: 92.5 fL (ref 80.0–100.0)
Monocytes Absolute: 0.6 K/uL (ref 0.1–1.0)
Monocytes Relative: 7 %
Neutro Abs: 6 K/uL (ref 1.7–7.7)
Neutrophils Relative %: 73 %
Platelets: 163 K/uL (ref 150–400)
RBC: 4.94 MIL/uL (ref 4.22–5.81)
RDW: 13.2 % (ref 11.5–15.5)
WBC: 8.2 K/uL (ref 4.0–10.5)
nRBC: 0 % (ref 0.0–0.2)

## 2024-04-18 LAB — TROPONIN I (HIGH SENSITIVITY)
Troponin I (High Sensitivity): 18 ng/L — ABNORMAL HIGH (ref <18)
Troponin I (High Sensitivity): 20 ng/L — ABNORMAL HIGH (ref <18)

## 2024-04-18 LAB — COMPREHENSIVE METABOLIC PANEL WITH GFR
ALT: 33 U/L (ref 0–44)
AST: 27 U/L (ref 15–41)
Albumin: 3.7 g/dL (ref 3.5–5.0)
Alkaline Phosphatase: 57 U/L (ref 38–126)
Anion gap: 9 (ref 5–15)
BUN: 22 mg/dL (ref 8–23)
CO2: 23 mmol/L (ref 22–32)
Calcium: 10.1 mg/dL (ref 8.9–10.3)
Chloride: 107 mmol/L (ref 98–111)
Creatinine, Ser: 1.27 mg/dL — ABNORMAL HIGH (ref 0.61–1.24)
GFR, Estimated: >60 mL/min (ref >60)
Glucose, Bld: 97 mg/dL (ref 70–99)
Potassium: 4.3 mmol/L (ref 3.5–5.1)
Sodium: 139 mmol/L (ref 135–145)
Total Bilirubin: 1.9 mg/dL — ABNORMAL HIGH (ref 0.0–1.2)
Total Protein: 7 g/dL (ref 6.5–8.1)

## 2024-04-18 MED ORDER — FUROSEMIDE 40 MG PO TABS: 40.0000 mg | ORAL_TABLET | Freq: Every day | ORAL | 0 refills | Status: DC

## 2024-04-18 MED ORDER — ASPIRIN 81 MG PO CHEW
324.0000 mg | CHEWABLE_TABLET | Freq: Once | ORAL | Status: AC
  Administered 2024-04-18: 324 mg via ORAL
  Filled 2024-04-18: qty 4

## 2024-04-18 NOTE — Discharge Instructions (Addendum)
 Thankfully your testing was unremarkable No signs of heart attack, EKG was okay, blood pressure was slightly high  Take 40 mg of furosemide  daily for 7 days, then go back to 20 mg and see your doctor in 7 days for recheck  ER for severe worsening symptoms

## 2024-04-18 NOTE — ED Provider Notes (Signed)
 [facility] Provider Note   CSN: [id number] Arrival date & time: 04/18/24  1432     Patient presents with: Dizziness   [name] is a 68 y.o. male.    Dizziness  This patient is a 68 year old male with a history of congestive heart failure and chronic atrial fibrillation on Eliquis  Lasix  and metoprolol .  He also takes lisinopril  and follows closely with Dr. Alvan with cardiology.  The patient was in his usual state of health until he opened up a letter from the IRS which told him that he owed $16,000 and back taxes.  Immediately upon hearing that news he started to develop a feeling of panic and felt discomfort in his chest and a feeling of shortness of breath.  Those symptoms seems to have stopped at this point and the patient questions whether he was just having a panic attack or something was wrong with his heart.  He denies any symptoms of being dizzy whatsoever, no vertigo, no difficulty with balance, he does walk with a cane occasionally when he feels like he is a little bit off balance but that is nothing new and nothing that changed today.  No vomiting or diarrhea, no fevers or chills, no coughing.  He has chronic lower extremity edema for which he takes furosemide , no changes recently    Prior to Admission medications   Medication Sig Start Date End Date Taking? Authorizing Provider  apixaban  (ELIQUIS ) 5 MG TABS tablet Take 1 tablet (5 mg total) by mouth 2 (two) times daily. 06/20/23  Yes BranchDorn FALCON, MD  aspirin  EC 81 MG tablet Take 162 mg by mouth every 6 (six) hours as needed (chest pain). Swallow whole.   Yes [provider]  atorvastatin  (LIPITOR) 40 MG tablet Take 1 tablet by mouth once daily 04/05/24  Yes Branch, Dorn FALCON, MD  furosemide  (LASIX ) 20 MG tablet TAKE 1 TABLET BY MOUTH ONCE DAILY. MAY TAKE AN ADDITIONAL 20 MG DAILY AS NEEDED FOR SWELLING - DOSE CHANGE Patient taking differently: Take 20 mg by  mouth daily as needed for fluid or edema. 09/18/23  Yes BranchDorn FALCON, MD  furosemide  (LASIX ) 40 MG tablet Take 1 tablet (40 mg total) by mouth daily for 7 days. 04/18/24 04/25/24 Yes Cleotilde Rogue, MD  lisinopril  (ZESTRIL ) 2.5 MG tablet Take 1 tablet by mouth once daily 08/16/23  Yes Branch, Dorn FALCON, MD  metoprolol  tartrate (LOPRESSOR ) 25 MG tablet Take 25 mg by mouth 2 (two) times daily.   Yes [provider]  Omega-3 Fatty Acids (FISH OIL) 1000 MG CAPS Take 1 capsule by mouth daily.   Yes [provider]  Vitamin D , Ergocalciferol , (DRISDOL ) 1.25 MG (50000 UNIT) CAPS capsule Take 50,000 Units by mouth every 7 (seven) days.    [provider]    Allergies: Other and Penicillins    Review of Systems  Neurological:  Positive for dizziness.  All other systems reviewed and are negative.   Updated Vital Signs BP 137/77   Pulse 79   Temp 98.4 F (36.9 C) (Oral)   Resp (!) 21   Ht 1.803 m (5' 11)   Wt 108.4 kg   SpO2 94%   BMI 33.33 kg/m   Physical Exam Vitals and nursing note reviewed.  Constitutional:      General: He is not in acute distress.    Appearance: He is well-developed.  HENT:     Head: Normocephalic and atraumatic.  Mouth/Throat:     Pharynx: No oropharyngeal exudate.  Eyes:     General: No scleral icterus.       Right eye: No discharge.        Left eye: No discharge.     Conjunctiva/sclera: Conjunctivae normal.     Pupils: Pupils are equal, round, and reactive to light.  Neck:     Thyroid: No thyromegaly.     Vascular: No JVD.  Cardiovascular:     Rate and Rhythm: Normal rate and regular rhythm.     Heart sounds: Normal heart sounds. No murmur heard.    No friction rub. No gallop.  Pulmonary:     Effort: Pulmonary effort is normal. No respiratory distress.     Breath sounds: Normal breath sounds. No wheezing or rales.  Abdominal:     General: Bowel sounds are normal. There is no distension.     Palpations: Abdomen is  soft. There is no mass.     Tenderness: There is no abdominal tenderness.  Musculoskeletal:        General: No tenderness. Normal range of motion.     Cervical back: Normal range of motion and neck supple.     Right lower leg: Edema present.     Left lower leg: Edema present.  Lymphadenopathy:     Cervical: No cervical adenopathy.  Skin:    General: Skin is warm and dry.     Findings: No erythema or rash.  Neurological:     Mental Status: He is alert.     Coordination: Coordination normal.  Psychiatric:        Behavior: Behavior normal.     (all labs ordered are listed, but only abnormal results are displayed) Labs Reviewed  COMPREHENSIVE METABOLIC PANEL WITH GFR - Abnormal; Notable for the following components:      Result Value   Creatinine, Ser 1.27 (*)    Total Bilirubin 1.9 (*)    All other components within normal limits  TROPONIN I (HIGH SENSITIVITY) - Abnormal; Notable for the following components:   Troponin I (High Sensitivity) 18 (*)    All other components within normal limits  TROPONIN I (HIGH SENSITIVITY) - Abnormal; Notable for the following components:   Troponin I (High Sensitivity) 20 (*)    All other components within normal limits  CBC WITH DIFFERENTIAL/PLATELET    EKG: EKG Interpretation Date/Time:  Thursday April 18 2024 14:44:06 EDT Ventricular Rate:  95 PR Interval:    QRS Duration:  89 QT Interval:  344 QTC Calculation: 433 R Axis:   45  Text Interpretation: Atrial fibrillation Minimal ST depression, inferior leads Confirmed by Cleotilde Rogue ([id number]) on 04/18/2024 2:48:27 PM  Radiology: [facility] Chest Port 1 View Result Date: 04/18/2024 CLINICAL DATA:  cp EXAM: PORTABLE CHEST - 1 VIEW COMPARISON:  June 03, 2015 FINDINGS: No focal airspace consolidation, pleural effusion, or pneumothorax. Moderate cardiomegaly. Tortuous aorta with aortic atherosclerosis. No acute fracture or destructive lesions. Multilevel thoracic osteophytosis. IMPRESSION:  Unchanged moderate cardiomegaly. No acute cardiopulmonary abnormality. Electronically Signed   By: Rogelia Myers M.D.   On: 04/18/2024 16:13     Procedures   Medications Ordered in the ED  aspirin  chewable tablet 324 mg (324 mg Oral Given 04/18/24 1520)    Clinical Course as of 04/18/24 1916  Thu Apr 18, 2024  1447 EKG 12-Lead [BM]    Clinical Course User Index [BM] Cleotilde Rogue, MD  Medical Decision Making Amount and/or Complexity of Data Reviewed Labs: ordered. Radiology: ordered. ECG/medicine tests:  Decision-making details documented in ED Course.  Risk OTC drugs. Prescription drug management.    This patient presents to the ED for concern of chest pain, this involves an extensive number of treatment options, and is a complaint that carries with it a high risk of complications and morbidity.  The differential diagnosis includes panic attack, anxiety, could be related to the heart under stressful conditions such as ACS, doubt PE   Co morbidities / Chronic conditions that complicate the patient evaluation  Overweight, congestive heart failure, prediabetes, hypertension, has not had any of his medications today, blood pressure is elevated   Additional history obtained:  Additional history obtained from EMR External records from outside source obtained and reviewed including medical record including recent colonoscopy, has not had a visit to the heart doctor since October of last year, follows with his family doctor closely Echocardiogram performed August 2023 showed ejection fraction of 50 to 60%, no regional wall motion abnormalities   Lab Tests:  I Ordered, and personally interpreted labs.  The pertinent results include: Troponin negative, no delta troponin change   Imaging Studies ordered:  I ordered imaging studies including chest x-ray I independently visualized and interpreted imaging which showed no significant acute  findings, moderate cardiomegaly which is unchanged I agree with the radiologist interpretation   Cardiac Monitoring: / EKG:  The patient was maintained on a cardiac monitor.  I personally viewed and interpreted the cardiac monitored which showed an underlying rhythm of: Normal sinus rhythm   Problem List / ED Course / Critical interventions / Medication management  This patient presents with findings that were more likely related to stress after opening up some concerning news from his taxes, he has no acute findings on exam, he is symptom-free at the time of discharge with 2 negative troponins  I have reviewed the patients home medicines and have made adjustments as needed    Social Determinants of Health:  None   Test / Admission - Considered:  Considered admission but negative workup, will increase Lasix  for 1 week due to some mild symmetrical lower extremity edema  I have discussed with the patient at the bedside the results, and the meaning of these results.  They have had opportunity to ask questions,  expressed their understanding to the need for follow-up with primary care physician      Final diagnoses:  Chest pain, unspecified type    ED Discharge Orders          Ordered    furosemide  (LASIX ) 40 MG tablet  Daily        04/18/24 1915               Cleotilde Rogue, MD 04/18/24 1916

## 2024-04-18 NOTE — ED Triage Notes (Signed)
 Pt BIB [facility] for dizziness. Pt said he started feeling off balance around noon. Pt is not complaining of pain or SHOB. Denies N/V. Pt takes metoprolol , but did not take this morning. Pt also takes eliquis    HR 100-170 BP160/80 CBG 103 O2 100 RA

## 2024-04-24 ENCOUNTER — Ambulatory Visit: Attending: Cardiology | Admitting: Cardiology

## 2024-04-24 ENCOUNTER — Encounter: Payer: Self-pay | Admitting: Cardiology

## 2024-04-24 VITALS — BP 116/68 | HR 70 | Ht 73.0 in | Wt 244.0 lb

## 2024-04-24 DIAGNOSIS — I6521 Occlusion and stenosis of right carotid artery: Secondary | ICD-10-CM | POA: Diagnosis not present

## 2024-04-24 DIAGNOSIS — I4821 Permanent atrial fibrillation: Secondary | ICD-10-CM | POA: Diagnosis not present

## 2024-04-24 DIAGNOSIS — G4730 Sleep apnea, unspecified: Secondary | ICD-10-CM

## 2024-04-24 DIAGNOSIS — I6522 Occlusion and stenosis of left carotid artery: Secondary | ICD-10-CM | POA: Diagnosis not present

## 2024-04-24 DIAGNOSIS — I5032 Chronic diastolic (congestive) heart failure: Secondary | ICD-10-CM

## 2024-04-24 NOTE — Progress Notes (Signed)
 Clinical Summary [name] is a 68 y.o.male seen today for follow up of the following medical problems.      1. Chronic HFimpEF - 03/2013 LVEF 40-45%.  - repeat echo 07/2015 LVEF 45-50%, no WMA  - 02/25/14 Lexiscan  MPI with fixed basal inferior defect, scar vs attenuation. LVEF 37%, basal inferior hypokinesis.   04/2022 echo: LVEF 55-60%, indet diastolic fxn.   - no SOB/DOE, mild LE edema at times.  - compliant with meds      2. Permanent afib  -denies any palpitations - compliant with meds - no bleeding on eliquis .    3. Carotid stenosis  - followed by vascular, history of LICA chronic occlusion  - 02/2019 US  LICA occluded, RICA moderate  - denies neurological symptoms   04/2022 carotid US : RICA 40-59%, LICA chronic total occlusion - 04/2023 carotid US : RICA 40-59%, LICA chronic occlusion   4. Hyperlipidemia  - compliant with statin, labs followed by pcp    - Jan 2025 TC 139 TG 62 HDL 43 LDL 83 (down 30 lbs since this check)   5. OSA screen  - + snore, + obesity. Denies somnolence. + afib, + chf  - he has not scheduled his sleep eval yet but is interested in doing.      6. CVA - stroke 08/2011,. Of not his INR was subtherapeutic at that time.   7. Chest pain - ER visit 04/18/24 with chest pain - symptoms started after opening a letter from IRS for back taxes owed - tro 18-->20. EKG no specific ischemic changes. CXR no acute process - isolated episode, no recurrence.    8. Weight loss - down 30 lbs since last year, diet     SH: son is a Charity fundraiser who works for a company that makes insulin , travelling to [place] for training. Works as Theatre manager, mainly businesses. Son now works in Engineer, building services    Past Medical History:  Diagnosis Date   Angina    Atrial fibrillation [facility])    Atrial flutter (HCC) 09/05/2005   h/o   Blood dyscrasia    Coronary artery disease 08/19/2011   Diabetes mellitus    diet controlled   Gilbert's syndrome     Hyperlipidemia    Hypertension    Peripheral vascular disease (HCC)    Stroke (HCC) ~08/17/11   lost vision left eye   Vertigo      Allergies  Allergen Reactions   Other Other (See Comments)    Mayonnaise - irritation in nasal passages if breathe around it    Penicillins Rash     Current Outpatient Medications  Medication Sig Dispense Refill   apixaban  (ELIQUIS ) 5 MG TABS tablet Take 1 tablet (5 mg total) by mouth 2 (two) times daily. 60 tablet 5   aspirin  EC 81 MG tablet Take 162 mg by mouth every 6 (six) hours as needed (chest pain). Swallow whole.     atorvastatin  (LIPITOR) 40 MG tablet Take 1 tablet by mouth once daily 90 tablet 0   furosemide  (LASIX ) 20 MG tablet TAKE 1 TABLET BY MOUTH ONCE DAILY. MAY TAKE AN ADDITIONAL 20 MG DAILY AS NEEDED FOR SWELLING - DOSE CHANGE (Patient taking differently: Take 20 mg by mouth daily as needed for fluid or edema.) 180 tablet 0   furosemide  (LASIX ) 40 MG tablet Take 1 tablet (40 mg total) by mouth daily for 7 days. 7 tablet 0   lisinopril  (ZESTRIL ) 2.5 MG tablet Take 1 tablet  by mouth once daily 90 tablet 1   metoprolol  tartrate (LOPRESSOR ) 25 MG tablet Take 25 mg by mouth 2 (two) times daily.     Omega-3 Fatty Acids (FISH OIL) 1000 MG CAPS Take 1 capsule by mouth daily.     Vitamin D , Ergocalciferol , (DRISDOL ) 1.25 MG (50000 UNIT) CAPS capsule Take 50,000 Units by mouth every 7 (seven) days.     No current facility-administered medications for this visit.     Past Surgical History:  Procedure Laterality Date   ACHILLES TENDON REPAIR  06/2002   Left   CARDIOVERSION  2007   CAROTID ANGIOGRAM  08/19/2011   Procedure: CAROTID ANGIOGRAM;  Surgeon: Lonni GORMAN Blade, MD;  Location: [facility] CATH LAB;  Service: Cardiovascular;;   CEREBRAL ANGIOGRAM N/A 08/19/2011   Procedure: CEREBRAL ANGIOGRAM;  Surgeon: Lonni GORMAN Blade, MD;  Location: [facility] CATH LAB;  Service: Cardiovascular;  Laterality: N/A;   COLONOSCOPY N/A 01/01/2024    Procedure: COLONOSCOPY;  Surgeon: Cindie Carlin POUR, DO;  Location: [facility] ENDO SUITE;  Service: Endoscopy;  Laterality: N/A;  730am, asa 3     Allergies  Allergen Reactions   Other Other (See Comments)    Mayonnaise - irritation in nasal passages if breathe around it    Penicillins Rash      Family History  Problem Relation Age of Onset   Diabetes Mother    Hyperlipidemia Mother    Hypertension Mother    Emphysema Father    Heart failure Father    Heart attack Brother    Aneurysm Sister    Deep vein thrombosis Sister    Diabetes Sister    Heart attack Sister      Social History [name] reports that he has been smoking cigars. He has never used smokeless tobacco. [name] reports current alcohol use of about 4.0 standard drinks of alcohol per week.    Physical Examination Today's Vitals   04/24/24 0839  BP: 116/68  Pulse: 70  SpO2: 100%  Weight: 244 lb (110.7 kg)  Height: 6' 1 (1.854 m)   Body mass index is 32.19 kg/m.  Gen: resting comfortably, no acute distress HEENT: no scleral icterus, pupils equal round and reactive, no palptable cervical adenopathy,  CV: RRR, no m/rg, no jvd Resp: Clear to auscultation bilaterally GI: abdomen is soft, non-tender, non-distended, normal bowel sounds, no hepatosplenomegaly MSK: extremities are warm, tracee bilateral edema Skin: warm, no rash Neuro:  no focal deficits Psych: appropriate affect   Diagnostic Studies  03/2013 Echo   Study Conclusions  - Left ventricle: Technically difficult study. There was mild concentric hypertrophy. Systolic function was mildly to moderately reduced. The estimated ejection fraction was in the range of 40% to 45%. Diffuse hypokinesis. The study is not technically sufficient to allow evaluation of LV diastolic function. - Mitral valve: Mildly thickened leaflets . - Left atrium: Moderately dilated. - Right atrium: The atrium was mildly dilated.  02/2014 Lexiscan  MPI   Analysis of  the raw data did not demonstrate any significant   extracardiac radiotracer uptake. Analysis of the perfusion images   demonstrated a moderate size, moderately intense, basilar inferior   wall defect. Images were worse on rest than stress. Inferior soft   tissue attenuation also noted. Left ventricular systolic function   was moderately reduced, calculated LV EF 37%. The aforementioned   wall is hypokinetic.   IMPRESSION:   1. Abnormal exercise Cardiolite  stress test.   2. Rapid atrial fibrillation noted with exercise.   3.  Fixed basal inferior wall defect. While this may represent   myocardial scar, interfering soft tissue attenuation cannot entirely   be excluded.   4. Moderately reduced LV systolic function, calculated LVEF 37%.   07/2015 echo Study Conclusions  - Left ventricle: The cavity size was normal. Wall thickness was   normal. Systolic function was mildly reduced. The estimated   ejection fraction was in the range of 45% to 50%. Wall motion was   normal; there were no regional wall motion abnormalities. - Aortic valve: Valve area (VTI): 2.39 cm^2. Valve area (Vmax):   2.29 cm^2. - Mitral valve: There was mild regurgitation. - Left atrium: The atrium was severely dilated. - Right atrium: The atrium was severely dilated. - Atrial septum: No defect or patent foramen ovale was identified. - Technically adequate study.     11/2015 Carotid US  IMPRESSION: Complete occlusion of the left internal carotid artery near its origin.   Moderate eccentric calcified plaque is noted in the right carotid bulb and proximal right internal carotid artery consistent with 50-69% stenosis based on ultrasound and Doppler criteria.   02/2019 Carotid US  Summary:  Right Carotid: Velocities in the right ICA are consistent with a 40-59%                 stenosis.     Vertebrals:  Bilateral vertebral arteries demonstrate antegrade flow.  Subclavians: Normal flow hemodynamics were seen in  bilateral subclavian               arteries.      04/2022 echo 1. Left ventricular ejection fraction, by estimation, is 55 to 60%. The  left ventricle has normal function. The left ventricle has no regional  wall motion abnormalities. Left ventricular diastolic parameters are  indeterminate.   2. Right ventricular systolic function is normal. The right ventricular  size is normal. Tricuspid regurgitation signal is inadequate for assessing  PA pressure.   3. Left atrial size was severely dilated.   4. The mitral valve is abnormal. Mild mitral valve regurgitation. No  evidence of mitral stenosis.   5. The aortic valve is tricuspid. Aortic valve regurgitation is not  visualized. No aortic stenosis is present.   6. The inferior vena cava is normal in size with greater than 50%  respiratory variability, suggesting right atrial pressure of 3 mmHg.      04/2022 carotid US  Summary:  Right Carotid: Velocities in the right ICA are consistent with a 40-59%                 stenosis.   Left Carotid: Evidence consistent with a total occlusion of the left ICA.   Vertebrals:  Bilateral vertebral arteries demonstrate antegrade flow.  Subclavians: Normal flow hemodynamics were seen in bilateral subclavian               arteries.      Assessment and Plan   1. Chronic HFimpEF -- LVEF has normalized -no recent symptmos, continue current meds   2. Afib /acquired thrombophilia -no recent symptoms, continue current meds including eliquis  for stroke prvention   3. Carotid stenosis   -repeat carotid US    4. HTN - at goal,continue current meds   5. OSA screen - signs and symptoms of OSA, refer to pulmonary   6. HLD - LDL above goal, has lost about 30 lbs since last year. Follow labs for now, goal LDL would be <70   7. Chest pain - isolated episode in setting of severe  stress, monitor at this time       Dorn PHEBE Ross, M.D.

## 2024-04-24 NOTE — Patient Instructions (Signed)
 Medication Instructions:   Continue all current medications.   Labwork:  none  Testing/Procedures:  Your physician has requested that you have a carotid duplex. This test is an ultrasound of the carotid arteries in your neck. It looks at blood flow through these arteries that supply the brain with blood. Allow one hour for this exam. There are no restrictions or special instructions.  Office will contact with results via phone, letter or mychart.     Follow-Up:  6 months   Any Other Special Instructions Will Be Listed Below (If Applicable).  You have been referred to:  Pulmonary   If you need a refill on your cardiac medications before your next appointment, please call your pharmacy.

## 2024-05-27 ENCOUNTER — Ambulatory Visit: Attending: Cardiology

## 2024-05-27 DIAGNOSIS — I6521 Occlusion and stenosis of right carotid artery: Secondary | ICD-10-CM

## 2024-05-27 DIAGNOSIS — I6522 Occlusion and stenosis of left carotid artery: Secondary | ICD-10-CM

## 2024-05-30 ENCOUNTER — Ambulatory Visit: Payer: Self-pay | Admitting: Cardiology

## 2024-05-30 NOTE — Telephone Encounter (Signed)
-----   Message from [place] sent at 05/30/2024  1:02 PM EDT ----- Moderate plaque on the right, the left has been chronically blocked. Continue to monitor at this time  JINNY Ross MD ----- Message ----- From: [facility], [facility] Sent: 05/27/2024   8:58 AM EDT To: Dorn JULIANNA Ross, MD

## 2024-05-30 NOTE — Telephone Encounter (Signed)
 Notified, copy to pcp.

## 2024-06-05 ENCOUNTER — Other Ambulatory Visit: Payer: Self-pay | Admitting: Cardiology

## 2024-06-26 ENCOUNTER — Other Ambulatory Visit: Payer: Self-pay | Admitting: Cardiology

## 2024-06-27 ENCOUNTER — Other Ambulatory Visit: Payer: Self-pay | Admitting: Cardiology

## 2024-06-27 NOTE — Telephone Encounter (Signed)
 Prescription refill request for Eliquis  received. Indication:afib Last office visit:8/25 Scr:1.27  8/25 Age: 68 Weight:110.7  kg  Prescription refilled

## 2024-07-04 ENCOUNTER — Other Ambulatory Visit: Payer: Self-pay | Admitting: Cardiology

## 2024-07-04 ENCOUNTER — Ambulatory Visit

## 2024-07-04 NOTE — Telephone Encounter (Signed)
 Prescription refill request for Eliquis  received. Indication:afib Last office visit:8/25 Scr:1.27  8/25 Age: 68 Weight:110.7  kg  Prescription refilled

## 2024-07-28 NOTE — Progress Notes (Deleted)
 New Patient Pulmonology Office Visit   Subjective:  Patient ID: [name], male    DOB: 1956-01-26  MRN: [id number]  Referred by: Alvan Dorn FALCON, MD  CC: No chief complaint on file.   HPI [name] is a 68 y.o. male with hx of HTN, HLD, CVA, PVD, chronic diastolic heart failure and atrial fibrillation who presents for initial evaluation of sleep disordered breathing.  The Epworth Sleepiness score is ***/24.   {STOPBANG:33649}   {PULM QUESTIONNAIRES (Optional):33196}  ROS  Allergies: Other and Penicillins  Current Outpatient Medications:    apixaban  (ELIQUIS ) 5 MG TABS tablet, Take 1 tablet by mouth twice daily, Disp: 60 tablet, Rfl: 5   atorvastatin  (LIPITOR) 40 MG tablet, Take 1 tablet by mouth once daily, Disp: 90 tablet, Rfl: 0   furosemide  (LASIX ) 20 MG tablet, TAKE 1 TABLET BY MOUTH ONCE DAILY. MAY TAKE AN ADDITIONAL 20 MG DAILY AS NEEDED FOR SWELLING - DOSE CHANGE, Disp: 180 tablet, Rfl: 0   lisinopril  (ZESTRIL ) 2.5 MG tablet, Take 1 tablet by mouth once daily, Disp: 90 tablet, Rfl: 1   metoprolol  tartrate (LOPRESSOR ) 25 MG tablet, Take 25 mg by mouth 2 (two) times daily., Disp: , Rfl:    Omega-3 Fatty Acids (FISH OIL) 1000 MG CAPS, Take 1 capsule by mouth daily., Disp: , Rfl:    Vitamin D , Ergocalciferol , (DRISDOL ) 1.25 MG (50000 UNIT) CAPS capsule, Take 50,000 Units by mouth every 7 (seven) days., Disp: , Rfl:  Past Medical History:  Diagnosis Date   Angina    Atrial fibrillation (HCC)    Atrial flutter (HCC) 09/05/2005   h/o   Blood dyscrasia    Coronary artery disease 08/19/2011   Diabetes mellitus    diet controlled   Gilbert's syndrome    Hyperlipidemia    Hypertension    Peripheral vascular disease    Stroke (HCC) ~08/17/11   lost vision left eye   Vertigo    Past Surgical History:  Procedure Laterality Date   ACHILLES TENDON REPAIR  06/2002   Left   CARDIOVERSION  2007   CAROTID ANGIOGRAM  08/19/2011   Procedure: CAROTID  ANGIOGRAM;  Surgeon: Lonni GORMAN Blade, MD;  Location: [facility] CATH LAB;  Service: Cardiovascular;;   CEREBRAL ANGIOGRAM N/A 08/19/2011   Procedure: CEREBRAL ANGIOGRAM;  Surgeon: Lonni GORMAN Blade, MD;  Location: [facility] CATH LAB;  Service: Cardiovascular;  Laterality: N/A;   COLONOSCOPY N/A 01/01/2024   Procedure: COLONOSCOPY;  Surgeon: Cindie Carlin POUR, DO;  Location: [facility] END[facility] SUITE;  Service: Endoscopy;  Laterality: N/A;  730am, asa 3   Family History  Problem Relation Age of Onset   Diabetes Mother    Hyperlipidemia Mother    Hypertension Mother    Emphysema Father    Heart failure Father    Heart attack Brother    Aneurysm Sister    Deep vein thrombosis Sister    Diabetes Sister    Heart attack Sister    Social History   Socioeconomic History   Marital status: Married    Spouse name: Not on file   Number of children: Not on file   Years of education: Not on file   Highest education level: Not on file  Occupational History   Not on file  Tobacco Use   Smoking status: Some Days    Types: Cigars   Smokeless tobacco: Never  Vaping Use   Vaping status: Never Used  Substance and Sexual Activity   Alcohol use: Yes  Alcohol/week: 4.0 standard drinks of alcohol    Types: 3 Cans of beer, 1 Shots of liquor per week   Drug use: No   Sexual activity: Yes    Birth control/protection: Condom  Other Topics Concern   Not on file  Social History Narrative   Not on file   Social Drivers of Health   Financial Resource Strain: Patient Declined (12/06/2023)   Overall Financial Resource Strain (CARDIA)    Difficulty of Paying Living Expenses: Patient declined  Food Insecurity: No Food Insecurity (12/06/2023)   Hunger Vital Sign    Worried About Running Out of Food in the Last Year: Never true    Ran Out of Food in the Last Year: Never true  Transportation Needs: No Transportation Needs (12/06/2023)   PRAPARE - Administrator, Civil Service (Medical): No    Lack of  Transportation (Non-Medical): No  Physical Activity: Sufficiently Active (12/06/2023)   Exercise Vital Sign    Days of Exercise per Week: 7 days    Minutes of Exercise per Session: 30 min  Stress: No Stress Concern Present (12/06/2023)   [employer] of Occupational Health - Occupational Stress Questionnaire    Feeling of Stress : Not at all  Social Connections: Socially Integrated (12/06/2023)   Social Connection and Isolation Panel    Frequency of Communication with Friends and Family: More than three times a week    Frequency of Social Gatherings with Friends and Family: More than three times a week    Attends Religious Services: More than 4 times per year    Active Member of [employer] or Organizations: Yes    Attends Banker Meetings: More than 4 times per year    Marital Status: Married  Catering Manager Violence: Not At Risk (12/06/2023)   Humiliation, Afraid, Rape, and Kick questionnaire    Fear of Current or Ex-Partner: No    Emotionally Abused: No    Physically Abused: No    Sexually Abused: No       Objective:  There were no vitals taken for this visit. {Pulm Vitals (Optional):32837}  Physical Exam  Diagnostic Review:  {Labs (Optional):32838}     Assessment & Plan:   Assessment & Plan   No orders of the defined types were placed in this encounter.     No follow-ups on file.   Aigner Horseman, MD

## 2024-07-29 ENCOUNTER — Ambulatory Visit: Admitting: Pulmonary Disease

## 2024-07-31 ENCOUNTER — Other Ambulatory Visit: Payer: Self-pay | Admitting: Cardiology

## 2024-09-17 ENCOUNTER — Other Ambulatory Visit: Payer: Self-pay | Admitting: Cardiology

## 2024-09-19 ENCOUNTER — Other Ambulatory Visit: Payer: Self-pay | Admitting: Cardiology

## 2024-09-20 NOTE — Telephone Encounter (Signed)
 Pt of Dr. Alvan  In accordance with refill protocols, please review and address the following requirements before this medication refill can be authorized:  Labs

## 2024-10-31 ENCOUNTER — Encounter: Payer: Self-pay | Admitting: Family Medicine

## 2024-12-10 ENCOUNTER — Ambulatory Visit
